# Patient Record
Sex: Female | Born: 1960 | Race: Black or African American | Hispanic: No | Marital: Married | Smoking: Never smoker
Health system: Southern US, Community
[De-identification: ages and names within clinical notes are randomized; demographics above are authoritative.]

## PROBLEM LIST (undated history)

## (undated) DIAGNOSIS — N809 Endometriosis, unspecified: Secondary | ICD-10-CM

## (undated) DIAGNOSIS — G629 Polyneuropathy, unspecified: Secondary | ICD-10-CM

## (undated) DIAGNOSIS — M47816 Spondylosis without myelopathy or radiculopathy, lumbar region: Secondary | ICD-10-CM

## (undated) DIAGNOSIS — M545 Low back pain, unspecified: Secondary | ICD-10-CM

## (undated) DIAGNOSIS — M797 Fibromyalgia: Secondary | ICD-10-CM

## (undated) DIAGNOSIS — R569 Unspecified convulsions: Secondary | ICD-10-CM

## (undated) DIAGNOSIS — M7918 Myalgia, other site: Secondary | ICD-10-CM

## (undated) DIAGNOSIS — T4145XA Adverse effect of unspecified anesthetic, initial encounter: Secondary | ICD-10-CM

## (undated) DIAGNOSIS — E079 Disorder of thyroid, unspecified: Secondary | ICD-10-CM

## (undated) DIAGNOSIS — K589 Irritable bowel syndrome without diarrhea: Secondary | ICD-10-CM

## (undated) DIAGNOSIS — G9332 Myalgic encephalomyelitis/chronic fatigue syndrome: Secondary | ICD-10-CM

## (undated) DIAGNOSIS — H16399 Other interstitial and deep keratitis, unspecified eye: Secondary | ICD-10-CM

## (undated) DIAGNOSIS — T8859XA Other complications of anesthesia, initial encounter: Secondary | ICD-10-CM

## (undated) DIAGNOSIS — N3010 Interstitial cystitis (chronic) without hematuria: Secondary | ICD-10-CM

## (undated) DIAGNOSIS — M51369 Other intervertebral disc degeneration, lumbar region without mention of lumbar back pain or lower extremity pain: Secondary | ICD-10-CM

## (undated) DIAGNOSIS — G8929 Other chronic pain: Secondary | ICD-10-CM

## (undated) DIAGNOSIS — G9050 Complex regional pain syndrome I, unspecified: Secondary | ICD-10-CM

## (undated) DIAGNOSIS — Z973 Presence of spectacles and contact lenses: Secondary | ICD-10-CM

## (undated) DIAGNOSIS — G90521 Complex regional pain syndrome I of right lower limb: Secondary | ICD-10-CM

## (undated) DIAGNOSIS — E039 Hypothyroidism, unspecified: Secondary | ICD-10-CM

## (undated) DIAGNOSIS — R5382 Chronic fatigue, unspecified: Secondary | ICD-10-CM

## (undated) DIAGNOSIS — Z8489 Family history of other specified conditions: Secondary | ICD-10-CM

## (undated) DIAGNOSIS — J45909 Unspecified asthma, uncomplicated: Secondary | ICD-10-CM

## (undated) DIAGNOSIS — G4730 Sleep apnea, unspecified: Secondary | ICD-10-CM

## (undated) DIAGNOSIS — I201 Angina pectoris with documented spasm: Secondary | ICD-10-CM

## (undated) DIAGNOSIS — M5136 Other intervertebral disc degeneration, lumbar region: Secondary | ICD-10-CM

## (undated) DIAGNOSIS — Z993 Dependence on wheelchair: Secondary | ICD-10-CM

## (undated) HISTORY — DX: Other chronic pain: G89.29

## (undated) HISTORY — DX: Myalgia, other site: M79.18

## (undated) HISTORY — DX: Sleep apnea, unspecified: G47.30

## (undated) HISTORY — DX: Other intervertebral disc degeneration, lumbar region: M51.36

## (undated) HISTORY — DX: Irritable bowel syndrome, unspecified: K58.9

## (undated) HISTORY — DX: Low back pain: M54.5

## (undated) HISTORY — PX: CARDIAC CATHETERIZATION: SHX172

## (undated) HISTORY — PX: KNEE SURGERY: SHX244

## (undated) HISTORY — DX: Other intervertebral disc degeneration, lumbar region without mention of lumbar back pain or lower extremity pain: M51.369

## (undated) HISTORY — PX: ABDOMINAL SURGERY: SHX537

## (undated) HISTORY — PX: JOINT REPLACEMENT: SHX530

## (undated) HISTORY — DX: Hypothyroidism, unspecified: E03.9

## (undated) HISTORY — DX: Low back pain, unspecified: M54.50

## (undated) HISTORY — DX: Spondylosis without myelopathy or radiculopathy, lumbar region: M47.816

## (undated) HISTORY — DX: Complex regional pain syndrome i of right lower limb: G90.521

## (undated) HISTORY — DX: Unspecified asthma, uncomplicated: J45.909

---
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
## 1981-12-13 HISTORY — PX: GYNECOLOGIC CRYOSURGERY: SHX857

## 1985-12-13 HISTORY — PX: CHOLECYSTECTOMY: SHX55

## 1988-12-13 HISTORY — PX: TUBAL LIGATION: SHX77

## 1989-12-13 HISTORY — PX: KNEE ARTHROSCOPY: SUR90

## 1990-12-13 HISTORY — PX: ABDOMINAL HYSTERECTOMY: SHX81

## 1995-12-14 DIAGNOSIS — M7918 Myalgia, other site: Secondary | ICD-10-CM

## 1995-12-14 DIAGNOSIS — M797 Fibromyalgia: Secondary | ICD-10-CM

## 1995-12-14 DIAGNOSIS — G8929 Other chronic pain: Secondary | ICD-10-CM

## 1995-12-14 HISTORY — PX: PUBOVAGINAL SLING SYNTHETIC: SUR1059

## 1995-12-14 HISTORY — DX: Myalgia, other site: M79.18

## 1995-12-14 HISTORY — PX: BLADDER SURGERY: SHX569

## 1995-12-14 HISTORY — DX: Other chronic pain: G89.29

## 1995-12-14 HISTORY — DX: Fibromyalgia: M79.7

## 1999-03-05 ENCOUNTER — Ambulatory Visit (HOSPITAL_COMMUNITY): Admission: RE | Admit: 1999-03-05 | Discharge: 1999-03-05 | Payer: Self-pay | Admitting: Internal Medicine

## 1999-07-08 ENCOUNTER — Encounter: Payer: Self-pay | Admitting: Urology

## 1999-07-10 ENCOUNTER — Observation Stay (HOSPITAL_COMMUNITY): Admission: RE | Admit: 1999-07-10 | Discharge: 1999-07-11 | Payer: Self-pay | Admitting: Urology

## 1999-11-18 ENCOUNTER — Encounter: Admission: RE | Admit: 1999-11-18 | Discharge: 1999-11-18 | Payer: Self-pay | Admitting: Internal Medicine

## 1999-11-18 ENCOUNTER — Encounter: Payer: Self-pay | Admitting: Internal Medicine

## 1999-12-14 HISTORY — PX: BUNIONECTOMY: SHX129

## 2000-01-25 ENCOUNTER — Encounter: Payer: Self-pay | Admitting: Orthopaedic Surgery

## 2000-01-25 ENCOUNTER — Encounter: Admission: RE | Admit: 2000-01-25 | Discharge: 2000-01-25 | Payer: Self-pay | Admitting: Orthopaedic Surgery

## 2000-01-26 ENCOUNTER — Ambulatory Visit (HOSPITAL_BASED_OUTPATIENT_CLINIC_OR_DEPARTMENT_OTHER): Admission: RE | Admit: 2000-01-26 | Discharge: 2000-01-26 | Payer: Self-pay | Admitting: Orthopaedic Surgery

## 2000-01-28 ENCOUNTER — Emergency Department (HOSPITAL_COMMUNITY): Admission: EM | Admit: 2000-01-28 | Discharge: 2000-01-28 | Payer: Self-pay | Admitting: Emergency Medicine

## 2000-02-14 ENCOUNTER — Ambulatory Visit: Admission: RE | Admit: 2000-02-14 | Discharge: 2000-02-14 | Payer: Self-pay | Admitting: Internal Medicine

## 2000-04-27 ENCOUNTER — Other Ambulatory Visit: Admission: RE | Admit: 2000-04-27 | Discharge: 2000-04-27 | Payer: Self-pay | Admitting: Obstetrics and Gynecology

## 2000-09-05 ENCOUNTER — Encounter: Payer: Self-pay | Admitting: Urology

## 2000-09-09 ENCOUNTER — Observation Stay (HOSPITAL_COMMUNITY): Admission: RE | Admit: 2000-09-09 | Discharge: 2000-09-10 | Payer: Self-pay | Admitting: Urology

## 2000-12-13 DIAGNOSIS — G90521 Complex regional pain syndrome I of right lower limb: Secondary | ICD-10-CM

## 2000-12-13 HISTORY — DX: Complex regional pain syndrome i of right lower limb: G90.521

## 2001-05-08 ENCOUNTER — Other Ambulatory Visit: Admission: RE | Admit: 2001-05-08 | Discharge: 2001-05-08 | Payer: Self-pay | Admitting: Obstetrics and Gynecology

## 2001-07-13 ENCOUNTER — Encounter: Payer: Self-pay | Admitting: Emergency Medicine

## 2001-07-13 ENCOUNTER — Emergency Department (HOSPITAL_COMMUNITY): Admission: EM | Admit: 2001-07-13 | Discharge: 2001-07-13 | Payer: Self-pay | Admitting: Emergency Medicine

## 2001-08-25 ENCOUNTER — Encounter: Admission: RE | Admit: 2001-08-25 | Discharge: 2001-11-23 | Payer: Self-pay | Admitting: Orthopaedic Surgery

## 2002-08-13 ENCOUNTER — Emergency Department (HOSPITAL_COMMUNITY): Admission: EM | Admit: 2002-08-13 | Discharge: 2002-08-13 | Payer: Self-pay | Admitting: *Deleted

## 2002-10-01 ENCOUNTER — Other Ambulatory Visit: Admission: RE | Admit: 2002-10-01 | Discharge: 2002-10-01 | Payer: Self-pay | Admitting: Obstetrics and Gynecology

## 2002-10-10 ENCOUNTER — Encounter: Payer: Self-pay | Admitting: Obstetrics and Gynecology

## 2002-10-10 ENCOUNTER — Ambulatory Visit (HOSPITAL_COMMUNITY): Admission: RE | Admit: 2002-10-10 | Discharge: 2002-10-10 | Payer: Self-pay | Admitting: Obstetrics and Gynecology

## 2002-10-19 ENCOUNTER — Inpatient Hospital Stay (HOSPITAL_COMMUNITY): Admission: AD | Admit: 2002-10-19 | Discharge: 2002-10-21 | Payer: Self-pay | Admitting: Internal Medicine

## 2002-10-19 ENCOUNTER — Encounter: Payer: Self-pay | Admitting: Internal Medicine

## 2002-10-22 ENCOUNTER — Inpatient Hospital Stay (HOSPITAL_COMMUNITY): Admission: AD | Admit: 2002-10-22 | Discharge: 2002-10-30 | Payer: Self-pay | Admitting: Internal Medicine

## 2002-10-23 ENCOUNTER — Encounter: Payer: Self-pay | Admitting: Internal Medicine

## 2002-10-23 ENCOUNTER — Encounter (INDEPENDENT_AMBULATORY_CARE_PROVIDER_SITE_OTHER): Payer: Self-pay | Admitting: Cardiology

## 2002-10-24 ENCOUNTER — Encounter: Payer: Self-pay | Admitting: Internal Medicine

## 2002-10-29 ENCOUNTER — Encounter: Payer: Self-pay | Admitting: Internal Medicine

## 2002-11-06 ENCOUNTER — Encounter: Admission: RE | Admit: 2002-11-06 | Discharge: 2002-11-06 | Payer: Self-pay | Admitting: Internal Medicine

## 2002-11-06 ENCOUNTER — Encounter: Payer: Self-pay | Admitting: Internal Medicine

## 2002-11-19 ENCOUNTER — Inpatient Hospital Stay (HOSPITAL_COMMUNITY): Admission: EM | Admit: 2002-11-19 | Discharge: 2002-11-30 | Payer: Self-pay | Admitting: Emergency Medicine

## 2002-11-19 ENCOUNTER — Encounter: Payer: Self-pay | Admitting: Internal Medicine

## 2003-01-14 ENCOUNTER — Inpatient Hospital Stay (HOSPITAL_COMMUNITY): Admission: EM | Admit: 2003-01-14 | Discharge: 2003-01-22 | Payer: Self-pay | Admitting: Emergency Medicine

## 2003-01-14 ENCOUNTER — Encounter: Payer: Self-pay | Admitting: Emergency Medicine

## 2003-05-30 ENCOUNTER — Ambulatory Visit (HOSPITAL_BASED_OUTPATIENT_CLINIC_OR_DEPARTMENT_OTHER): Admission: RE | Admit: 2003-05-30 | Discharge: 2003-05-30 | Payer: Self-pay | Admitting: Urology

## 2003-06-07 ENCOUNTER — Observation Stay (HOSPITAL_COMMUNITY): Admission: RE | Admit: 2003-06-07 | Discharge: 2003-06-08 | Payer: Self-pay | Admitting: Urology

## 2004-02-14 ENCOUNTER — Emergency Department (HOSPITAL_COMMUNITY): Admission: EM | Admit: 2004-02-14 | Discharge: 2004-02-14 | Payer: Self-pay | Admitting: Emergency Medicine

## 2005-01-01 ENCOUNTER — Ambulatory Visit (HOSPITAL_COMMUNITY): Admission: RE | Admit: 2005-01-01 | Discharge: 2005-01-01 | Payer: Self-pay | Admitting: Urology

## 2005-01-01 ENCOUNTER — Observation Stay (HOSPITAL_COMMUNITY): Admission: AD | Admit: 2005-01-01 | Discharge: 2005-01-02 | Payer: Self-pay | Admitting: Urology

## 2005-01-01 ENCOUNTER — Ambulatory Visit (HOSPITAL_BASED_OUTPATIENT_CLINIC_OR_DEPARTMENT_OTHER): Admission: RE | Admit: 2005-01-01 | Discharge: 2005-01-01 | Payer: Self-pay | Admitting: Urology

## 2005-01-28 ENCOUNTER — Ambulatory Visit: Payer: Self-pay | Admitting: Internal Medicine

## 2005-02-09 ENCOUNTER — Ambulatory Visit (HOSPITAL_COMMUNITY): Admission: RE | Admit: 2005-02-09 | Discharge: 2005-02-09 | Payer: Self-pay | Admitting: Obstetrics and Gynecology

## 2005-02-15 ENCOUNTER — Ambulatory Visit: Payer: Self-pay | Admitting: Internal Medicine

## 2005-07-29 ENCOUNTER — Ambulatory Visit: Payer: Self-pay | Admitting: Internal Medicine

## 2005-12-27 ENCOUNTER — Ambulatory Visit: Payer: Self-pay | Admitting: Internal Medicine

## 2006-02-11 ENCOUNTER — Ambulatory Visit (HOSPITAL_COMMUNITY): Admission: RE | Admit: 2006-02-11 | Discharge: 2006-02-11 | Payer: Self-pay | Admitting: Obstetrics and Gynecology

## 2006-04-28 ENCOUNTER — Ambulatory Visit: Payer: Self-pay | Admitting: Internal Medicine

## 2006-08-19 ENCOUNTER — Observation Stay (HOSPITAL_COMMUNITY): Admission: EM | Admit: 2006-08-19 | Discharge: 2006-08-20 | Payer: Self-pay | Admitting: Urology

## 2006-10-25 ENCOUNTER — Ambulatory Visit: Payer: Self-pay | Admitting: Internal Medicine

## 2007-04-27 ENCOUNTER — Ambulatory Visit: Payer: Self-pay | Admitting: Internal Medicine

## 2007-04-28 ENCOUNTER — Ambulatory Visit: Payer: Self-pay | Admitting: Internal Medicine

## 2007-09-27 ENCOUNTER — Ambulatory Visit (HOSPITAL_COMMUNITY): Admission: RE | Admit: 2007-09-27 | Discharge: 2007-09-27 | Payer: Self-pay | Admitting: Obstetrics and Gynecology

## 2007-11-02 DIAGNOSIS — K219 Gastro-esophageal reflux disease without esophagitis: Secondary | ICD-10-CM | POA: Insufficient documentation

## 2007-11-02 DIAGNOSIS — J383 Other diseases of vocal cords: Secondary | ICD-10-CM

## 2007-11-02 DIAGNOSIS — F411 Generalized anxiety disorder: Secondary | ICD-10-CM | POA: Insufficient documentation

## 2007-11-02 DIAGNOSIS — J45909 Unspecified asthma, uncomplicated: Secondary | ICD-10-CM | POA: Insufficient documentation

## 2007-11-02 DIAGNOSIS — R0789 Other chest pain: Secondary | ICD-10-CM

## 2007-11-03 ENCOUNTER — Ambulatory Visit (HOSPITAL_BASED_OUTPATIENT_CLINIC_OR_DEPARTMENT_OTHER): Admission: RE | Admit: 2007-11-03 | Discharge: 2007-11-03 | Payer: Self-pay | Admitting: Urology

## 2007-12-13 ENCOUNTER — Ambulatory Visit: Payer: Self-pay | Admitting: Internal Medicine

## 2007-12-13 DIAGNOSIS — L272 Dermatitis due to ingested food: Secondary | ICD-10-CM | POA: Insufficient documentation

## 2007-12-28 ENCOUNTER — Ambulatory Visit: Payer: Self-pay | Admitting: Internal Medicine

## 2008-03-21 ENCOUNTER — Ambulatory Visit (HOSPITAL_COMMUNITY): Admission: RE | Admit: 2008-03-21 | Discharge: 2008-03-21 | Payer: Self-pay | Admitting: Gastroenterology

## 2008-06-06 ENCOUNTER — Ambulatory Visit: Payer: Self-pay | Admitting: Internal Medicine

## 2008-06-18 ENCOUNTER — Encounter (INDEPENDENT_AMBULATORY_CARE_PROVIDER_SITE_OTHER): Payer: Self-pay | Admitting: *Deleted

## 2008-08-31 ENCOUNTER — Emergency Department (HOSPITAL_COMMUNITY): Admission: EM | Admit: 2008-08-31 | Discharge: 2008-08-31 | Payer: Self-pay | Admitting: Emergency Medicine

## 2008-09-05 ENCOUNTER — Ambulatory Visit (HOSPITAL_COMMUNITY): Admission: RE | Admit: 2008-09-05 | Discharge: 2008-09-05 | Payer: Self-pay | Admitting: Internal Medicine

## 2008-09-18 ENCOUNTER — Ambulatory Visit (HOSPITAL_COMMUNITY): Admission: RE | Admit: 2008-09-18 | Discharge: 2008-09-18 | Payer: Self-pay | Admitting: Gastroenterology

## 2008-09-27 ENCOUNTER — Inpatient Hospital Stay (HOSPITAL_COMMUNITY): Admission: AD | Admit: 2008-09-27 | Discharge: 2008-10-05 | Payer: Self-pay | Admitting: Internal Medicine

## 2008-09-27 ENCOUNTER — Encounter: Payer: Self-pay | Admitting: Internal Medicine

## 2008-09-30 ENCOUNTER — Encounter: Payer: Self-pay | Admitting: Internal Medicine

## 2008-10-02 ENCOUNTER — Ambulatory Visit (HOSPITAL_COMMUNITY): Admission: RE | Admit: 2008-10-02 | Discharge: 2008-10-02 | Payer: Self-pay | Admitting: Cardiology

## 2008-10-15 ENCOUNTER — Encounter: Admission: RE | Admit: 2008-10-15 | Discharge: 2008-10-15 | Payer: Self-pay | Admitting: Gastroenterology

## 2008-11-22 ENCOUNTER — Ambulatory Visit: Payer: Self-pay | Admitting: Internal Medicine

## 2008-12-04 ENCOUNTER — Encounter (HOSPITAL_COMMUNITY): Admission: RE | Admit: 2008-12-04 | Discharge: 2008-12-12 | Payer: Self-pay | Admitting: Internal Medicine

## 2008-12-13 ENCOUNTER — Encounter: Payer: Self-pay | Admitting: Internal Medicine

## 2008-12-13 ENCOUNTER — Encounter (HOSPITAL_COMMUNITY): Admission: RE | Admit: 2008-12-13 | Discharge: 2008-12-19 | Payer: Self-pay | Admitting: Internal Medicine

## 2008-12-19 ENCOUNTER — Emergency Department (HOSPITAL_COMMUNITY): Admission: EM | Admit: 2008-12-19 | Discharge: 2008-12-19 | Payer: Self-pay | Admitting: Emergency Medicine

## 2008-12-21 ENCOUNTER — Inpatient Hospital Stay (HOSPITAL_COMMUNITY): Admission: EM | Admit: 2008-12-21 | Discharge: 2008-12-26 | Payer: Self-pay | Admitting: Emergency Medicine

## 2009-06-05 ENCOUNTER — Ambulatory Visit: Payer: Self-pay | Admitting: Internal Medicine

## 2010-01-09 ENCOUNTER — Emergency Department (HOSPITAL_COMMUNITY): Admission: EM | Admit: 2010-01-09 | Discharge: 2010-01-09 | Payer: Self-pay | Admitting: Emergency Medicine

## 2011-01-02 ENCOUNTER — Encounter: Payer: Self-pay | Admitting: Obstetrics and Gynecology

## 2011-02-28 LAB — BASIC METABOLIC PANEL
Calcium: 8.9 mg/dL (ref 8.4–10.5)
GFR calc Af Amer: >60 mL/min (ref >60)
GFR calc non Af Amer: >60 mL/min (ref >60)
Glucose, Bld: 154 mg/dL — ABNORMAL HIGH (ref 70–99)
Potassium: 3.5 mEq/L (ref 3.5–5.1)

## 2011-03-24 ENCOUNTER — Encounter: Payer: Self-pay | Admitting: Internal Medicine

## 2011-03-29 LAB — GLUCOSE, CAPILLARY
Glucose-Capillary: 101 mg/dL — ABNORMAL HIGH (ref 70–99)
Glucose-Capillary: 106 mg/dL — ABNORMAL HIGH (ref 70–99)
Glucose-Capillary: 107 mg/dL — ABNORMAL HIGH (ref 70–99)
Glucose-Capillary: 112 mg/dL — ABNORMAL HIGH (ref 70–99)
Glucose-Capillary: 118 mg/dL — ABNORMAL HIGH (ref 70–99)
Glucose-Capillary: 123 mg/dL — ABNORMAL HIGH (ref 70–99)
Glucose-Capillary: 131 mg/dL — ABNORMAL HIGH (ref 70–99)
Glucose-Capillary: 149 mg/dL — ABNORMAL HIGH (ref 70–99)
Glucose-Capillary: 204 mg/dL — ABNORMAL HIGH (ref 70–99)
Glucose-Capillary: 92 mg/dL (ref 70–99)

## 2011-03-29 LAB — URINALYSIS, ROUTINE W REFLEX MICROSCOPIC
Glucose, UA: NEGATIVE mg/dL
Hgb urine dipstick: NEGATIVE
Ketones, ur: NEGATIVE mg/dL
Protein, ur: NEGATIVE mg/dL
Urobilinogen, UA: 1 mg/dL (ref 0.0–1.0)

## 2011-03-29 LAB — BASIC METABOLIC PANEL
BUN: 2 mg/dL — ABNORMAL LOW (ref 6–23)
CO2: 32 mEq/L (ref 19–32)
Calcium: 9 mg/dL (ref 8.4–10.5)
Calcium: 9.3 mg/dL (ref 8.4–10.5)
Chloride: 101 mEq/L (ref 96–112)
Creatinine, Ser: 1.03 mg/dL (ref 0.4–1.2)
GFR calc Af Amer: >60 mL/min (ref >60)
GFR calc Af Amer: >60 mL/min (ref >60)
GFR calc non Af Amer: 57 mL/min — ABNORMAL LOW (ref >60)
Glucose, Bld: 88 mg/dL (ref 70–99)
Sodium: 139 mEq/L (ref 135–145)
Sodium: 133 mEq/L — ABNORMAL LOW (ref 135–145)

## 2011-03-29 LAB — CARDIAC PANEL(CRET KIN+CKTOT+MB+TROPI)
CK, MB: 1 ng/mL (ref 0.3–4.0)
Relative Index: INVALID (ref 0.0–2.5)
Total CK: 54 U/L (ref 7–177)
Troponin I: <0.01 ng/mL (ref 0.00–0.06)

## 2011-03-29 LAB — DIFFERENTIAL
Basophils Absolute: 0.3 10*3/uL — ABNORMAL HIGH (ref 0.0–0.1)
Basophils Relative: 0 % (ref 0–1)
Basophils Relative: 3 % — ABNORMAL HIGH (ref 0–1)
Eosinophils Absolute: 0.2 10*3/uL (ref 0.0–0.7)
Eosinophils Relative: 2 % (ref 0–5)
Lymphocytes Relative: 25 % (ref 12–46)
Lymphocytes Relative: 29 % (ref 12–46)
Lymphs Abs: 2.1 10*3/uL (ref 0.7–4.0)
Monocytes Absolute: 1 10*3/uL (ref 0.1–1.0)
Monocytes Absolute: 1.1 10*3/uL — ABNORMAL HIGH (ref 0.1–1.0)
Monocytes Relative: 9 % (ref 3–12)
Monocytes Relative: 10 % (ref 3–12)
Monocytes Relative: 8 % (ref 3–12)
Neutro Abs: 6.4 10*3/uL (ref 1.7–7.7)
Neutro Abs: 6.4 10*3/uL (ref 1.7–7.7)
Neutrophils Relative %: 63 % (ref 43–77)
Neutrophils Relative %: 59 % (ref 43–77)

## 2011-03-29 LAB — COMPREHENSIVE METABOLIC PANEL
ALT: 20 U/L (ref 0–35)
ALT: 21 U/L (ref 0–35)
AST: 21 U/L (ref 0–37)
Albumin: 3.1 g/dL — ABNORMAL LOW (ref 3.5–5.2)
Albumin: 3.2 g/dL — ABNORMAL LOW (ref 3.5–5.2)
Alkaline Phosphatase: 72 U/L (ref 39–117)
Alkaline Phosphatase: 73 U/L (ref 39–117)
Alkaline Phosphatase: 90 U/L (ref 39–117)
BUN: 4 mg/dL — ABNORMAL LOW (ref 6–23)
BUN: 5 mg/dL — ABNORMAL LOW (ref 6–23)
BUN: 7 mg/dL (ref 6–23)
CO2: 29 mEq/L (ref 19–32)
CO2: 31 mEq/L (ref 19–32)
Calcium: 8.6 mg/dL (ref 8.4–10.5)
Calcium: 8.8 mg/dL (ref 8.4–10.5)
Calcium: 8.9 mg/dL (ref 8.4–10.5)
Chloride: 102 mEq/L (ref 96–112)
Creatinine, Ser: 0.77 mg/dL (ref 0.4–1.2)
Creatinine, Ser: 0.87 mg/dL (ref 0.4–1.2)
Creatinine, Ser: 0.83 mg/dL (ref 0.4–1.2)
GFR calc Af Amer: >60 mL/min (ref >60)
GFR calc Af Amer: >60 mL/min (ref >60)
GFR calc non Af Amer: >60 mL/min (ref >60)
GFR calc non Af Amer: >60 mL/min (ref >60)
Glucose, Bld: 104 mg/dL — ABNORMAL HIGH (ref 70–99)
Glucose, Bld: 111 mg/dL — ABNORMAL HIGH (ref 70–99)
Glucose, Bld: 97 mg/dL (ref 70–99)
Potassium: 3.6 mEq/L (ref 3.5–5.1)
Sodium: 137 mEq/L (ref 135–145)
Sodium: 139 mEq/L (ref 135–145)
Total Bilirubin: 0.5 mg/dL (ref 0.3–1.2)
Total Bilirubin: 0.6 mg/dL (ref 0.3–1.2)
Total Protein: 6.4 g/dL (ref 6.0–8.3)
Total Protein: 6.4 g/dL (ref 6.0–8.3)

## 2011-03-29 LAB — CBC
HCT: 40.6 % (ref 36.0–46.0)
Hemoglobin: 13 g/dL (ref 12.0–15.0)
Hemoglobin: 14.4 g/dL (ref 12.0–15.0)
MCHC: 31.5 g/dL (ref 30.0–36.0)
MCHC: 32.1 g/dL (ref 30.0–36.0)
MCV: 80.4 fL (ref 78.0–100.0)
MCV: 80.6 fL (ref 78.0–100.0)
Platelets: 313 10*3/uL (ref 150–400)
Platelets: 382 10*3/uL (ref 150–400)
RDW: 14.3 % (ref 11.5–15.5)
RDW: 14.9 % (ref 11.5–15.5)

## 2011-03-29 LAB — POCT CARDIAC MARKERS
CKMB, poc: <1 ng/mL — ABNORMAL LOW (ref 1.0–8.0)
Myoglobin, poc: 39.4 ng/mL (ref 12–200)

## 2011-03-29 LAB — C-REACTIVE PROTEIN: CRP: 3.5 mg/dL — ABNORMAL HIGH (ref <0.6)

## 2011-03-29 LAB — MAGNESIUM: Magnesium: 2 mg/dL (ref 1.5–2.5)

## 2011-03-29 LAB — APTT: aPTT: 29 seconds (ref 24–37)

## 2011-03-29 LAB — ANA: Anti Nuclear Antibody(ANA): NEGATIVE

## 2011-03-29 LAB — C3 COMPLEMENT: C3 Complement: 155 mg/dL (ref 88–201)

## 2011-04-27 NOTE — Procedures (Signed)
EEG NUMBER:  [id number].   REQUESTING PHYSICIAN:  Eric L. August Saucer, M.D.   CURRENT HISTORY:  A 50 year old woman with episode of syncope with body  jerking.  EEG is performed for evaluation.  The patient described as  awake and drowsy.  This is a routine EEG done without hyperventilation  due to asthma, and with limited photic stimulation.   DESCRIPTION:  The dominant rhythm of this tracing is a moderate  amplitude alpha rhythm of 9-10, Hz which predominates posteriorly,  appears without abnormal asymmetry, attenuates with eye opening and  closing.  Low amplitude fast activity is seen frontally and centrally  and appears without abnormal asymmetry.  No focal slowing is noted and  no epileptiform discharges are seen.  Drowsiness occurs naturally  towards at the end of the recording, as evidenced by fragmentation of  the background and generalized attenuation of rhythms.  No abnormalities  seen in drowsiness.  Photic stimulation produced symmetric driving  responses, as long as the patient could tolerate it.  Hyperventilation  was not performed.  Single channel devoted EKG revealed sinus  tachycardia throughout with a rate of approximately 108 beats per  minute.   CONCLUSIONS:  Normal study in awake and lightly drowsy states.  Incidental note is made of sinus tachycardia      Michael L. Thad Ranger, M.D.  Electronically Signed     [id number]  D:  09/06/2008 02:03:42  T:  09/06/2008 04:15:45  Job #:  [id number]

## 2011-04-27 NOTE — H&P (Signed)
Crystal [name], Crystal [name]                ACCOUNT NO.:  [id number]   MEDICAL RECORD NO.:  [id number]          PATIENT TYPE:  INP   LOCATION:  1521                         FACILITY:  [facility]   PHYSICIAN:  Eric L. August Saucer, M.D.     DATE OF BIRTH:  12/07/1961   DATE OF ADMISSION:  12/21/2008  DATE OF DISCHARGE:                              HISTORY & PHYSICAL   CHIEF COMPLAINT:  Recurring jerking spells, rule out seizure disorder  and presyncope.   HISTORY OF PRESENT ILLNESS:  This was the second recent [facility] admission for this 50 year old married black female with a  history of chronic hypoventilation syndrome, fibromyalgia morbid  obesity, recurring asthma and chronic fatigue syndrome.  She had been  doing only moderately well until January 7.  After having been in the  hospital in November for evaluation of progressive weakness and with  associated shortness of breath with no clear etiology being found, the  patient had recently started pulmonary rehab.  On January 7 while  undergoing an exercise routine at the pulmonary rehab lab, the patient  was noted to become weak and suddenly slumped over after sitting down.  She thereafter had a jerking spell lasting for 45 seconds.  She  thereafter was questionably unresponsive thereafter.  She was taken to  the emergency room for further evaluation.  No clear etiology was found  at that time.  It was felt that the patient may have had a  pseudoseizure.  She was subsequently discharged home for follow-up.   Since that time the patient has had recurrent episodes of becoming  slightly confused followed by a tightness in the chest.  Husband notes  that she begins jerking mainly in her chest region, but oftentimes her  arms may be flaccid.  She has not fallen since her episode in the  pulmonary lab.  There is no associated tongue-biting, urine or fecal  incontinence.  The patient notably is not unconscious.  She states that  she is able to  hear voices but not able to respond.  She does not have a  significant postictal state.  She may feel drowsy but does recall the  events.   The patient was observed in the emergency room today by EDP with the  episode.  It could not be separated whether these were true seizures  versus pseudoseizures.  Because of recent increased frequency of  episodes patient is admitted at this time for further evaluation.   Her history is complicated by significant stressors.  This had reached a  climax on January 4, i.e. 3 days prior to her initial event.  She  acknowledges that she had been under increased stress, but had begun to  feel better thereafter.  The patient also notes that these episodes in  the past have been stimulated by exertion physically as well as  emotional stressors.  Husband notes that she had a similar episode with  observing flashing lights in the dark.  There has been no past history  of seizure activity.  No family history of seizure activity.  PAST MEDICAL HISTORY:  Well documented in old records.  She has chronic  severe fibromyalgia for which she had been on multiple medication  regimens.  This was most recently simplified during her admission in  October November of 2009.  She has ongoing morbid obesity, diabetes  mellitus, history of significant depression, noncardiac chest pain, and  vertigo.   PAST MEDICAL HISTORY:  Otherwise notable for chronic interstitial  cystitis, distant history of Prinzmetal's angina. Fibromyalgia as noted,  history of irritable bowel syndrome with constipation predominant.  Past  history of endometriosis.  History of hiatal hernia.  History of  hypothyroidism.  She has had rectal bleeding in October of this past  year secondary to internal hemorrhoids.   The patient is status post cholecystectomy.  Status post tubal ligation  in 1990.  Status post left knee arthroscopy. Status post hysterectomy.   History is also significant for having  a pseudocholinesterase deficiency  syndrome.  She had previously been followed by Dr. Iran Sizer for her  fibromyalgia.  She continues to be followed by Dr. Maple Hudson for pulmonary  medicine and Dr. Bosie Clos for GI symptoms in the past.   ALLERGIES:  The patient is allergic to SULFA, NSAIDS, MORPHINE.  She has  allergies to RAGWEED and DUST.   PRESENT MEDICATIONS:  Glucophage 500 mg 30 mg b.i.d. Armour Thyroid 30  mg daily.  Methadone 10 mg q.i.d. Flexeril 10 mg  q.i.d., metoprolol 25  mg 1/2 tablet daily, Valtrex 500 mg 2 tablets daily, metformin 500 mg  b.i.d. diltiazem CD 240 mg daily, Klor-Con 10 mEq 2 tablets daily.  Elmiron 100 mg 2 tablets b.i.d., Synthroid 50 mcg daily, Colon Cleanse 1  capsule four times a day, CoQ10 100 mg daily, adenosine with vitamin B12  injections weekly.   The patient also has had Midrin protocol for migraine headaches.  Diazepam 5 mg q.i.d. p.r.n. anxiety.  Ventolin and DuoNeb inhalers as  needed for asthma flare-ups.   PHYSICAL EXAM:  GENERAL APPEARANCE:  She is a well-developed, obese  black female, somewhat somnolent although in no acute distress.  VITAL SIGNS:  Blood pressure of 127/85, pulse of 96, respiratory rate  16, temperature 98.  O2 sats 96% on room air.  Height 5 feet 8 inches,  weight 140.3 kg.  HEENT: Head normocephalic, atraumatic without bruits.  Extraocular  muscles are intact.  Pupils equal and reactive.  There is no sinus  tenderness.  TMs with decreased light reflex without erythematous  changes.  Throat: Posterior pharynx clear.  NECK: Supple.  No enlarged thyroid.  No positive cervical nodes.  LUNGS:  Diminished breath sounds at bases.  No wheezes.  No E:A changes.  No CVA tenderness.  CARDIOVASCULAR:  Normal S1-S2.  No S3.  No S4.  She  has a 1/6 systolic ejection murmur at the lower left sternal border.  ABDOMEN: Obese with dullness to percussion in the lower quadrants.  No  tenderness appreciated.  MUSCULOSKELETAL:  Full range  of motion of upper  extremities.  Minimal crepitus in the knees.  Notably negative Homan's.  No edema.  Pulses are 1+ bilaterally.  SKIN:  Without active lesions.  NEUROLOGIC: The patient is awake although slightly lethargic.  She is  oriented to person, place and time.  She has flat affect with a  depressed mood.  Her extraocular muscles are intact.  Negative drift.  Strength is 4/5 throughout on direct confrontation.  Negative frontal  release signs.  Absent Babinski's on the right,  equivalent on the left.  No other pathological reflexes are noted.  PSYCHIATRIC:  Flat affect with depressed mood.  Acknowledges ongoing  stressors which are presently better at this time. Denies suicidal  ideations.  Hopeful for improvement of her marital relations.   LABORATORY DATA:  CBC revealed WBC of 8100, hemoglobin of 12.9,  hematocrit of 39.5.  Normal differential.   Sodium 139, potassium 4.3, chloride 101, CO2 of 32, BUN 0106, creatinine  of 0.88, calcium 9.   CT scan was not repeated from her previous study on January 7.  It  showed a small amount of left peripheral  cerebellar palpitation.  Otherwise normal examination.   IMPRESSION:  1. Progressive jerking spells with altered sensorium.  Rule out simple      partial seizures.  The patient by history is not experiencing grand-      mal seizures.  She does not describe a definite postictal loss of      consciousness.  No altered memory function during this time as      well.  She does have suggestion of these episodes being stimulated      with exertion, motion and possible photic stimulation as well.  She      had a previously normal EEG.  She has not had a sleep-deprived EEG.      Will need to review her previous MRI scan which did not show any      definite abnormalities to explain her original symptoms.  Cannot      exclude atypical migraine variant with neurologic  complications      rule out other.  2. Fibromyalgia, severe with  chronic pain medication use.  This had      previously been managed by Dr. Elvin So with intermittent injections.      She had most recently been managed with methadone for chronic pain      syndrome.  3. Morbid obesity.  4. Situational stress, severe.  5. Hyperventilation syndrome.  6. Diabetes mellitus.  7. Interstitial cystitis.  8. Chronic deconditioning, multifactorial.  9. Polypharmacy, repeatedly addressed.   PLAN:  The patient is admitted this time for further observation and  evaluation.  Will monitor the patient on telemetry with her next  episodes.  Will plan to obtain a sleep deprived EEG for further  evaluation.  Neurological consultation as well.  Will for now start low-  dose Tegretol under close observation.  Will review previous MRI scan as  well.  Further therapy pending response to the above.  Consideration for  a psychiatric evaluation pending results of above.  Unclear at this time  if this will be of benefit.           ______________________________  Lind Guest. August Saucer, M.D.     ELD/MEDQ  D:  12/21/2008  T:  12/22/2008  Job:  [id number]

## 2011-04-27 NOTE — Procedures (Signed)
EEG NUMBER:  [id number].   TECHNICIAN:  GQ.   PHYSICIAN:  Dr. Roseanne Reno.   This is a portable EEG record for a patient who is currently in 1521  room at [facility].  A 50 year old female admitted for  seizures versus pseudoseizures, jerking spells, morbidly obese with a  history of chronic hypoventilation syndrome secondary to obesity,  narcotic use, fibromyalgia.  She also has recurrent asthma and described  herself as having chronic fatigue syndrome.  She is diabetic.   Medications include methadone, Toprol, Valtrex, Glucophage, diltiazem,  potassium chloride, levothyroxine, Elmiron, Tegretol, Lovenox, Keppra,  Nitrostat, Valium, Flexeril, and Ativan.   The patient is lethargic and described as a right-handed individual  looking older than her numeric age.   The patient's husband is in the room during the EEG recording which is  normally not our protocol.  The description is that of an EKG showing of  a borderline sinus tachycardia 88-98 beats with regular R to R intervals  and some pulse artifacts that are transmitted throughout the temporal  left leads.  The patient was briefly aroused and shows bitemporal  dominant activity with 9 Hz, but a posterior dominant activity could not  be obtained until the very end of this recording.  There is no seizure  activity noted, no phase reversal, and no focal slowing.  The EEG  basically is documenting drowsiness, sleep.  The patient is even  described as snoring during much of the recording.  Sleep architecture  is symmetric and shows normal REM sleep stages II.  The posterior  dominant rhythm is briefly seen at 9 Hz for less than 4 seconds before  the patient drifted asleep again.  No evidence of seizure activity,  postictal focal slowing.  This is a normal EEG for a lethargic and  sleepy patient.      Melvyn Novas, M.D.  Electronically Signed     [id number]  D:  12/24/2008 09:11:50  T:  12/24/2008 21:26:38  Job #:   [id number]

## 2011-04-27 NOTE — Op Note (Signed)
Crystal [name], Crystal [name]                ACCOUNT NO.:  [id number]   MEDICAL RECORD NO.:  [id number]          PATIENT TYPE:  AMB   LOCATION:  [facility]                         FACILITY:  [facility]   PHYSICIAN:  Jamison Neighbor, M.D.  DATE OF BIRTH:  1961-01-25   DATE OF PROCEDURE:  11/03/2007  DATE OF DISCHARGE:                               OPERATIVE REPORT   PREOPERATIVE DIAGNOSES:  1. Interstitial cystitis/painful bladder syndrome.  2. Urgency incontinence.   POSTOPERATIVE DIAGNOSES:  1. Interstitial cystitis/painful bladder syndrome.  2. Urgency incontinence.   PROCEDURES:  1. Cystoscopy.  2. Urethral calibration.  3. Hydrodistention of the bladder.  4. Botox injections x2.  5. Marcaine and Pyridium instillation.  6. Marcaine and Kenalog injection.   SURGEON:  Jamison Neighbor, M.D.   ANESTHESIA:  General.   COMPLICATIONS:  None.   DRAINS:  None.   BRIEF HISTORY:  This 50 year old female is known to have interstitial  cystitis/painful bladder syndrome and has had worsening problems with  urgency and frequency.  The patient has failed to respond to oral  therapy and has requested that a hydrodistention be done.  She has  always had an excellent response to this.  She has asked that we include  a Botox injection because she has had worsening problems with urinary  control due to urgency and frequency.  The patient understands that  there is a risk that she may end up having problems with urinary  retention but agrees that she will do self-catheterization and/or use a  Foley catheter until that resolves.  She gave full informed consent.   PROCEDURE:  After successful induction of general anesthesia, the  patient was placed in the dorsal lithotomy position and prepped with  Betadine and draped in the usual sterile fashion.  A careful bimanual  examination reveals the patient has developed a modest cystocele.  She  has a midline defect.  She does not have much in the way of an  enterocele but does also have a little bit of laxity posteriorly  suggestive of a rectocele.  The patient is not actually symptomatic from  these and these do not need to be corrected at this time.  The urethra  was calibrated at 32-French with female urethral sounds with no evidence  of stenosis or stricture.  The cystoscope was inserted and the bladder  was carefully inspected.  No tumors or stones could be seen.  Both  ureteral orifices were normal in configuration and location.  Hydrodistention of the bladder was performed.  The bladder was distended  at a pressure of 100 cmH2O for 5 minutes.  When the bladder was drained,  modest glomerulations could be seen but certainly she did not have  widespread ulcer formation.  She also had a pretty normal bladder  capacity at 1000 mL.  The patient underwent injection of Botox 2 ampules  delivered submucosally throughout the bladder, including the trigone.  The patient tolerated this without difficulty.  She was taken to the  recovery room in good condition.  She did receive a Marcaine and  Pyridium instillation, Marcaine  was injected periurethrally, and she  also had a B&O suppository.  She was sent home with and Pyridium Plus  and doxycycline, to return to see me in 3 weeks' time.      Jamison Neighbor, M.D.  Electronically Signed     RJE/MEDQ  D:  11/03/2007  T:  11/03/2007  Job:  [id number]

## 2011-04-27 NOTE — Consult Note (Signed)
NAMEJACOBY, RITSEMA NO.:  [id number]   MEDICAL RECORD NO.:  [id number]          PATIENT TYPE:  INP   LOCATION:  1521                         FACILITY:  [facility]   PHYSICIAN:  Antonietta Breach, M.D.  DATE OF BIRTH:  08/17/1961   DATE OF CONSULTATION:  12/25/2008  DATE OF DISCHARGE:                                 CONSULTATION   REASON FOR CONSULTATION:  Neurologic manifestations without evidence of  organic etiology.   HISTORY OF PRESENT ILLNESS:  Mrs. Skyra Crichlow is a 50 year old female,  admitted to the [facility] on December 21, 2008 with convulsive  symptoms.   She has undergone a neurological organic workup which is negative.  She  does have decreased energy.  She has no other symptoms of depression.  She describes normal interests and constructive future goals.  She has  no thoughts of harming herself or others.  She has no delusions or  hallucinations.  Her orientation and memory function are intact.   She does describe a number of severe stresses that have occurred over  the past year.  She discovered that her son had been unfaithful and this  had resulted in his mistress becoming pregnant.  The patient's daughter-  in-law had become pregnant around the same time.  As the year progressed  the daughter-in-law moved in with the patient.  The daughter-in-law and  the patient's son's mistress both had their babies within a week of each  other in November.   Mrs. Revels husband has been unfaithful and, although this has been a  chronic problem, there has been further discussion about separation.   Mrs. Hofmeister sister-in-law has developed multiple sites of astrocytoma.  Mrs. Danziger received this news in July.   Also Mrs. Calzada has two children in their 4s.  One is a female who  required separation from [employer] due to insubordination, as well  as medical problems.  The other is a female, who has been seeking  employment as a Emergency planning/management officer.   He was turned down from the [employer].  He has been engaging in the above infidelity, as  mentioned.   Mrs. Bittinger does describe as her number one stress her somatic  manifestations.  She then goes on to discuss her stresses as above.  She  describes them with great frustration.  However, she is very calm,  without significant voice inflexion.  She does have occasional tears  that are appropriate to content.   Her neurologic presentation upon this hospitalization involved a  slumping over and then a jerking, lasting approximately 45 seconds.  There was possibility of an unresponsive period after.  That was on  January 7 and the workup was negative.  She was sent home.   She then developed periods that were witnessed by her husband involving  jerking, as well as being able to hear voices, but not respond.   PAST PSYCHIATRIC HISTORY:  Mrs. Gearing states that she has been through  marital counseling.  She denies any other history of psychiatric care.   In review  of the past medical record, depression is mentioned.  She was  not admitted on any psychotropic agents.   In March 2004 she was discharged from a general medical admission on  Xanax 0.5 mg t.i.d.  The past medical record was reviewed.   FAMILY PSYCHIATRIC HISTORY:  None known.   SOCIAL HISTORY:  Please see the above.  Mrs. Paulo has strong religious  faith and support, including church.  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  She continues to reside with  her husband.  She does not use alcohol or illegal drugs.   PAST MEDICAL HISTORY:  Chronic interstitial cystitis, hemorrhoids,  cholecystectomy, tubal ligation, knee arthroscopy on the left side,  hysterectomy, fibromyalgia, hiatal hernia, hypothyroidism,  endometriosis, remote, Prinzmetal's angina.   MEDICATIONS:  The MAR is reviewed.  1. Mrs. Cardy is on her Synthroid 50 mcg daily.  2. She has p.r.n. Valium 5 mg q.i.d.  p.r.n.  3. Ativan 1 mg q.4 p.r.n.   ALLERGIES:  SULFA, MORPHINE SULFATE, NONSTEROIDAL ANTI-INFLAMMATORY  DRUGS, COX-2 INHIBITORS.  She has allergy to NICKEL.  She is allergic to  FENTANYL.   LABORATORY DATA:  Sodium 139, BUN 5, creatinine 0.87, glucose 104, SGOT  21, SGPT 21, magnesium normal.  WBC 8.1, hemoglobin 12.9, platelet count  313.  Free T4 normal.  Head CT without contrast on January 7:  Small  amount of left peripheral cerebellar calcification, otherwise normal.   REVIEW OF SYSTEMS:  CONSTITUTIONAL, HEAD, EYES, EARS, NOSE AND THROAT,  MOUTH, NEUROLOGIC, PSYCHIATRIC CARDIOVASCULAR, RESPIRATORY,  GASTROINTESTINAL, GENITOURINARY, SKIN, MUSCULOSKELETAL, HEMATOLOGIC,  LYMPHATIC, ENDOCRINE, METABOLIC all unremarkable except that Mrs. Mooradian  reports that when she had some of these symptoms that resulted in a  negative organic workup in the past, the workup was cardiologic and that  was performed in the fall.  She is denying recalling any other  neurologic workup before this admission.  There was an EEG listed in the  fall of 2009.  However, there is no other listing of neurological  evaluation in the record.   Mrs. Hourihan is currently stating that she would want to pursue a second  neurological opinion.   PHYSICAL EXAM:  VITAL SIGNS:  Temperature 97.6, pulse 86, respiratory  rate 20, blood pressure 97/67, O2 saturation on 2 liters 98%.  GENERAL APPEARANCE:  Mrs. Merlino is a middle-aged female, lying in a  supine position in her hospital bed with no abnormal involuntary  movements.   MENTAL STATUS EXAM:  Mrs. Lienhard maintains good eye-contact.  Her  attention span is normal.  Her concentration is normal.  Her affect is  mildly flat with little fluctuation.  She does express some tears  occasionally that are appropriate to content.  Her mood is normal.  She  describes constructive future goals and interests, including music and  church.   She is completely oriented to all  spheres.  Her memory is intact to  immediate, recent and remote.  Her fund of knowledge and intelligence  are within normal limits.  Her speech involves soft volume, normal rate,  normal prosody, no dysarthria.  Thought process logical, coherent, goal-  directed.  No looseness of associations.  Thought content:  No thoughts  of harming herself or others, no delusions or hallucinations.  Insight  is intact regarding the stress that she has been under.  Judgment is  intact.   ASSESSMENT:  AXIS I:  1. 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  Please see the      discussion below.  2. Rule out somatoform disorder, not otherwise specified.  AXIS II:  Deferred.  AXIS III:  See past medical history.  AXIS IV:  Marital, primary support group, grief, general medical.  AXIS V:  55.   Mrs. Tonkinson is not at risk to harm herself or others.  She presents no  emergency psychiatric symptoms.  She does agree to call emergency  services as needed.   The undersigned provided ego supportive psychotherapy and education.   A basic discussion of repression and other psychodynamics were discussed  regarding the possibility of a somatoform condition.   Mrs. Monceaux does understand, through her own reading and her experience  as a nurse, how stress can read result in non-organic somatic  manifestations and she is willing to consider that possibility.   However, at this time, she would like to pursue further organic workup  while keeping the recommendation for insight-oriented psychoanalytic  psychotherapy in mind.   RECOMMENDATION:  Regarding the use of benzodiazepines, intermittent  small dosing can help with feeling on edge, muscle tension in the  context of fibromyalgia.  However, if she persists in having the need  for benzodiazepines, an alternative treatment could be to utilize small  dose trazodone, starting in the evening at 25 mg q.h.s. with caution  about excessive sedation or  nausea.   Would continue to recommend psychotherapy for her neurologic symptoms if  her pursuit of additional organic workup is negative and helps to  convince her further regarding the necessity for insight-oriented  psychoanalytic psychotherapy.   For this type of therapy the patient may require some traveling.  There  are a [place]   The intake department at the [facility], [phone]  phone number, can be a source of referral information regarding  psychotherapists in the community.   Generally, therapists with the experience and training to treat  somatoform conditions will have a PhD or are psychiatrists.  Antonietta Breach, M.D.  Electronically Signed     JW/MEDQ  D:  12/25/2008  T:  12/25/2008  Job:  [id number]

## 2011-04-27 NOTE — H&P (Signed)
Crystal [name], Crystal [name]                ACCOUNT NO.:  [id number]   MEDICAL RECORD NO.:  [id number]          PATIENT TYPE:  INP   LOCATION:  1423                         FACILITY:  [facility]   PHYSICIAN:  Eric L. August Saucer, M.D.     DATE OF BIRTH:  Jul 31, 1961   DATE OF ADMISSION:  09/27/2008  DATE OF DISCHARGE:                              HISTORY & PHYSICAL   CHIEF COMPLAINT:  Progressive exertional dyspnea with tachycardia and  progressive weakness.   HISTORY OF PRESENT ILLNESS:  This is the first recent [facility] admission for this 50 year old married black female with  multiple medical problems including chronic fibromyalgia, hypertension,  asthma, morbid obesity and interstitial cystitis who is admitted at this  time for evaluation of progressive weakness with dyspnea with minimal  activity.  The patient has a long standing history of activity induced  asthma in the past.  She also has a history of restrictive lung disease,  hypoventilation syndrome and sleep apnea, which has been followed by Dr.  Maple Hudson most recently.  She states she was doing only moderately well  until August 31, 2008.  At that time, she developed an episode of  abdominal pain with reports of seeing blood in the stool.  She had an  episode of weakness with subsequent questionable seizure activity.  The  patient did go to the emergency room for further evaluation.  It was  felt that she was unconscious for approximately 1 minute.  There was no  associated tongue biting or incontinence noted.  She was evaluated in  the emergency room without confirmation of actual seizures.  She was  noted to have a potassium of 3.2.  This was treated and she was sent  home.  The patient reports that since that time she has had progressive  weakness.  She notes problems with significant tachycardia with  lightheadedness and dizziness with any attempts at ambulation.  The  patient had been seen in the office 2 weeks ago for the  above-noted  symptoms.  She was noted to be orthostatic at that time.  The patient  has been on Maxzide and this was held.  She was also taking Cardizem 180  mg daily.  This was changed to Cardizem 60 b.i.d. to assist with  tachycardia as well as pulmonary status.  The patient notably did not  improve significantly during that time.  She was encouraged to improve  her use of her incentive spirometry during this period as well.  Despite  her measures, she has noted progressive weakness.  She notes that when  she is at rest and sitting, she feels well.  When she ambulates, she has  tachycardia with rates as high as 110.  Her blood pressure has not  demonstrated recent orthostatic drops as well.  Because of progression  of her symptoms without clear etiology, the patient was admitted for  further evaluation.  There is concern for possible atypical pulmonary  embolus versus other.   PAST MEDICAL HISTORY:  Well documented in old records.  1. She notably had an admission in 2004  for severe exercise-induced      bronchospasm.  She notably had similar symptoms to her present      complaints, except for being more manifested as asthma.  2. The patient has longstanding diabetes mellitus type 2, non-insulin      dependent.  3. History of chronic interstitial cystitis.  4. Distant history of Prinzmetal's angina.  5. She has fibromyalgia which has been managed by a Dr. Efraim Kaufmann.      She notably receives lidocaine injections on a weekly basis for      this for several years.  She notably had decreased this once her      present symptoms became worse.  6. The patient also has a history of irritable bowel syndrome with      constipation.  7. Past history of endometriosis managed by Dr. Pennie Rushing in the past as      noted.  8. History of acid reflux, hiatal hernia and hypothyroidism.   The patient notably most recently had been evaluated for her rectal  bleeding by Dr. Charlott Rakes.  She had  in March of this year  undergone colonoscopy which demonstrated internal hemorrhoids.  Just as  of September 25, 2008, she did a capsule endoscopy which demonstrated a  non-bleeding erosive area in the proximal small bowel region.  She was  advised to avoid NSAIDs and was continued on PPI agents for support.   PAST SURGICAL HISTORY:  1. Otherwise notable for cholecystectomy in 1987.  2. Status post tubal ligation in 1990.  3. Left knee arthroscopy with debridement 1991.  4. Hysterectomy in 1992.  5. She has had a bunionectomy of the right great toe in 2001.   She, of note, has pseudocholinesterase deficiency syndrome that has been  documented in the past.  The patient has been followed by several  physicians as noted; Dr. Efraim Kaufmann, her neurologist for her  fibromyalgia.  She has also been seen by Dr. Lavonne Chick in [place],  Dr. Maple Hudson from Pulmonary Medicine and Dr. Bosie Clos for GI complaints.   ALLERGIES:  The patient is allergic to SULFA, NSAIDS, and MORPHINE which  mainly causes projectile nausea and vomiting.   She has documented allergies to dust, ragweed and other environmental  agents.   PRESENT MEDICATIONS:  1. Cardizem 60 mg p.o. t.i.d.  2. Metoprolol 50 mg half-tablet p.o. b.i.d. which did help her      tachycardia without exacerbating any asthma.  3. Glucophage 500 mg p.o. a.c. breakfast and h.s.  4. previously on Maxzide 25 mg half tablet p.o. which has been held.  5. Amor thyroid 30 mg daily.  6. Imdur 30 mg daily.  7. Valtrex 500 mg b.i.d.  8. K-Dur 20 mEq b.i.d. presently.  9. Elmiron 100 mg 2 capsules t.i.d.  10.Previously on Opana 5 mg p.o. b.i.d. p.r.n. breakthrough pain.  11.Tylox 1-2 caps p.o. q.4-6 hours for breakthrough pain (given by her      neurologist).  12.She also been on DuoNeb 3 mL via handheld nebulizer q.4 hours      p.r.n. asthma attack.  13.Lidoderm patches 5%, 1-2 patches q.24 hours p.r.n. back pain.  14.Midrin protocol p.r.n.  migraine headaches.  15.She had been receiving Nubain 10 mg IM once a month, last being in      July.  16.Diazepam 5 mg q.i.d. p.r.n. urinary retention.  17.She has also been on a sliding scale using Humulin R.   SOCIAL HISTORY:  The patient is married and has  2 adult children.  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  The patient acknowledges multiple family  stressors at this time.   PHYSICAL EXAM:  She is a well-developed, morbidly obese black female,  presently in no acute distress in the office setting.  VITAL SIGNS: Reveal blood pressure of 130/70 sitting, dropping to 120/74  standing with mild symptomatology of being lightheaded. There was no  associated tachycardia noted.  In the hospital, she was afebrile with  temperature of 97.6, pulse of 80 at rest, respiratory rate 20, blood  pressure 137/66 and O2 SATs were 96% on room air.  HEENT: Head normocephalic, atraumatic without bruits.  Extraocular  muscles intact.  No scleral icterus.  Fundi grade 1.  There was no sinus  tenderness.  TMs were clear.  Nose: Mild turbinate edema without  occlusions.  Throat: Posterior pharynx clear.  NECK: Supple.  No enlarged thyroid.  No positive cervical nodes.  LUNGS:  Notable for diminished breath sounds with decreased inspiratory  excursion.  No E:A changes appreciated throughout.  No rub or wheezes  appreciated.  The patient notably moves very little air.  CARDIOVASCULAR:  Normal S1 and S2.  No S3, S4.  She has 1/6 systolic  ejection murmur at her lower left sternal border.  ABDOMEN:  Obese.  Bowel sounds present.  No masses or tenderness noted.  MUSCULOSKELETAL EXAM:  She has full range of motion in upper  extremities.  She does have marked tenderness and multiple trigger  points in her trapezoid muscles in upper back, anterior thigh and legs.  She has presently trace edema in the legs (improved).  Negative Homan's.  Pulses  are intact.  SKIN:  Without active lesions.  NEUROLOGICALLY:  She is alert and oriented x3.  Cranial nerves are  intact.  Strength is intact.  DTRs are 1+.  PSYCHIATRIC:  The patient is anxious with some depression noted as well;  frustrated over her present limitations.   LABORATORY DATA:  CBC revealed WBC of 13,400 (elevated hemoglobin 12.9,  hematocrit 40.6).  Platelets 149,000.  She had 63% polys, 20% lymphs,  8.4 absolute neutrophil percentage which is increased.  D-dimer was 0.60  mildly elevated.  Chemistry:  Sodium 140, potassium 4.0, chloride 101,  CO2 of 32, BUN of 8, creatinine of 0.87, glucose of 109, total bilirubin  of 0.4, alkaline phosphatase of 73, SGOT 13, SGPT 14, total protein 6.8,  albumin 3.5, calcium 9.2, CPK of 34, NPB of 0.6, troponin 0.02.  EKG and  chest x-ray pending.   IMPRESSION:  1. Progressive exertional dyspnea with tachycardia by history,      questionable etiology.  Rule out progressive deconditioning.  Rule      out atypical pulmonary embolism, rule out restrictive lung disease,      progressive, rule out pulmonary hypertension.  The patient notably      denies a gradual decline in her conditioning versus marked      worsening since August 30, 2008 time frame.  2. History of exercise-induced asthma in the past.  Rule out atypical      presentation.  3. Status post orthostatic hypotension, improved.  4. Fibromyalgia with multiple medications chronically.  Could not      exclude adverse medication effect.  5. Morbid obesity.  6. Diabetes mellitus.  7. History of restrictive lung disease with hypoventilation syndrome,      rule out progression.  8. Chronic fatigue  syndrome.  9. Irritable bowel syndrome with chronic constipation.  10.History of hiatal hernia with reflux.  11.Recent history of rectal bleeding secondary to internal hemorrhoids      versus other.  12.History of early small-bowel ulcer versus other.  13.Rule out atypical angina  versus other to explain the patient's      present symptoms.  14.History of at anxiety disorder with depression.  Rule out atypical      presentation.   PLAN:  1. Patient admitted to Telemetry for close monitoring of her cardiac      rhythm to further document cause of her symptoms.  2. She has been scheduled for a spiral CT scan to rule out pulmonary      emboli versus other.  3. We will minimize her medications as much as possible to rule out      adverse effects of this at this time.  4. Physical Therapy consult addressing her respiratory status for      energy conservation to improve the patient's tolerance.  5. Follow up with Pulmonary Medicine pending above evaluation.  We      will obtain cardiological input as well.  We will continue      intensive pulmonary toilet at this time as well.  Further therapy      pending response to the      above. It is acknowledged that the patient has multiple chronic      medical conditions with multiple medications which will make      interpreting her present symptoms difficult.  Follow up further      therapy pending response to above.           ______________________________  Lind Guest. August Saucer, M.D.     ELD/MEDQ  D:  09/28/2008  T:  09/28/2008  Job:  [id number]

## 2011-04-27 NOTE — Discharge Summary (Signed)
Crystal [name], Crystal [name]                ACCOUNT NO.:  [id number]   MEDICAL RECORD NO.:  [id number]          PATIENT TYPE:  INP   LOCATION:  1423                         FACILITY:  [facility]   PHYSICIAN:  Eric L. August Saucer, M.D.     DATE OF BIRTH:  23-Aug-1961   DATE OF ADMISSION:  09/27/2008  DATE OF DISCHARGE:  10/05/2008                               DISCHARGE SUMMARY   FINAL DIAGNOSES:  1. Chronic hypoventilation syndrome.  2. Morbid obesity 278.01.  3. Myalgia and myositis 729.1.  4. Diabetes mellitus type 2 non-insulin dependent 250.00.  5. 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  6. Chest pain noncardiac 786.59.  7. Chronic fatigue syndrome 780.71.  8. Acute stress reaction 308.9.  9. Benign paroxysmal vertigo 386.11.  10.Tachycardia 785.0.  11.Abnormal lung CT scan rule out atypical pneumonia 518.89.   OPERATIONS AND PROCEDURES:  Stress Myoview, per Dr. Sharyn Lull.   HISTORY OF PRESENT ILLNESS:  This is the first recent [facility] admission for this 50 year old married black female with  multiple medical problems including chronic fibromyalgia, hypertension,  asthma, morbid obesity, and interstitial cystitis who was admitted for  evaluation of progressive weakness and dyspnea on minimal activity.  The  patient had a longstanding history of activity-induced asthma in the  past.  She also has a history of restrictive lung disease,  hypoventilation syndrome, and sleep apnea which had been followed by Dr.  Maple Hudson most recently.  The patient stated that she was doing only  moderately well until August 31, 2008.  At that time, she developed  an episode of abdominal pain with reports of seeing blood in the stool.  She had an episode of weakness with subsequent questionable seizure  activity.  The patient did go to emergency room for further evaluation.  It is felt that she was unconscious for approximately one minute.  There  was no associated tongue-biting or incontinence noted.  She was  evaluated in the emergency room without confirmation of actual seizures.  She was noted have a low potassium at that time and was treated and sent  home.  She reports that since that time, she has had progressive  weakness.  She notes problems with significant tachycardia with  lightheadedness and dizziness with any attempts at ambulation.  The  patient had been seen in the office two weeks prior to admission for the  above-noted symptoms.  She was noted to be orthostatic at that time.  Her diuretic was held at that time.  Her Cardizem was changed from 180  mg daily to 60 mg b.i.d. as well.  She did not improve significantly  thereafter.  She was also encouraged use incentive spirometry, as she  was noted to have low lung volumes.  She stated she did this without  significant improvement.  Presently at the time of admission, she notes  that when she ambulates, she has tachycardia with rates as high as 110.  Because of progression of her symptoms without clear etiology, the  patient was admitted for further evaluation.  There was concern for  possible atypical pulmonary embolus versus other  as well.   Past medical history and physical exam is per admission H and P.   HOSPITAL COURSE:  The patient was admitted for further evaluation of  progressive exertional dyspnea with tachycardia by history of  questionable etiology.  There was concern for this being secondary to  severe deconditioning versus atypical pulmonary embolus versus her  restrictive lung disease versus pulmonary hypertension.  She was also  noted have exercise-induced asthma as well.  The patient was, therefore,  admitted to telemetry initially for close monitoring of her cardiac  status to further clarify her symptoms and actual findings.  Notably, at  time of admission, she had a D-dimer which was at 0.6 which was mildly  elevated.  Her electrolyte picture was otherwise unremarkable.  CK of 34  and an MB of 0.6.  Hemoglobin  was 12.9 with hematocrit of 40.6.  A white  count was slightly elevated at 13,400.  A clean-catch urine was  obtained.  Thyroid functions were reevaluated as well.  In lieu of her  abnormal pulmonary symptoms, a spiral CT scan of the lungs was obtained.  This actually was positive for small pockets of inflammation or  infection involving the left lung.  There was no definite evidence for  pulmonary embolus.  The patient was started on IV antibiotics  empirically.  She was placed on nebulizer solution as well.  She was  encouraged to use incentive spirometry.  As this did not explain the  patient's exertional dyspnea, a cardiology consultation was obtained  with Dr. Sharyn Lull.  It is felt that her exertional symptoms could be  suggestive of underlying angina.  This was also in lieu of her EKG being  abnormal in nature with a question of an old anterior infarct which had  not been documented, presumably silent.  The patient thereafter was  scheduled for a Persantine Myoview study, per Dr. Sharyn Lull.  In the  interim, her cardiac medicines were adjusted as well.  Notably with  this, she was not demonstrated to have significant tachycardia, i.e.  greater than 100, on monitor despite her feelings of palpitations.  She  was continued on an intensive pulmonary therapy with nebulizers,  incentive spirometry, and antibiotics.  She was seen by physical therapy  as well.  She was noted to have marked deconditioning with minimal  activity.   The patient did subsequently undergo a Persantine Myoview study, per Dr.  Sharyn Lull, on October 01, 2008.  She was found to have a weakly positive  Persantine stress test.  She, during that time, was noted have a drop in  the blood pressure with activity.  This did respond to fluid challenge  well.  Subsequent Myoview study was found to be negative for ischemia.  Her calcium channel agent was subsequently adjusted thereafter.   During the hospital stay, the issue of  her chronic pain syndrome was  addressed as well.  She had been on a number of narcotic medications as  well as receiving parenteral lidocaine for control of her fibromyalgia  symptoms.  These medicines were subsequently stopped and she was placed  on methadone at a dose of 10 mg p.o. q.i.d..  Notably on this regimen,  her pain was controlled to the point that she actually rated the pain as  a 2 out of 10.   With continued PT attempts at ambulation, the patient complained of  problems with nausea and true vertigo with episodes.  It was felt that  she was having  paroxysmal vertigo associated with ambulation.  An MRI  scan of the head was obtained rule out acoustic neuroma versus other.  This was held initially as she had significant claustrophobia.  It was  subsequently performed and she tolerated it well.  This was subsequently  found be negative for acoustic neuroma or other cancer.   With the above measures being resolved, the issue of depression was  addressed as well.  It was acknowledged that the patient had significant  ongoing stressors as well as atypical depression.  It was difficult to  exclude this as being significant functional overlay to her symptoms.  These were addressed while she was hospitalized as well with plans for  further discussion as an outpatient.   By October 05, 2008, the patient was feeling considerably better.  She  was ambulatory with a rolling walker which she tolerated well.  Her pain  was manageable with her present regimen as well.  It was felt that  further evaluation of the patient's symptoms could be pursued as an  outpatient.   DISCHARGE MEDICATIONS:  Medications at the time of discharge consisted  of:  1. Glucophage 500 mg b.i.d.  2. Armour Thyroid 30 mg daily.  3. Valtrex 500 mg daily.  4. K-Dur 10 mEq 2 tablets b.i.d.  5. Elmiron 100 mg 2 tablets b.i.d.  6. Co-Q10 100 mg daily.  7. Colon Cleanse one capsule 4 times a day p.r.n.  8.  Methadone 10 mg p.o. q.i.d.  9. Ceftin 250 mg b.i.d. for 5 additional days.  10.Flexeril 10 mg t.i.d. p.r.n.  11.Flovent HFA 220 mcg 2 puffs b.i.d.  12.MiraLax 17 grams daily for constipation.  13.Xopenex HFA 2 puffs q.i.d. p.r.n.  14.She may continue using her Epipen as needed.  15.Midrin protocol as needed for headaches.  16.Meclizine 25 mg t.i.d. p.r.n. vertigo.  17.Cardizem CD 240 mg daily.  18.Metoprolol 25 mg 1/2 tablet b.i.d.   DIET:  She will continue to be maintained on a no-concentrated, low-fat  diet.   FOLLOW UP:  The patient will return to this office in two weeks' time  for follow-up.  We will arrange for further follow-up with pulmonary  medicine as an outpatient.  She will most likely need a repeat CT scan  in three months' time to follow up on her borderline abnormal study.           ______________________________  Lind Guest August Saucer, M.D.     ELD/MEDQ  D:  11/10/2008  T:  11/10/2008  Job:  [id number]

## 2011-04-27 NOTE — Consult Note (Signed)
[name], [name] NO.:  [id number]   MEDICAL RECORD NO.:  [id number]          PATIENT TYPE:  INP   LOCATION:  1521                         FACILITY:  [facility]   PHYSICIAN:  Noel Christmas, MD    DATE OF BIRTH:  June 09, 1961   DATE OF CONSULTATION:  DATE OF DISCHARGE:                                 CONSULTATION   REASON FOR CONSULTATION:  Rule out seizure disorder.   HISTORY:  This is a 50 year old African American lady who has been  experiencing recurrent episodes of spontaneously feeling detached from  surroundings or feeling distant from those around her.  She has also had  at times difficulty understanding what is being said to her and is  unable to respond verbally, even though she knows what she wants to say.  There is a feeling of dizziness and confusion and overall feeling of  uneasiness during the spells.  More recently, spells have progressed to  her developing a blank stare according to witnesses followed by onset of  stiffness then jerking of her extremities rhythmically.  This was  followed by a brief period of unresponsiveness for 1-2 minutes then  confusion on waking up.  The patient typically is quite fatigued  afterwards and sleeps quite a bit.  Onset of her symptoms was in  September 2009.  She had an EEG in September, which was normal.  She  reportedly had CT of her brain as well as an MRI, which also reportedly  were normal.  Episodes of losing consciousness and jerking of her  extremities is of very recent onset within the past 1-2 weeks.  CT scan  of her head on this admission showed no acute intracranial abnormality.  Small amount of left peripheral cerebellar calcification was noted.  WBC  count, hemoglobin and hematocrit were unremarkable.  Platelet count was  also normal.  Serum electrolytes including calcium, magnesium, and  phosphorus were also unremarkable.  Blood glucose on admission was 88.   PAST MEDICAL HISTORY:  Remarkable for  chronic interstitial cystitis,  Prinzmetal angina, fibromyalgia, history of irritable bowel syndrome,  endometriosis, hiatal hernia, hypothyroidism, obstructive sleep apnea,  and non-insulin-dependent diabetes mellitus.  The patient also has  morbid obesity in addition to chronic depression and anxiety.   CURRENT MEDICATIONS:  1. Glucophage 500 mg b.i.d.  2. Thyroid 30 mg per day.  3. Methadone 10 mg q.i.d.  4. Flexeril 10 mg per day.  5. Metoprolol 25 mg 1/2 tablet per day.  6. Valtrex 500 mg 2 tablets daily.  7. Diltiazem 240 mg per day.  8. Klor-Con 10 mEq 2 tablets per day,  9. Elmiron 100 mg 2 tablets b.i.d.  10.Colon Cleanse 1 capsule 4 times a day.  11.Coenzyme Q10 100 mg per day.  12.Adenosine with vitamin B12 injections weekly.   FAMILY HISTORY:  Negative for epilepsy.  Family history is positive for  myocardial infarction involving her mother as well as hypertension and  diabetes mellitus involving her father.  Her mother also has peripheral  vascular disease.   PHYSICAL EXAMINATION:  GENERAL:  She was markedly  obese, middle-aged  appearing lady who was alert and cooperative and in no apparent  distress.  She was well oriented to time as well as place.  Short-term  and long-term memory normal.  Affect was somewhat depressed.  HEENT:  Pupils were equal, reactive normally to light.  Extraocular  movements were full and conjugate.  Visual fields were intact and  normal.  There was no facial weakness.  Hearing was normal.  Speech and  palatal movements were normal.  NECK:  Carotid auscultation was normal.  NEUROLOGIC:  Strength and muscle tone were normal throughout.  Deep  tendon reflexes were normal and symmetrical.  Plantar responses were  flexor.  Sensory examination was normal including vibratory sensation  distally in both fee.   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    RECOMMENDATIONS:  1. Anticonvulsant treatment with Keppra 1000 mg as a loading dose      intravenously followed by 500 mg q.12 h. p.o. as maintenance      treatment.  2. EEG in the a.m.  3. MRI of the brain in the a.m. without and with contrast to rule out      possible mass lesion as etiology for seizure activity.   Thank you for asking me to evaluate Crystal [name].      Noel Christmas, MD  Electronically Signed     CS/MEDQ  D:  12/22/2008  T:  12/23/2008  Job:  [id number]

## 2011-04-27 NOTE — Assessment & Plan Note (Signed)
[facility]                             PULMONARY OFFICE NOTE   SUMIRE, HALBLEIB                       MRN:          [id number]  DATE:04/27/2007                            DOB:          23-Jan-1961    PROBLEM:  1. Asthma.  2. Vocal cord dysfunction.  3. Esophageal reflux.  4. Morbid obesity.  5. Anxiety.  6. Atypical chest pain.   HISTORY:  She feels stable. She has resumed allergy vaccine, holding at  0.1 mL of 1:50 because she always gets a small local reaction which may  actually be irritant effect of the diluent.  She has been quite stable  without significant respiratory flares and vaccine seems to be part of  the program that helps. her. She has not had significant pollen problems  this spring.   MEDICATIONS:  Her list is charted and reviewed. Significant particularly  for allergy vaccine and Rescue albuterol inhaler. She does still have  p.r.n. oxygen at 2 liters.   OBJECTIVE:  Weight 322 pounds, BP 112/66, pulse 109, room air saturation  97%.  She is obese, in no distress. Breath sounds are shallow but clear.  No significant nasal congestion.  Voice quality is normal. Pulse regular  without murmur.   IMPRESSION:  Asthma and I continue to believe, some overlay.  There  has been an atopic component based on skin test response and her  clinical improvement with allergy vaccine.  She has an Epi-Pen which she  has not needed and has been given instruction on environmental  precautions.   PLAN:  Continue allergy vaccine at current dose. Schedule return in 6  months, earlier p.r.n.     Clinton D. Maple Hudson, MD, Tonny Bollman, FACP  Electronically Signed    CDY/MedQ  DD: 04/29/2007  DT: 04/29/2007  Job #: [id number]   cc:   Minerva Areola L. August Saucer, M.D.

## 2011-04-27 NOTE — Op Note (Signed)
Crystal [name], Crystal [name]                ACCOUNT NO.:  [id number]   MEDICAL RECORD NO.:  [id number]          PATIENT TYPE:  AMB   LOCATION:  ENDO                         [facility]ACILITY[facility]   PHYSICIAN:  Shirley Friar, MDDATE OF BIRTH:  1961/03/09   DATE OF PROCEDURE:  DATE OF DISCHARGE:  09/18/2008                               OPERATIVE REPORT   PROCEDURE:  Capsule endoscopy.   REASON FOR PROCEDURE:  Heme-positive stool.   HISTORY:  A 50 year old black female seen initially by me in March  secondary to rectal bleeding, who underwent a colonoscopy which showed  internal hemorrhoids but was otherwise unremarkable.  I saw her again in  September on referral from Dr. Diamantina Providence office because the patient was  having reported black, tarry stools.  She was heme-positive with brown  stool in Dr. Diamantina Providence office.  An upper endoscopy was done which was  unremarkable for source of this heme-positive stool that she had.  It  was decided to do a capsule endoscopy to further evaluate her heme-  positive stools.   PROCEDURE AND FINDINGS:  Gastric passage time is 4 minutes, small bowel  passage time 7 hours 44 minutes.  First gastric image noted at 44  seconds and first duodenal image noted at 4 minutes 59 seconds.   A nonbleeding erosive area was noted in the proximal small bowel.  There  was a focal area of nodularity with ulceration in the proximal small  bowel as well as distal to this erosion.  The erosion was noted at 6  minutes 32 seconds and the nodular area was noted at 9 minutes 49  seconds.  No active bleeding was seen.   PLAN:  1. Consider enteroscopy versus small-bowel series to assess the distal      duodenum (i.e., to assess the nodular ulcerated area.).  2. Avoid NSAIDs.      Shirley Friar, MD  Electronically Signed     VCS/MEDQ  D:  09/25/2008  T:  09/25/2008  Job:  [id number]   cc:   Minerva Areola L. August Saucer, M.D.  Fax: [phone]

## 2011-04-27 NOTE — Op Note (Signed)
NAMESHINE, SCROGHAM                ACCOUNT NO.:  [id number]   MEDICAL RECORD NO.:  [id number]          PATIENT TYPE:  AMB   LOCATION:  ENDO                         [facility]ACILITY:  [facility]   PHYSICIAN:  Shirley Friar, MDDATE OF BIRTH:  17-Apr-1961   DATE OF PROCEDURE:  DATE OF DISCHARGE:                               OPERATIVE REPORT   PROCEDURE:  Colonoscopy.   INDICATIONS FOR PROCEDURE:  Rectal bleeding.   MEDICATIONS:  Propofol 450 mg IV infusion per anesthesia, fentanyl 100  mcg IV, Versed 6 mg IV.   FINDINGS:  Rectal exam was normal.  A pediatric Pentax colonoscope was  inserted into a fair prepped colon and advanced to the cecum where the  ileocecal valve and appendiceal orifice were identified.  In order to  reach the cecum, abdominal pressure was necessary as well as repeated  loop reduction due a very tortuous colon, restricted mobility of colon,  and looping that occurred during the procedure.  On careful withdrawal  of the colonoscope, no mucosal abnormalities were seen.  Retroflexion  revealed small internal hemorrhoids.   ASSESSMENT:  1. Internal hemorrhoids, otherwise normal colonoscopy.  2. Fair prep may have prevented visualization of small lesions.   PLAN:  1. Repeat colonoscopy in 5 years.  2. High-fiber diet.      Shirley Friar, MD  Electronically Signed     VCS/MEDQ  D:  03/21/2008  T:  03/21/2008  Job:  [id number]   cc:   Minerva Areola L. August Saucer, M.D.  Fax: [phone]

## 2011-04-30 NOTE — Op Note (Signed)
[place]. [facility]  Patient:    Crystal [name], Crystal [name]                       MRN: [id number] Proc. Date: 01/26/00 Adm. Date:  [id number] Attending:  Marcene Corning                           Operative Report  PREOPERATIVE DIAGNOSIS:  Right foot bunion.  POSTOPERATIVE DIAGNOSIS:  Right foot bunion.  OPERATION PERFORMED:  Excision bunion, right foot.  ANESTHESIA:  General.  ATTENDING SURGEON:  Lubertha Basque. Jerl Santos, M.D.  ASSISTANT:  Lindwood Qua, P.A.  INDICATIONS FOR PROCEDURE:  The patient is a 50 year old woman with a very long  history of right foot pain.  This has persisted despite braces, pads, anti-inflammatories and many other interventions.  She has undergone an MRI of he joint which was fairly benign.  Having failed all these measures and for two years having not been in a regular shoe, she was offered a Silver bunionectomy.  Some of her pain was exquisite and was just distal to the joint.  It was felt that this  might represent some irritation of the nerve or potentially a neuroma and one part of the procedure planned was to extend the incision slightly to explore the area. The procedures were all discussed with the patient and informed operative consent was obtained after discussion of possible complications of reaction to anesthesia, infection, joint stiffness, and the probability of some numbness along one border of her toe.  DESCRIPTION OF PROCEDURE:  The patient was taken to an operating suite where general anesthetic was applied without difficulty.  She was then positioned supine and prepped and draped in normal sterile fashion.  After the administration of preop IV antibiotics, the right leg was elevated, exsanguinated, and a tourniquet inflated about the calf.  A dorsomedial incision was made longitudinally over the first MTP joint.  Dissection was taken distally to the area of her exquisite pain. Two small nerve branches  were removed sharply with a 15 blade.  This seemed to consist of some branches of the dorsal nerve in that area.  The capsule was exposed and was opened in V-Y fashion.  This exposed the first metatarsal head.  A saw as then used to remove the proud portion of this bone parallel with the medial border of the foot.  The rasp was used to smooth the edges of the resected area. Fluoroscopy was used to confirm adequate resection and I read these views myself. The wound was then irrigated followed by reapproximation of the capsule with 0 Vicryl suture in interrupted fashion.  The skin was then reapproximated with nylon after the tourniquet was deflated.  Adaptic was placed on the wound followed by dry gauze and a loose Ace wrap.  Estimated blood loss and intraoperative fluids can be obtained from Anesthesia records as can accurate tourniquet time.  DISPOSITION:  The patient was extubated in the operating room in stable condition and was taken to the recovery room in stable condition.  Plans were for her to go home the same day and to follow up in the office in less than a week.  I will contact her by phone tonight. DD:  01/26/00 TD:  01/26/00 Job: [id number]

## 2011-04-30 NOTE — H&P (Signed)
NAME:  RHEN, DOSSANTOS                          ACCOUNT NO.:  [id number]   MEDICAL RECORD NO.:  [id number]                   PATIENT TYPE:  INP   LOCATION:  3728                                 FACILITY:  [facility]   PHYSICIAN:  Eric L. August Saucer, M.D.                  DATE OF BIRTH:  24-Dec-1960   DATE OF ADMISSION:  10/19/2002  DATE OF DISCHARGE:                                HISTORY & PHYSICAL   CHIEF COMPLAINT:  Refractory cough, shortness of breath, progressive  weakness.   HISTORY OF PRESENT ILLNESS:  This is the first recent [facility]  admission for this 50 year old married black female with multiple medical  problems who presented to the office complaining of increasing refractory  cough up to 2 weeks' duration.  The cough was associated with shortness of  breath as well.  She had noted progressive weakness during this time.  She  had previously been at her stable medical problems prior to this time.  She  had not noted any increasing sign of congestion or drainage.  Noted the  onset of cough.  She did have increased sputum production.  She was unable  to rest supine over the subsequent days.  The patient used her home  nebulizer treatments without significant improvement.  With progression of  symptoms, she presented to the office for further evaluation.   PAST MEDICAL HISTORY:  History is significant for multiple medical problems.  She has longstanding history of fibromyalgia which had been followed by Dr.  Thomasena Edis of neurology.  She had been on a complex regimen for treatment of  this.  She also has chronic fatigue syndrome as well as degenerative disk  disease of the L5-S1 region.  The patient has suffered from morbid obesity  over the past several years with relative inactivity.  She has had recurring  knee problems for which she had undergone arthroplasty.  Most recently, the  patient was diagnosed with diabetes mellitus.  She had been on oral agents  with  intermittent insulin therapy for management of this.  She has also been  diagnosed with interstitial cystitis since 1997.  She has been followed  closely by the urologists for this problem as well.   History otherwise significant for mild sleep apnea.  She has not required  CPAP.  She however, has not had a recent repeat sleep study.  Irritable  bowel syndrome.  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   The patient has also had longstanding asthma as noted.   PREVIOUS SURGERIES:  She is status post cholecystectomy.  Status post  hysterectomy.  She has had arthroscopy of the knees.  She did undergo a  cardiac catheterization 1999 which was negative for significant heart  disease.   HABITS:  The patient does not smoke or drink.  She has been disabled due to  her medical problems.   MEDICATIONS:  Medications have been complex with efforts to decrease not  being successful.  They consist of Allegra 180 mg one q.d., Amaryl 4 mg one  and one half tablets q.d.  Valtrex 500 mg p.o. b.i.d., Flexeril 10 mg p.o.  q.i.d., Zaroxolyn 2.5 mg q.d. p.r.n. edema, Maxzide 75/50 mg one p.o. q.d.,  mexiletine 150 mg q.i.d. per her neurologist, Vivactil 10 mg t.i.d. p.r.n.  per her neurologist, isosorbide 30 mg p.o. q.d., Elmiron 100 mg one q.d., K-  Dur 20 mEq p.o. b.i.d., Vicodin ES 0.5 mg q.4h. p.r.n. pain, Midrin protocol  for migraine headaches.  Advair Diskus 250/50 one puff b.i.d.  Of note, she  was taking this only once per day.  Lidoderm patch 5% p.r.n. pain.  __________ three tablets q.h.s. p.r.n. constipation.  DuoNeb nebulizer 3 cc  t.i.d. via hand-held nebulizer.  The patient has continued to receive weekly  injections of lidocaine and Benadryl per neurologist for treatment of her  fibromyalgia.  She has also had Nubain injections 10 mg IM every week for  p.r.n. migraine headaches.  She remains on Singulair 10 mg p.o. q.d.   ALLERGIES:  SULFUR, IBUPROFEN,  MORPHINE.   PHYSICAL EXAMINATION:  GENERAL: She is an obese black female in mild  distress.  VITAL SIGNS: Height 5 feet 8 inches, weight 329 pound.  Blood pressure  142/80, pulse of 98, respiratory rate 20, temperature 97.5.  HEENT: Head normocephalic, atraumatic without bruits.  Extraocular muscles  are intact.  No sinus tenderness.  Mild turbinate edema without occlusions.  TMs with decreased light reflex on the left with mild erythematous changes,  right TM is clear.  NECK: Minimal posterior cervical nodes on the left versus the right.  Posterior pharynx is clear.  Thyroid without tenderness or masses.  LUNGS: Distant breath sounds.  This is punctuated by frequent severe cough.  She has scattered E/A changes at the bases.  No rales appreciated.  CARDIOVASCULAR: She has a rapid rhythm, normal S1 and S2.  No S3.  No S4.  No murmurs appreciated.  ABDOMEN: Obese, bowel sounds present.  No significant dullness in lower  quadrants.  MUSCULOSKELETAL:  Extremities notable for diffuse tenderness with scattered  trigger points in the scapula region, her arms, and anterior thighs.  Trace  edema bilaterally.  Negative Homan's.  NEUROLOGICAL: She is alert, oriented x3.  Cranial nerves are intact.  No  pathologic reflexes noted.  SKIN: Without active lesions.   LABORATORY DATA:  WBC 12,200, hemoglobin 13.4, hematocrit of 39.9, platelets  of 338,000.  MCV of 80.2.  Chemistries: Sodium 136, potassium low at 2.5,  chloride 96, CO2 31, BUN 10, creatinine 1.0, glucose reported low at 44.  GI  normal SGOT, PT, alkaline phosphatase and bili.  CK is 163, CK-MB 1.3.  Other lab studies pending.  EKG normal sinus rhythm.  Normal axis.  She has  ST segment depression, T-wave inversions in II and III and lead V3 as well,  has some flattening of the T-waves noted as well.   Chest x-ray pending.  Urinalysis pending as well.   IMPRESSION: 1. Refractory cough with shortness of breath.  Rule out acute  bronchitis     versus atypical asthma versus other.  2. Hypokalemia symptomatic.  This most likely is secondary to excessive     diuresis.  3. Fibromyalgia with refractory pain.  She has been on a complex regimen for     several years.  4. Morbid  obesity.  5. Sleep apnea mild to moderate.  6. Diabetes mellitus recent onset.  Blood sugars however, have been on     average running less than 150.  7. Interstitial cystitis.  8. Degenerative joint disease of the lumbosacral spine.  9. History of Prinzmetal's angina per history.  Have not seen this     documented.  10.      History of endometriosis with subsequent hysterectomy.   PLAN:  The patient admitted to telemetry for further evaluation.  She will  be placed on a dose of Solu-Medrol for her asthma exacerbation.  She will be  placed on Tequin 400 mg p.o. q.d.  We will replace her potassium with runs  of Kay Ciel per IV if tolerated, if not high-dose oral potassium.  This will  need followup on in the morning.  We will also exclude for recent myocardial  injury.  Further therapy thereafter.  Medication change in progress.  Further therapy thereafter.                                              Eric L. August Saucer, M.D.   ELD/MEDQ  D:  10/19/2002  T:  10/21/2002  Job:  [id number]

## 2011-04-30 NOTE — Op Note (Signed)
   NAME:  Crystal [name], Crystal [name]                          ACCOUNT NO.:  [id number]   MEDICAL RECORD NO.:  [id number]                   PATIENT TYPE:  AMB   LOCATION:  DAY                                  FACILITY[facility]  [facility]   PHYSICIAN:  Jamison Neighbor, M.D.               DATE OF BIRTH:  [birth year]   DATE OF PROCEDURE:  06/07/2003  DATE OF DISCHARGE:                                 OPERATIVE REPORT   PREOPERATIVE DIAGNOSIS:  Interstitial cystitis.   POSTOPERATIVE DIAGNOSIS:  Interstitial cystitis.   PROCEDURE:  1. Cystoscopy.  2. Urethral calibration.  3. Hydrodistention of bladder.  4. Marcaine and Pyridium instillation.  5. Marcaine and Kenalog injection.   SURGEON:  Jamison Neighbor, M.D.   ANESTHESIA:  General.   COMPLICATIONS:  None.   DRAINS:  None.   BRIEF HISTORY:  This 50 year old female has known interstitial cystitis.  She has had a flare-up of her symptoms and has requested a hydrodistention  be performed.  The patient understands the fact that there is no guarantee  she will have the same improvement in her symptoms she has had in the past.  She gave full and informed consent.   DESCRIPTION OF PROCEDURE:  After the successful induction of general  anesthesia, the patient was placed in the dorsal lithotomy position, prepped  with Betadine, and draped in the usual sterile fashion.  Cystoscopy was  performed.  Bimanual examination showed no evidence of cystocele, rectocele,  or enterocele.  No masses on bimanual exam.  The urethra was calibrated to  36 [place] with female urethral sounds.  Cystoscope was inserted; the bladder  was carefully inspected.  It was free of any tumor or stones.  Both ureteral  orifices were normal in configuration and location.  The bladder was  distended at a pressure of 100 cm of water for five minutes.  When the  bladder was drained, glomerulations could be seen throughout the bladder,  consistent with interstitial cystitis.  The bladder was  drained.  A mixture  of Marcaine and Pyridium was left within the bladder.  Marcaine and Kenalog  was injected.  Marcaine and Pyridium was instilled.  The patient tolerated  the procedure well and was taken to the recovery room in good condition.                                               Jamison Neighbor, M.D.    RJE/MEDQ  D:  06/07/2003  T:  06/07/2003  Job:  [id number]

## 2011-04-30 NOTE — Discharge Summary (Signed)
Crystal [name], Crystal [name]                ACCOUNT NO.:  [id number]   MEDICAL RECORD NO.:  [id number]          PATIENT TYPE:  INP   LOCATION:  1521                         FACILITY:  [facility]   PHYSICIAN:  Eric L. August Saucer, M.D.     DATE OF BIRTH:  [birth year]   DATE OF ADMISSION:  12/21/2008  DATE OF DISCHARGE:  12/26/2008                               DISCHARGE SUMMARY   FINAL DIAGNOSES:  1. 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  2. Pseudoseizures, rule out.  3. Fibromyalgia, severe and chronic.  4. Morbid obesity.  5. Situational stress, severe.  6. Hypoventilation syndrome.  7. Diabetes mellitus.  8. Chronic interstitial cystitis.  9. Chronic deconditioning.  10.Chronic colonic dysfunction.  11.Polypharmacy.   OPERATION/PROCEDURE:  None.   HISTORY OF PRESENT ILLNESS:  This was the second recent [facility] admission for this 50 year old married black female with a  history of chronic hypoventilation syndrome, fibromyalgia, morbid  obesity, recurrent asthma, and chronic fatigue syndrome.  The patient  had been doing only moderately well until January 7th.  After having  been in the hospital in November for evaluation of progressive weakness  and with associated shortness of breath but no clear etiology being  found, the patient had recently started pulmonary rehab.  On January 7th  while undergoing an exercise routine at the pulmonary rehab lab, the  patient was noted to become weak and suddenly slumped over after sitting  down.  She thereafter had a jerking spell lasting for 45 seconds.  She  was quite unresponsive thereafter.  The patient was taken to the  emergency room for further evaluation.  No clear etiology was found at  that time.  It was felt the patient may have had a pseudoseizure.  She  was subsequently discharged home with followup.  Since that time, the  patient had recurrent episodes of becoming slightly confused followed by  a tightness in the chest.   Husband noted that the patient begins jerking  mainly in her chest region but often times her arms may be flaccid.  The  patient has not fallen since her episode in pulmonary lab.  Because of  the progressive nature of her symptoms and unclear etiology, the patient  was admitted after presenting to the emergency room with similar  episodes.   PAST MEDICAL HISTORY:  Complex and reviewed in admission H and P.   PHYSICAL EXAMINATION:  Complex and reviewed in admission H and P.   HOSPITAL COURSE:  The patient was admitted for further evaluation of  progressive jerking spells with altered sensorium.  Question of partial  seizures versus other.  The patient was admitted to telemetry for  ongoing monitoring.  She was seen acutely by Dr. Noel Christmas of  neurology as well.  Upon his evaluation, it was felt that the patient  may have been experiencing partial complex seizures.  In view of her  being active with such at that time, she was subsequently started on  Keppra 1000 mg, which was done as a loading dose followed by  maintenance.   While in  the hospital, the patient did have several episodes of seizure-  like activity, which was treated with Ativan IV.  She had followup with  Dr. Pearlean Brownie of neurology as well.  Further evaluation including EEG and  MRI scan were both negative for seizure activity or cerebral pathology.  It was felt by Dr. Pearlean Brownie and associates that the patient was  experiencing pseudoseizures.  These results were discussed with the  patient and husband.   Notable, during this time, the patient was experiencing significant  marital stressors, which were addressed as well.  It was unclear if this  was precipitating her pseudoseizures or not at that time.   With the MRI scan being negative, neurology signed off.  Upon review, it  was concerning that there were possible medications which were  exacerbating her symptoms.  The patient also wanted a second opinion  regarding  her seizure-like activity.  Arrangements were thereafter made  rather acutely for the patient to be evaluated by Dr. Domenic Schwab at [facility] who specializes in seizure disorder and specifically  pseudoseizures.  She was subsequently transferred to [facility] on  December 26, 2008, for further evaluation.   At the time of transfer, the patient was continued on her regular  medicines consisting of:  1. Methadone 10 mg q.i.d.  2. Flexeril 10 mg q.i.d.  3. Metoprolol ER 25 mg half tablet b.i.d.  4. Valtrex 500 mg b.i.d.  5. Metformin 500 mg b.i.d.  6. Diltiazem CD 240 mg daily.  7. Klor-Con 10 mEq 2 tablets daily.  8. Elmiron 100 mg 2 tablets b.i.d.  9. Synthroid 50 mcg daily.  10.She will also be maintained on Keppra at 1000 mg daily.   The patient will have follow up thereafter.           ______________________________  Lind Guest. August Saucer, M.D.     ELD/MEDQ  D:  01/22/2009  T:  01/23/2009  Job:  [id number]

## 2011-04-30 NOTE — Discharge Summary (Signed)
NAME:  Crystal [name], Crystal [name]                          ACCOUNT NO.:  [id number]   MEDICAL RECORD NO.:  [id number]                   PATIENT TYPE:  INP   LOCATION:  0359                                 FACILITY:  [facility]   PHYSICIAN:  Eric L. August Saucer, M.D.                  DATE OF BIRTH:  Jan 10, 1961   DATE OF ADMISSION:  11/19/2002  DATE OF DISCHARGE:  11/30/2002                                 DISCHARGE SUMMARY   FINAL DIAGNOSES:  1. Exercise induced asthma with atypical cough.  2. Obesity hypoventilation syndrome.  3. Deconditioning.  4. Hypokalemia, symptomatic, resolved.  5. Diabetes mellitus.  6. Fibromyalgia.  7. Interstitial cystitis.  8. Hiatal hernia with reflux.  9. History of Prinzmetal's angina.  10.      Irritable bowel syndrome with chronic constipation.  11.      Degenerative disk disease of lumbosacral spine.  12.      History of sleep apnea, apparently improved.  13.      Depression.   OPERATIONS/PROCEDURES:  Sleep study.   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  The patient has stabilized in the hospital  after being treated for atypical asthma with paroxysmal cough.  States,  however, after going home she had noted persistence of her symptoms after  several days of being home.  This would occur with bouts of coughing and  wheezing with only minimal activity.  With mild exertion or laughing or  other sudden movements she would get short of breath with coughing.  She  denies fever, chills, or night sweats.  She had been using a nebulizer as  well as multiple medications.  Despite this, however, she noted no  significant improvement.  Over the past several days prior to admission she  had noted progressive weakness with associated lightheadedness upon  standing.  She had also noted episodic room spinning with her becoming  nauseated and flushed as well.  There has been no associated palpitations.  She has been taking the medications directed.  Because of progression of her  symptoms, the patient was subsequently admitted for further evaluation.   Past medical history and physical examination is well documented per  admission H&P.   HOSPITAL COURSE:  The patient was admitted for evaluation of her multiple  medical problems with goals toward stabilizing her regimen.  She was noted  to have significant exercise induced asthma with severe restrictive lung  disease as well.  At the time of presentation she had a positive of 3.0 with  a glucose of 114.  She was seen in consultation by Clinton D. Maple Hudson, M.D. of  pulmonary medicine.  It was recommended in view of her somewhat refractive  nature of her cough equivalent asthma she would be placed on a steroid  regimen.  In view of her diabetes it was felt her sugars would increase, and  a sliding-scale insulin would be necessary.  It was also felt to continue to  cover her for reflux as she was noted to have a small hiatal hernia.  She  had been on pro time pump inhibitor agents throughout this time without  significant improvement.  Notably, upon pulmonary review it was also felt  patient had a post viral bronchitis.  Antibiotics were therefore continued  as well.  Over the subsequent days she made only very slow improvement.  She  was continued on incentive spirometry regimen as well as intensive pulmonary  medicine and bronchodilators which she tolerated.  Despite this she would  still have marked dyspnea with only minimal activity.  The patient's blood  sugars did increase with the use of steroids.  This was managed well with  the sliding scale insulin regimen and adjustment of her diabetic medication.   The patient notably as she became more mobile did experience episodes of  what she  described as a fullness in her chest with a bubble effect.  This  was felt to be consistent with her previously documented esophageal spasm.  The possibility of Prinzmetal's angina could not be excluded.  However,  telemetry showed no significant arrhythmias.  She was subsequently restarted  on Imdur regimen as well as her Tiazac was continued in lieu of her ongoing  tachycardia.   The patient notably was seen in follow-up by Casimiro Needle B. Wert, M.D. [facility] of  pulmonary medicine and it was felt that she did indeed have a combination of  asthma with hypoventilation syndrome with associated deconditioning.  It is  felt that continued therapy with nebulizers was appropriate.  She was also  started on a flutter valve for mobilization of her secretions.  It was  recommended thereafter that she be tapered off prednisone and her oral  steroid inhalers be maximized.   Notably, with replacement of potassium patient was still slow to normalize  her potassium levels.  On further questioning it was learned that patient  had indeed been using the albuterol on a more active basis than was  previously believed.  It was felt that part of her hypokalemia was secondary  to the excessive albuterol effect.  This was discouraged as well.  Eventually, her potassium levels were stabilized with a large amount  required for replenishment.  Notably, with this she did feel somewhat better  strength wise, though she still had considerable problem with exertional  dyspnea.   The patient was also seen on follow-up with the pulmonary team.  The  contribution of reflux to her symptoms could not be totally assessed.  She  was maintained on maximum PPI agents with antireflux measures.   The patient gradually made more improvement with activity.  She was seen by  physical therapy with goals of energy conservation.  She tolerated  ambulation poorly without O2.  It was subsequently recommended that she continue her O2 when  ambulating for now.   The patient did undergo a sleep study while in the hospital.  She notably  immediately at the start of the study had a desaturation to 56% on room air,  which improved after using a nebulizer and O2 being supplied.  Notably,  during the study itself she had no levels less than 90%.  She had only 1.7  RDI  with only eight obstructive apneic events being seen.  This was  interpreted as a mild case of sleep apnea.  The contribution of supplemental  O2 to her study results was not clear.   The patient continued to make efforts at ambulation while on monitoring in  the hospital.  On December 18 she had another severe asthma attack after  walking further than her normal state without supplemental oxygen.  Her  saturations during that time did drop below 88%.  The patient felt extremely  fatigued thereafter with increasing myalgias.  This did, however, gradually  subside as she continued on her nebulizers with DuoNeb adjuvant  intervention.   Pulmonary follow-up had no further suggestions for management at this time.  It was felt that she may need a Urecholine inhalation study, also  bronchodilators as an outpatient.  It was recommended, however, that patient  undergo a progressive exercise program for improving her deconditioned  status.  Possible follow-up with cardiology as well.  The patient was  subsequently felt to had obtained maximum benefit in the hospital and was  subsequently discharged home on November 30, 2002.  Her medical regimen  remained uncomplicated.    PRESENT MEDICATIONS:  1. Aldactone 25 mg p.o. b.i.d.  2. Glucotrol 10 mg b.i.d.  3. Glucophage XR 500 mg daily in the p.m.  4. Singulair 10 mg daily.  5. Valtrex 500 mg b.i.d.  6. Flexeril 10 mg t.i.d.  7. DuoNeb 3 cubic centimeters via handheld nebulizer q.4h. p.r.n.  8. Tiazac 240 mg q.a.m.  9. Protonix 40 mg b.i.d.  10.      Pulmicort two puffs b.i.d.  11.      Maxzide 25 mg daily.  12.       K-Dur 20 mEq q.i.d.  13.      Lactulose 30 cubic centimeters b.i.d.  14.      Xanax 0.5 mg t.i.d. p.r.n.  15.      She will continue on Duragesic patch at 75 mcg q.72h.  16.      Vicodin ES one q.6h. p.r.n. breakthrough pain.  17.      Tessalon Perles 200 mg t.i.d. p.r.n. cough.  18.      Tussionex 5 cubic centimeters q.8h. p.r.n. severe cough.   She will be maintained on a 4 g sodium 1800 calorie ADA diet.  Outpatient  physical therapy.  Follow up in the office in two weeks' time.  Efforts will  also be continued toward weight reduction as well.                                               Eric L. August Saucer, M.D.    ELD/MEDQ  D:  12/26/2002  T:  12/26/2002  Job:  [id number]

## 2011-04-30 NOTE — Assessment & Plan Note (Signed)
[facility]                               PULMONARY OFFICE NOTE   IO, DIEUJUSTE                       MRN:          [id number]  DATE:10/25/2006                            DOB:          [birth year]    PULMONARY/ALLERGY FOLLOWUP   PROBLEMS:  1. Asthma.  2. Vocal cord dysfunction.  3. Esophageal reflux.  4. Morbid obesity.  5. Anxiety.  6. Atypical chest pain.   HISTORY:  She has done quite well, remaining stable through the summer  without special problems.  About a month ago she dropped off of allergy  vaccine and Flovent, finding that she remained stable.  We discussed  medication options and I again discussed risk/benefit considerations of  allergy vaccine.  She decided that she did not want to be off of allergy  vaccine because she did not want to return to the kind of problems that she  associates with her time before that therapy.  I gave her permission to stay  off for observation but she again insisted she wanted to continue, so she is  instructed to restart at 0.05 mL of her 1:50 vaccine and rebuild as  directed.  She has not had cough, wheeze, chest pain, or acute events.   MEDICATION:  She continues with a very long medication list, essentially  significant for allergy vaccine, Flovent 220 as noted, albuterol rescue  inhaler, and she has p.r.n. oxygen at 2 L with drug intolerance to  NONSTEROIDAL ANTI-INFLAMMATORY DRUGS, NICKEL, SULFA, and to MORPHINE.   OBJECTIVE:  VITAL SIGNS:  Weight 321 pounds, BP 114/76, pulse regular 109,  room air saturation 98%.  GENERAL:  She is overweight but seems comfortable sitting at rest.  Speech  quality is normal with no stridor, no visible nasal discharge.  CHEST:  Quiet clear breath sounds, shallow consistent with body habitus.  HEART SOUNDS:  Regular without murmur or gallop.  EXTREMITIES:  Without cyanosis, clubbing or edema.   IMPRESSION:  Mild stable asthma.  I have felt there was  probably a vocal  cord dysfunction/anxiety component to some of her atypical symptoms but that  has settled down.   PLAN:  1. Flu vaccine discussed and given.  2. Resume Flovent 220 at one puff b.i.d.  3. Resume allergy vaccine at 0.05 mL, building back to 0.1 mL per vial per      week as discussed.  Risk/benefit issues of allergy vaccine were again      reviewed as noted, and her EpiPen was discussed.   Schedule return 6 months, earlier p.r.n.     Clinton D. Maple Hudson, MD, Tonny Bollman, FACP  Electronically Signed    CDY/MedQ  DD: 10/25/2006  DT: 10/25/2006  Job #: [id number]   cc:   Minerva Areola L. August Saucer, M.D.

## 2011-04-30 NOTE — H&P (Signed)
NAME:  Crystal [name], Crystal [name]                          ACCOUNT NO.:  [id number]   MEDICAL RECORD NO.:  [id number]                   PATIENT TYPE:  INP   LOCATION:  0363                                 FACILITY:  [facility]   PHYSICIAN:  Eric L. August Saucer, M.D.                  DATE OF BIRTH:  [birth year]   DATE OF ADMISSION:  01/14/2003  DATE OF DISCHARGE:                                HISTORY & PHYSICAL   CHIEF COMPLAINT:  Refractory asthma with severe cough.   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 with a  longstanding history of morbid obesity, hyperventilation syndrome, asthma,  diabetes mellitus and hypertension. She states she had made progress  recently with reduction in her recurring bouts of paroxysmal coughing and  asthma attacks up until 5 days ago. At that time she had been exposed to  young family members with upper respiratory infection. She noted the onset  of a tickle in her throat with increasing sinus congestion thereafter. Over  the subsequent days she had recurring bouts of severe asthma with paroxysmal  and refractory coughing, prompted by minimal activity. She acknowledged that  she would have some post nasal drainage with cough occasionally productive  of yellowish phlegm. This would also tend to trigger attacks.   The patient recently had seen Dr. Maple Hudson of pulmonary medicine and was placed  on foradil and magnesium sulfate. Despite this, however, her symptoms  progressed, and she subsequently came to the emergency room this morning.  She was treated with nebulizer treatments x3 with magnesium sulfate given as  well. The patient did stabilize. Due to the  severity of her symptoms,  however, she is subsequently admitted for further observation and treatment.   PAST MEDICAL HISTORY:  Her past medical history is well documented in old  records. She has:  1. Fibromyalgia.  2. Chronic  interstitial cystitis.  3. Morbid obesity.  4. Hyperventilation syndrome with marked deconditioning with recurrent bouts     of asthma.  5. Hypertension.  6. Diabetes mellitus.  7. 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  8. Chronic fatigue syndrome.  9. Reflex sympathetic dystrophy.  10.      Prinzmetal' s angina as well.  11.      She has previously been diagnosed with sleep apnea although not     confirmed by most recent studies.  12.      Irritable bowel syndrome as well.   PAST SURGICAL HISTORY:  Well documented in old records.  1. Status post left knee arthroscopy with bone shaving and debridement in     1992.  2. Status post colectomy in 1991.  3. Tubal ligation in 1989.  4. Recurrent bouts of bladder distention for interstitial cystitis in 1997,     1998, 1999 and 2000.  5. Past history of endometriosis as well.   REVIEW OF SYSTEMS:  As noted above. She has become more discouraged with her  recent multiple bouts of asthma and refractory cough. She notes that her  blood sugars have been running between 9 and 10 most recently. Review of  systems is also notable for having  increasing pruritus associated with the  use of her Duragesic patch. She has subsequently stopped using the patch due  to the severe episodes. The pain has notably increased. Vicodin does help.   ALLERGIES:  1. SULFUR.  2. NICKEL.  3. SHE HAS TOLERATED NSAIDS POORLY IN THE PAST.  4. SHE HAS HAD NAUSEA ASSOCIATED WITH MORPHINE SULFATE IN THE PAST WITHOUT     AN ACTUAL RASH BEING DOCUMENTED.   MEDICATIONS:  1. Glucotrol 10 mg p.o. b.i.d.  2. Glucophage 500 mg b.i.d.  3. Aldactone  25 mg p.o. b.i.d.  4. Singular 10 mg q.d.  5. Valtrex 500 mg b.i.d.  6. Flexeril 10 mg t.i.d.  7. Duoneb 3 cc via hand-held nebulizer q.4h.  8. Tiazac versus Cardizem LA 180 mg q.h.s.  9. Nexium 40 mg q. a.m.  10.      Flovent via air spacer 2 puffs b.i.d., 220 mcg.  11.      Maxzide 25 mg q.d.  12.       K-Dur 20 mEq q.i.d.  13.      Lactulose 30 cc b.i.d.  14.      Xanax 0.5 mg t.i.d. p.r.n.  15.      Elmiron 100 mg t.i.d.  16.      Allegra 180 mg q.d.  17.      Imdur 30 mg q. a.m.  18.      Duragesic patch 75 micrograms per hour every 72 hours.  19.      Vicodin ES 1 q.4-6h. p.r.n. breakthrough pain.  20.      Tessalon pearls 200 mg t.i.d. p.r.n. cough.  21.      She has also used Tussionex 5 cc q.8h. p.r.n. cough.  22.      Midrin protocol for migraine headaches.  23.      Lidoderm patch 5% p.r.n. pain.   PHYSICAL EXAMINATION:  GENERAL:  She is a well developed, overweight black  female, presently in no acute distress after a recent paroxysmal asthma  attack. Height 5 feet, 8 inches. Weight 315 pounds.  VITAL SIGNS:  Blood pressure 131/85, pulse 110, respiratory rate 28,  temperature 97.5.  HEENT:  Normocephalic, atraumatic. Extraocular movements intact. She has  left frontal and left maxillary sinus tenderness greater than the right. TMs  with decreased light reflex but without erythematous changes. Nose, mild  turbinate edema, left greater than. Throat, posterior pharynx clear.  NECK:  Supple, no enlarged thyroid. She had small posterior cervical nodes  on the left versus the right.  LUNGS:  Notable for marked decreased inspiratory phase. She has got E:A  changes throughout. No frank wheezes appreciated at this time. No rales or  rhonchi noted.  CARDIOVASCULAR:  Normal S1, S2. No S3 or S4. No murmurs or rubs.  ABDOMEN:  Bowel sounds are present. Minimal tenderness in the left lower  quadrant. No masses.  MUSCULOSKELETAL:  She has marked hypersensitivity of the trapezoid muscles  as well as the anterior tibial region. No increased warmth. Negative  Homan's. No edema.  SKIN:  Notable for healing excoriations on her back.  NEUROLOGIC:  Alert and oriented x3. Cranial  nerves intact. Examination  nonfocal.  LABORATORY DATA:  Laboratory data pending. A chest x-ray shows marked   hypoaeration of the lung bases with increased bronchial markings consistent  with and/or suggestive of chronic bronchitis.   IMPRESSION:  1. Exercise induced asthma, presently refractory. Recently treated with     floredil and magnesium sulfate.  2. Acute and chronic bronchitis.  3. Obesity, hyperventilation syndrome with marked deconditioning.  4. Upper respiratory infection, rule out secondary sinusitis.  5. Diabetes mellitus with poor tolerance of oral or IV steroids.  6. Chronic fatigue syndrome.  7. History of sleep apnea.  8. Prinzmetal's angina.  9. Irritable bowel syndrome with constipation.  10.      Degenerative joint disease of L5-S1.  11.      Polypharmacy.  12.      History of migraine headaches.   PLAN:  The patient is being admitted for further stabilization  of her  asthma. Will continue Duoneb nebulizers on a q.4h. basis. Will place her on  Avelox 400 mg q.d. with expectorants. Will also start megadose Flovent at  880 mcg p.o. via Aerochamber/spacer device b.i.d.; will do this in lieu of  IV steroids. Will continue her other medications for now. Will have followup  with pulmonary for further suggestions. The possibility of transferring the  patient to a tertiary care facility has been discussed. The patient will  also be given a trial of Avinza for pain, as she is developing increasing  allergy reactions to the Duragesic patch. Further therapy pending the  response to the above.                                               Eric L. August Saucer, M.D.    ELD/MEDQ  D:  01/14/2003  T:  01/14/2003  Job:  [id number]

## 2011-04-30 NOTE — Discharge Summary (Signed)
NAME:  Crystal [name], Crystal [name]                          ACCOUNT NO.:  [id number]   MEDICAL RECORD NO.:  [id number]                   PATIENT TYPE:  INP   LOCATION:  0380                                 FACILITY:  [facility]   PHYSICIAN:  Eric L. August Saucer, M.D.                  DATE OF BIRTH:  12-22-1960   DATE OF ADMISSION:  10/22/2002  DATE OF DISCHARGE:  10/30/2002                                 DISCHARGE SUMMARY   DISCHARGE DIAGNOSES:  1. Exercise-induced bronchospasm, 493.81.  2. Myalgia and myositis, 729.1.  3. Morbid obesity, 278.01.  4. Diabetes mellitus type 2, 250.00.  5. Chest pain, 786.59.   OPERATIONS/PROCEDURES:  Stress Cardiolite study, Dr. Mayford Knife.   HISTORY OF PRESENT ILLNESS:  This is the second recent hospitalization for  this 50 year old married black female who had been doing well despite her  multiple medical problems until three weeks prior to admission.  At that  time she reported catching a cold from her daughter.  This evolved into  persistent coughing spells.  The cough was initially productive of yellowish  phlegm.  She was treated with Zithromax.  She had only transient  improvement, with return of the cough and increasing phlegm production as  well.  Over the past two weeks prior to admission her cough had increased  and had become very refractory to her home nebulizer treatments as well.  Recently she was noted to have a lot potassium, and this was corrected.  Despite these measures she had continued to have significant problems with  shortness of breath and coughing with minimal activity.  Notably, on further  questioning she was noted to be using her albuterol inhaler approximately  every 1 to 1-1/2 hours.  She subsequently was admitted for further  stabilization of her problems.   PAST MEDICAL HISTORY:  Per admission H&P.   PHYSICAL EXAMINATION:  Per admission H&P.   HOSPITAL COURSE:  The patient was admitted for further treatment of  refractory bronchospasm.   She was noted also to have significant hypokalemia  with a potassium of 3.0.  She had been previously been known to have  fibromyalgia with severe chronic pain.  This was associated with marked  exertional fatigue.  She notably also at the time of admission had abnormal  EKG with ST segment depressions inferiorly.  The patient was placed on  telemetry.  She was given Solu-Medrol acutely for severe bronchospasm as  well as use of DuoNeb nebulizers.  Because of the associated bronchitis she  was placed on Tequin at 400 mg p.o. q.d.  Her potassium was corrected over  the subsequent day as well.  In lieu of her diabetes she was started on  sliding scale insulin after receiving IV Solu-Medrol.  Over the subsequent  days the patient made gradual improvement of her severe bronchospasm.  Cardiac enzymes were negative for any signs of injury.  A  CT scan of her  sinuses was obtained as well to exclude occult disease, which was negative.  The patient was seen in consultation by cardiology after several days of  intensive therapy.  She was noted to have borderline EKG changes.  Her 2-D  echo was otherwise normal.  She was seen in consultation by Dr. Mayford Knife.  It  was known that she had a previous history of Prinzmetal's angina.  She  subsequently underwent a stress Cardiolite study.  She, unfortunately, with  this procedure had exercise-induced asthma requiring nebulizer therapies.  The Cardiolite study, however, showed no evidence for underlying ischemia.  It was recommended that her Imdur be increased further to treat her  presumptive Prinzmetal's angina.   With continued nebulizer and intensive pulmonary support, the patient did  make gradual improvement.  She continued, however, to have exertion-induced  bronchospasm although the episodes gradually tapered toward the end of the  hospital stay.  Her blood sugars did stabilize as her steroids were tapered  back down.  She did have the addition of  Theo-Dur to her regimen for  improved bronchodilatation.   The question of any other cause of her dyspnea was entertained.  She did  undergo a CT scan of her lungs, i.e., spiral CT scan, to exclude PE.  This  was felt to be equivocally low with low probability of PE.  The question of  small nonsignificant changes could not be excluded.  Intensive therapy was  continued.  She did have further evaluation of her lungs with pulmonary  function testing.  This demonstrated significant restrictive disease with  some mild obstruction noted as well.  Her overall medical regimen was  continued thereafter.  By October 30, 2002, she was felt to be stable for  discharge.  It is recognized that she had multiple medical problems, but  these will continue to be managed as an outpatient.  Medical regimen  remained complicated.   DISCHARGE MEDICATIONS:  1. Tequin 400 mg q.d. for seven days.  2. Glucotrol 10 mg b.i.d.  3. Glucophage 500 mg b.i.d.  4. Singulair 10 mg q.d.  5. Humibid L.A. 600 mg b.i.d. x 10 days.  6. Flexeril 10 mg p.o. t.i.d.  7. Elmiron 100 mg t.i.d.  8. Allegra 180 mg q.d.  9. Duragesic patch 75 mcg q.72h.  10.      Albuterol inhaler 2 puffs prior to exercise.  11.      Maxzide 50 mg q.d. p.r.n. edema.  12.      She will continue on Theo-Dur 300 mg b.i.d.  13.      Lactulose 3 tablespoons b.i.d.  14.      Xanax 0.5 mg t.i.d. p.r.n.  15.      Tussionex 5 cc q.8h. p.r.n. cough.  16.      Imdur 60 mg q.d.  17.      Vicodin ES 1 tablet t.i.d. p.r.n.  18.      K-Dur 20 mEq q.i.d.  19.      Tiazac 120 mg q.d.  20.      Nexium 40 mg q.a.m.  21.      Valtrex 500 mg b.i.d.  22.      Tessalon Perles t.i.d.  23.      DuoNeb 3 cc via hand-held nebulizer q.4h.  24.      Advair 500/50 1 puff b.i.d.  25.      She will be maintained on a sliding scale insulin regimen.  FOLLOW-UP:  She is to be seen in the office in two weeks' time.                                              Eric L.  August Saucer, M.D.    ELD/MEDQ  D:  12/05/2002  T:  12/06/2002  Job:  [id number]

## 2011-04-30 NOTE — Op Note (Signed)
[facility]  Patient:    Crystal [name], Crystal [name]                       MRN: [id number] Proc. Date: 09/09/00 Adm. Date:  [id number] Disc. Date: [id number] Attending:  Londell Moh CC:         Lind Guest. August Saucer, M.D.   Operative Report  PREOPERATIVE DIAGNOSES:  Interstitial cystitis.  POSTOPERATIVE DIAGNOSES:  Interstitial cystitis.  OPERATION:  Cystoscopy, urethral calibration, hydrodistention of bladder, fulguration of ulcers, Marcaine and Pyridium installation, Marcaine and Kenalog injection.  SURGEON:  Jamison Neighbor, M.D.  ANESTHESIA:  General.  COMPLICATIONS:  None.  INDICATIONS:  This 50 year old female has severe interstitial cystitis.  The patient has always responded to hydrodistention and had her last one over one year ago.  She has requested that a repeat hydrodistention be performed.  The patient understands the risks and benefits of the procedure including the fact that there is no guarantee that she will have improvement in her symptoms. The patient gave full and informed consent.  DESCRIPTION OF PROCEDURE:  After successful induction of general anesthesia, the patient was placed in the dorsal lithotomy, prepped with Betadine and draped in the usual sterile fashion.  The urethra was calibrated and accepted a 92 [place] urinary stent with no stenosis or stricture.  The urethra was in normal position, and there was no cystocele or rectocele and no mass on bimanual exam.  The bladder was carefully inspected.  It was free of any tumor or stone.  Both orifices were of normal configuration and location.  The bladder was then distended to a pressure of 100 cm of water for five minutes. When the bladder was drained there was not as many glomerulations as she has had in the past.  However, I did see definite Hunters ulcers which I subsequently fulgurated with a Bugbee electrode.  The patients bladder was drained.  A mixture of Marcaine and Pyridium  was injected in the bladder. Marcaine and Kenalog were injected periurethrally.  The patient tolerated the procedure well and was taken to the recovery room in good condition. DD:  09/09/00 TD:  09/10/00 Job: [id number]

## 2011-04-30 NOTE — Consult Note (Signed)
NAME:  Crystal [name], Crystal [name]                          ACCOUNT NO.:  [id number]   MEDICAL RECORD NO.:  [id number]                   PATIENT TYPE:  INP   LOCATION:  0359                                 FACILITY:  [facility]   PHYSICIAN:  Clinton D. Maple Hudson, M.D.              DATE OF BIRTH:  03-26-1961   DATE OF CONSULTATION:  11/20/2002  DATE OF DISCHARGE:                                   CONSULTATION   PROBLEM:  Refractory cough with asthma.   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  She describes her pattern as  paroxysmal cough until she retches, associated with substernal pain.  She  describes typical triggers as strong odors, movement, or activity, and  humidity.   She was originally diagnosed with asthma nine years ago and has had  occasional cough and wheeze.  An exacerbation began about six weeks ago when  she may have had a cold that seemed to be going through the family.  Her  daughter and husband definitely had one.  She remembers yellow sputum  turning down prior to treatment with Zithromax.  The cough, however,  persisted, and the sputum cleared and eventually she was hospitalized for  about 12 days, treated with Tequin and other medicines she does not know.  She noted modest improvement in her cough and had been home for two weeks,  still having coughing episodes.  She says she does fairly well between  episodes of coughing.  She has avoided systemic steroids, apparently, due to  her diabetes.  I am not sure what treatment she received in her previous  hospital stay.   REVIEW OF SYSTEMS:  Gets frustrated and angry that this process does not  resolve.  Husband notes some snoring and apnea while asleep but not coughing  or wheezing in her sleep.  She finds her nebulizer machine may help some but  triggers coughing.  Mild postnasal  drip.  Unaware of any reflux or  heartburn.  Has had some tussive right lateral rib pain.  Persistent  soreness in the sternum, which she relates to a history of Prinzmetal  angina.  Additional triggers for wheezing have included sexual intercourse  and exposure to smoke.  She feels that exercise and exertion is a most  common underlying issue.  Nonsteroidal anti-inflammatory drugs seem to  aggravate wheeze and cough, so she also avoids aspirin.  Denies problems  with latex.  No significant rash or itching.  No palpitations.  Gets hot and  feels better standing in front of an open window, but not having distinct  night sweats.   PAST MEDICAL HISTORY:  1. 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  2. Has had a previous barium swallow and echocardiogram, which she thinks  were unremarkable.  3. Does not think she has ever had pneumonia.  4. Denies TB exposure.  5. Fibromyalgia.  6. Chronic fatigue syndrome.  7. Interstitial cystitis.  8. 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  9. Restrictive lung disease on pulmonary function testing.  10.      Migraine headache.  11.      Sinusitis.  12.      Morbid obesity.  13.      Hiatal hernia with demonstrated intermittent reflux.  Dr. Diamantina Providence     note indicates that this does not include cough.  14.      Degenerative disk disease, lumbar spine.  15.      Reflex sympathetic dystrophy.  Endometriosis with hysterectomy.  16.      Obstructive sleep apnea, moderate, with repeat sleep study     pending.  She had not accepted treatments initially and has not tried     CPAP.  17.      Diabetes.   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  Never smoked.  Married.  Has a daughter.   FAMILY HISTORY:  Mother has asthma, cousin has asthma.  Both parents are  living.   HOME MEDICATIONS:  Glucotrol, Glucophage,  Singulair, Humibid LA, Flexeril,  Elmiron, Allegra, Duragesic patch, albuterol inhaler two puffs q.i.d.  p.r.n., Maxzide 75/50, Theo-Dur 300 mg b.i.d., lactulose, Xanax t.i.d.  p.r.n.,  Tussionex, Imdur, Vicodin, K-Dur, Tiazac, Nexium, Valtrex,  Tessalon, nebulizer with DuoNeb every four hours p.r.n., Advair 500/50 mcg,  Midrin for headache, Lidoderm patch, albuterol inhaler p.r.n., lidocaine and  Bentyl IV one time weekly, Nubain p.r.n. migraine.   PHYSICAL EXAMINATION:  VITAL SIGNS:  Temperature 98.4, BP 116/72, pulse  regular at 109, sinus tachycardia by monitor, respirations about 18,  unlabored.  GENERAL:  Obese, alert, attentive woman, in no acute distress.  Significantly noted to have no coughing whatsoever while I was with her.  SKIN:  No rash.  ADENOPATHY:  None found at the neck, shoulders, or axillae.  HEENT:  No JVD or stridor.  Nasal airway patent.  Conjunctivae clear.  Tongue coated with mild yeast, with thick tongue base obscuring posterior  pharynx.  CHEST:  Shallow breaths consistent with her obesity.  No wheezing, cough,  rales, or rhonchi noted.  No increased work of breathing.  CARDIAC:  Regular rhythm.  Muffled sounds, but no murmur or gallop.  ABDOMEN:  Quite obese, nontender, bowel sounds present.  EXTREMITIES:  No cyanosis, clubbing, or edema.   LABORATORY DATA:  CBC significant for 2% eosinophils on differential.   Chest x-ray:  Clear to my review.  Mild subsegmental atelectasis at the  bases consistent with obesity hypoventilation.   IMPRESSION:  1. Cough-equivalent asthma, either her primary pattern or reflecting medical     treatment of bronchospasm, leaving only superficial mucosal irritability     manifest by cough.  2. Postviral bronchitis.  This pattern could also be seen after Mycoplasma.     I think adult pertussis is less likely, but this also can yield a     lingering cough. 3. I cannot exclude anxiety, allergic rhinitis, or reflux as factors.   She     meets demographic features which often result in vocal cord dysfunction     as the basis for cough.  She is not demonstrating a pattern of wheeze,     but it does raise the question of whether some  of her described episodes     of cough and anxiety and reflux may actually be primary anxiety events.     This is a conclusion of exclusion.  4. Hiatal hernia with demonstrated esophageal reflux, leaving the     significant possibility that she is having sterile episodes of reflux.     Her acid blocker will reduce gastric acidity but would not prevent reflux     of gastric juice.  A 24-hour pH probe and possibly upper endoscopy should     be considered.  5. Morbid obesity with obesity hypoventilation.  6. Obstructive sleep apnea, possibly medically significant but not directly     causing the symptoms of which she complains now.  7. Prinzmetal angina with uncertain relation to symptoms.  8. She had a CT scan recently, which was a little equivocal because of     technique problems and does not completely exclude small peripheral     clots.  She should be on deep vein thrombosis prophylaxis with     consideration of Dopplers for leg pains if this has not been done.   The first therapeutic direction to be considered, now _________ her diabetic  response could be tracked and dealt with, is to give her steroids.  Unless  utility of this approach has been thoroughly excluded in the past, I would  suggest starting either with intravenous steroids or, because of rapid  absorption at this point, it may be just as effective to start her on  prednisone in the range of 20 mg t.i.d., tapering slowly over eight to 10  days.   Thank you for the chance to meet this patient.  I would be happy to discuss  her care with you.                                               Clinton D. Maple Hudson, M.D.    CDY/MEDQ  D:  11/20/2002  T:  11/20/2002  Job:  [id number]   cc:   Minerva Areola L. August Saucer, M.D.  [address] [address]   [place]  [place] [address]  Fax: [phone]

## 2011-04-30 NOTE — H&P (Signed)
NAME:  Crystal [name], Crystal [name]                          ACCOUNT NO.:  [id number]   MEDICAL RECORD NO.:  [id number]                   PATIENT TYPE:  INP   LOCATION:  0380                                 FACILITY:  [facility]   PHYSICIAN:  Eric L. August Saucer, M.D.                  DATE OF BIRTH:  11-30-1961   DATE OF ADMISSION:  10/22/2002  DATE OF DISCHARGE:                                HISTORY & PHYSICAL   CHIEF COMPLAINT:  Refractory cough, increasing shortness of breath.   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  At that time, she reports contacting a cold from her daughter. This  evolved into persistent coughing spells. The cough was initially productive  of yellowish phlegm. The patient was treated for Zithromax for a bronchitis  type picture. She had only transient improvement with return of her cough  with increasing phlegm production as well. Over the past two weeks, she has  had increasing cough which has been apparently refractory to home Nebulizer  treatments. She was seen in the office five days ago and was noted to have a  low potassium at 2.5. The patient was admitted to [facility] briefly.  During that time, her potassium was corrected partially to 3.0. The patient  did subsequently leave the hospital voluntarily at that time due to  medication problems. Since that time, she has continued to have recurrent  cough with shortness of breath. On further questioning, she has been using  her albuterol inhaler approximately every hour to 1 1/2 hours. She contacted  the office and was readmitted today for further stabilization of her problem  as she continued to have significant shortness of breath and weakness.   PAST MEDICAL HISTORY:  Quite complicated. She has a long standing history of  fibromyalgia,  chronic interstitial cystitis. History of sleep apnea from  1997 when she has not had a recent evaluation. The patient has morbid  obesity as well. She was recently diagnosed with diabetes mellitus for which  she has been managed with oral medication.   History also significant for having dental problems within the past month.  There is a question of early abscess in the left upper molar region as well.  The patient was due to go back to her dentist, Dr. Azucena Kuba for follow-up of  this.   REVIEW OF SYSTEMS:  As noted above. She acknowledges having had some reflux  and/or heartburn symptoms intermittently over the past few weeks. She has  intermittent chest tightness with a sensation of fullness occurring as well.  Denies any anginal type symptoms.   The patient has had longstanding fibromyalgia as noted. She has been  followed closely by Dr. Elvin So, neurologist treating fibromyalgia. She has  quite an involved medication regimen as well.   PAST SURGICAL HISTORY:  1. Notable for a cholecystectomy in 1987.  2. Status post hysterectomy in 1992.  3. She has had al knee arthroscopy in 1992.  4. In 1997, she had a pubovaginal sling.  5. Status post myomectomy.  6. Has had multiple laparoscopies in the past for endometriosis.  7. The patient has also been documented to have Prinzmetal angina in 1999.  8. History of pulmonary restricted disease as well.  9. Chronic constipation as well.   Past medical history otherwise as noted. She has had interstitial cystitis  since 1997. Fibromyalgia with chronic fatigue syndrome diagnosed in 1997.  Degenerative disk disease at L5-S1 as well.   PHYSICAL EXAMINATION:  GENERAL:  She is an anxious, obese, black female in  moderate respiratory distress.  VITAL SIGNS:  Reveal blood pressure of 141/81. Pulse 109. Respiratory rate  of 22. Temperature of 97.2. Systolic pressure of 141, diastolic of 81.  HEENT:  Head normocephalic, atraumatic. She has bilateral  frontal and left  maxillary sinus tenderness. Marked tenderness over the left upper mandible  and upper molar region. TMs with decreased light reflux on the left versus  right. She has grade 1 fundi. Nose bilateral turbinate edema left greater  than right. Throat, mouth oropharyngeal erythema.  NECK:  Supple. No enlarged thyroid. She has small posterior cervical nodes  bilaterally tender to palpation.  LUNGS:  Notable for a marked decreased diaphragmatic excursion. No  expiratory wheezes appreciated. She has decreased breath sounds at bases.  Scattered U to A changes at the base. She has marked tenderness in the right  rib region at T11 and T10. This area is aggravated with the cough.  CARDIOVASCULAR:  Shows normal S1 and S2, no S3. Occasional rapid rate up to  110 noted. No murmurs or rubs appreciated.  ABDOMEN:  Bowel sounds present. Abdomen is tympanic. No enlarged liver or  spleen, masses or tenderness.  RECTAL:  Deferred.  MUSCULOSKELETAL:  She has marked tenderness in the trapezoid muscles and the  subclavian regions. She has multiple trigger points in the trapezoid region  as well as deltoid and anterior thigh as well as lower extremities. No edema  appreciated, however.  NEUROLOGIC:  She is alert and oriented x3. Cranial nerves are intact despite  some pain.  PSYCHIATRIC:  The patient is anxious, still quite bitter over her experience  at the previous hospital. She was allowed to ventilate to some extent.   LABORATORY DATA:  CBC reveals a WBC of 21,500, hemoglobin 12.9, hematocrit  of 38.9. RDW is 15.3. Electrolytes 16% neutrophils, 3.7% lymphs, 1.7%,  monos. Chemistry, sodium 136, potassium 3.0 which is low, coag 103, CO2 29,  BUN 14, creatinine 1.0, glucose of 89. Calcium 8.7, total protein 7.3,  albumin 3.7.   EKG normal sinus rhythm with ST segment depression inferiorly lead 3 AVF. T wave inversions lead 3 as well. EKG is abnormal with inferior ischemia  suggested. Chest  x-ray pending.   IMPRESSION:  1. Refractory atypical bronchospasm.  2. Acute and chronic asthma.  3. Rule out adverse medication effect. She has been over dosing on     albuterol. This can also rarely aggravate bronchospasm.  4. Esophageal reflux disease.  5. Sinusitis with allergic rhinitis. Patient with obvious post nasal     drainage by history.  6. Fibromyalgia with chronic pain syndrome.  7. Chronic fatigue syndrome.  8. Sleep apnea previously mild, rule out progression.  9.  Diabetes mellitus. Blood sugars are expected to increase with Solu-Medrol     use.  10.      Morbid obesity.  11.      Situational stress.  12.      Chronic pain medication use. She had been followed previously by     Dr. Elvin So of neurology for her fibromyalgia picture.   PLAN:  Will treat her bronchospasm acutely with Solu-Medrol and a  combination of DuoNeb albuterol nebulizers. Will place her on Tequin at this  time 400 mg p.o. q.d. She will need further correction of her persistent  hypokalemia. CT scan of the ____________ retraction. She will be on a  sliding scale insulin regimen for control of her elevated blood sugars being  aggravated by IV Solu-Medrol. Will attempt to streamline her medication  regimen during the hospital stay. She will need a repeat sleep study in the  near future as well. Will recommend a 2-D echo for the patient as well as  she does have a borderline EKG. Will need to rule out silent ischemia.  Further therapy obtained in response to the above.                                               Eric L. August Saucer, M.D.    ELD/MEDQ  D:  10/22/2002  T:  10/23/2002  Job:  [id number]

## 2011-04-30 NOTE — Discharge Summary (Signed)
NAME:  Crystal [name], Crystal [name]                          ACCOUNT NO.:  [id number]   MEDICAL RECORD NO.:  [id number]                   PATIENT TYPE:  INP   LOCATION:  3728                                 FACILITY:  [facility]   PHYSICIAN:  Eric L. August Saucer, M.D.                  DATE OF BIRTH:  04/03/1961   DATE OF ADMISSION:  10/19/2002  DATE OF DISCHARGE:  10/21/2002                                 DISCHARGE SUMMARY   FINAL DIAGNOSES:  1. Acute asthma, 493.92.  2. Myalgias and myositis, 729.1.  3. Chronic fatigue syndrome, 780.71.  4. Lumbosacral degenerative disk disease, 722.52.  5. Morbid obesity, 278.01.  6. Type 2 diabetes mellitus, 250.00.  7. Hypokalemia, 276.8.  8. Unspecified sleep apnea, 780.57.  9. Anxiety state, 300.00.   OPERATIONS AND PROCEDURES:  None.   HISTORY OF PRESENT ILLNESS:  This was the first recent [facility]  admission for this 50 year old married black female with multiple medical  problems who presented to the office complaining of increasing refractory  cough for two weeks duration.  Cough was associated with shortness of breath  as well.  This has been accompanied by progressive weakness.  The patient  had previously been stable despite multiple medical problems prior to that  time.  There have been no increased signs of congestion or drainage.  She  subsequently did develop a cough spontaneously.  This had transient increase  sputum production.  She was unable to rest supine over the subsequent days.  She used her home nebulizers without significant improvement.  Due to  further progression of her symptoms, the patient came to the office and was  admitted thereafter.   PAST MEDICAL HISTORY:  Well documented in admission H&P.   HOSPITAL COURSE:  The patient was admitted for further evaluation and  control of her refractory cough and shortness of breath.  Her clinical  picture was most consistent with an atypical asthma and with exercise  induced asthma  with associated bronchitis.  Notably also, she had  significant hypokalemia with a potassium of 2.5.  Sugar was notably low at  44 at the time of presentation.   The patient was admitted to telemetry for further evaluation.  She was  started on Solu-Medrol intermittently for control of her bronchospasm.  She  was placed on Tequin 400 mg p.o. q.d.  Potassium was replaced with IV runs  as well as oral intake as she had significant difficulty of maintaining an  IV.  With continued therapy, however, the patient did make some improvement.  Her potassium was slowly improved with replacement.  She notably had some  significant nervousness with the nebulizer treatments, and she was changed  to Xopenex.  She was also placed on low-dose Cardizem for her rapid heart  rate as well.  With this, she did improve.   Notably, the patient had difficulty obtaining her home  medications through  the hospital formulary.  This caused some disagreement in terms of her  medical management.  The patient subsequently opted for discharge, and this  was granted per Dr. Algie Coffer.   At the time of discharge, she was not having severe paroxysmal asthma  attacks.  Her O2 saturation on room air was 96%.   The patient was subsequently discharged on 10/21/02, on the following  regimen:   DISCHARGE MEDICATIONS:  1. Tequin 400 mg p.o. q.d. x1 week.  2. Prednisone 10 mg three tablets q.d. x3 days, then two tablets q.d. x4     days, then one q.d. x5 days.  3. She will be maintained on Advair 250 mg two puffs b.i.d.  4. Humibid LA 600 mg b.i.d.  5. Albuterol and Atrovent hand held nebulizer q.i.d.  6. Glucotrol XL 100 mg p.o. b.i.d.  7. Singular 10 mg q.d.  8. Flexeril 10 mg t.i.d.  9. Imdur 30 mg q.d.  10.      Allegra 180 mg q.d.  11.      Glucophage 500 mg b.i.d.  12.      Valtrex 500 mg one tablet b.i.d.  13.      K-Dur 20 mEq two b.i.d.  14.      Cardizem CD 180 mg q.d.  15.      Diovan 80 mg q.d.  16.       Elmiron 100 mg q.d.  17.      Insulin coverage as necessary.   DIET:  The patient will maintain a low fat, low salt, 1600 calorie ADA diet.   FOLLOWUP:  She will need a basic metabolic panel in one weeks' time.  Follow  up in our office in one week's time.                                               Eric L. August Saucer, M.D.    ELD/MEDQ  D:  11/21/2002  T:  11/22/2002  Job:  [id number]

## 2011-04-30 NOTE — Op Note (Signed)
Crystal [name], Crystal [name] NO.:  [id number]   MEDICAL RECORD NO.:  [id number]          PATIENT TYPE:  AMB   LOCATION:  [facility]                         FACILITY:  [facility]   PHYSICIAN:  Jamison Neighbor, M.D.  DATE OF BIRTH:  1961/05/03   DATE OF PROCEDURE:  01/01/2005  DATE OF DISCHARGE:                                 OPERATIVE REPORT   PREOPERATIVE DIAGNOSIS:  Interstitial cystitis.   POSTOPERATIVE DIAGNOSIS:  Interstitial cystitis.   PROCEDURE:  Cystoscopy, urethral calibration, hydrodistention of the  bladder, Marcaine and Pyridium instillation, Marcaine and Kenalog injection.   SURGEON:  Jamison Neighbor, M.D.   ANESTHESIA:  General.   COMPLICATIONS:  None.   DRAINS:  None.   BRIEF HISTORY:  This 50 year old female is known to have interstitial  cystitis.  The patient has responded quite nicely to hydrodistention but has  responded, at best, poorly to other forms of intervention.  The patient has  requested that repeat hydrodistention be performed.  It has been quite some  time since her last distention, and given the benefit she has accrued in the  past, it seems reasonable to consider that as a therapeutic option.  She is  aware of the fact there is no guarantee that she will have the same  improvement and that there may not be any long-term improvement in her  bladder with this distention.  She understands the risks and benefits of the  procedure and gave full, informed consent.  Due to the distance the patient  lives from the hospital as well as her breathing problems, it is felt best  to admit her for observation following the procedure.   PROCEDURE:  After the successful induction of general anesthesia, the  patient is placed in a dorsal lithotomy position, prepped with Betadine, and  draped in the usual sterile fashion.  Careful bimanual examination revealed  the patient does have a cystocele with primarily a central defect.  The  urethra was in pretty  good position.  There were no signs of a diverticulum.  There were no real rectocele.  The urethra was calibrated to a 30 [place]  with female urethral sounds.  The cystoscope was inserted.  The bladder was  carefully inspected.  It was free of any tumor or stones.  Both ureteral  orifices were normal in configuration and location.  Hydrodistention of the  bladder was performed.  The patient has surprisingly good bladder capacity,  it is just over 900 cc, but glomerulations could be seen throughout the  bladder with drain-out.  The patient did not require a biopsy.  A mixture of  Marcaine and  Pyridium was left in the bladder.  Marcaine and Kenalog were injected  periurethrally.  The patient tolerated the procedure well and was taken to  the recovery room in good condition.  She will have 23-hour observation and  be sent home in the morning.      RJE/MEDQ  D:  01/01/2005  T:  01/01/2005  Job:  [id number]

## 2011-04-30 NOTE — Discharge Summary (Signed)
NAME:  BERNARD, DONAHOO                          ACCOUNT NO.:  [id number]   MEDICAL RECORD NO.:  [id number]                   PATIENT TYPE:  INP   LOCATION:  0363                                 FACILITY:  [facility]   PHYSICIAN:  Eric L. August Saucer, M.D.                  DATE OF BIRTH:  07/05/1961   DATE OF ADMISSION:  01/14/2003  DATE OF DISCHARGE:  01/22/2003                                 DISCHARGE SUMMARY   FINAL DIAGNOSES:  1. Exercise-induced bronchospasm, 493.81.  2. Chronic interstitial cystitis, 595.1.  3. 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  4. Myalgia myositis, 729.1.  5. Morbid obesity, 278.01.  6. Hypertension, 401.9.  7. Diabetes mellitus, type 2, non-insulin-dependent, 250.00.  8. Lumbosacral spondylosis, 721.3.  9. Chronic fatigue syndrome, 780.71.  10.      Pruritic disorders, 698.9.  11.      Acute upper respiratory infection, 465.9.  12.      Psychogenic respiratory disease.  13.      Irritable colon, 564.1.  14.      Unspecified constipation, 564.00.  15.      Esophageal reflux, 530.1.   OPERATIONS AND PROCEDURES:  None.   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  She states she had made progress  recently with reduction in her recurrent bouts of paroxysmal coughing and  asthma attacks until the five days prior to admission.  At that time, she  had been exposed to young family members with upper respiratory infection.  She noted onset of sore throat, increasing sinus congestion.  This was  followed by recurrent bouts of severe asthma which were paroxysmal and  refractory coughing, prompted by minimal activity.  This did not improve  with her medications.  Her symptoms progressed to the point of requiring  further admission.  Notably, she had recently seen  Clinton D. Young, M.D. of  pulmonary medicine in follow-up and was placed on floredil and magnesium  sulfate.  Despite this, however, her symptoms did progress.   PAST MEDICAL HISTORY AND PHYSICAL EXAMINATION:  Per admission H&P.   HOSPITAL COURSE:  The patient was admitted for further treatment of severe  recurrent bouts of exercise-induced asthma with associated refractory  coughing spells as well.  It was felt that clinically, she also had signs of  an acute and chronic bronchitis, based on her chest x-ray findings at the  time of visitation.  She also had an upper respiratory infection by history.  In lieu of her refractory nature, the patient was started on a high dose  Flovent mega-dose at 880 mcg p.o. via Aerochamber b.i.d.  She was also  placed on Avalox and expectorants.  She was continued on the nebulizer  therapy as  well.  In lieu of her recent change in medications, she was  placed back on the floredil as well as the magnesium sulfate.   The patient also was seen urgently in acute consultation by Clinton D.  Maple Hudson, M.D.  The above-noted recommendations were reviewed.  It was felt  that efforts would be made to get patient back to her baseline with a  subsequent referral to  [facility] for [facility].   Over the subsequent days, the patient made very slow improvement.  She had  recurrent bouts of severe bronchospasm with minimal activity.  She was  treated for reflux as well as ongoing therapy for her bronchitis and gross  nasal drainage.  Issues regarding constipation were addressed as well.   With continued supportive measures, the patient eventually made steady  progress where she was able to gradually increase her activity.  If she did  attempt to walk without oxygen, she would have severe bronchospasms with  subsequent bouts of asthma.  Eventually with adjustment of her regimen, she  did stabilize to the point that she was able to go home.  Plans were made,   as mentioned, for her to undergo further evaluation as an outpatient at  [facility].  By 01/21/03, she was relatively stable.  She was still  having considerable difficulty performing basic activities of daily living.  This will be followed upon as an outpatient.   MEDICATIONS AT THE TIME OF DISCHARGE:  1. Glucotrol 5 mg p.o. b.i.d.  2. Glucophage 500 mg b.i.d.  3. Cardizem CD 180 mg daily.  4. Flovent 320 mcg 4 puffs b.i.d.  5. Tessalon 200 mg p.o. t.i.d.  6. Singulair 10 mg daily.  7. Imdur 30 mg daily.  8. Allegra 180 mg daily.  9. Elmiron 100 mg t.i.d.  10.      Xanax 0.5 mg t.i.d.  11.      Magnesium oxide 400 mg b.i.d.  12.      K-Dur 20 mEq q.i.d.  13.      Flexeril 10 mg t.i.d.  14.      MS Contin 90 mg q.12h.  15.      Senokot-S 4 tabs q.h.s.  16.      Protonix 80 mg b.i.d.  17.      Triamterine/hydrochlorothiazide 25 mg daily.  18.      Chronulac 30 mL b.i.d.  19.      She will be maintained on Vicodin ES 1-2 q.6h. p.r.n. breakthrough     pain.  20.      She is to continue her DuoNeb at 3 mL via hand-held nebulizer q.4h.  21.      Floredil nebulizer before activity q.12h.  22.      She will be maintained on a sliding-scale insulin regimen with     Humulin R.   ACTIVITY:  As tolerated.   DIET:  She will be maintained on a 4 g sodium 1800 calorie ADA diet.    FOLLOW UP:  The patient will be seen in our office in two weeks' time for  follow-up.  Outpatient evaluation with Dr. Arlyn Dunning at [facility]  for her ongoing pulmonary problems.  It is recognized that the patient has  multiple medical problems complicating her care.  Eric L. August Saucer, M.D.    ELD/MEDQ  D:  03/13/2003  T:  03/13/2003  Job:  [id number]

## 2011-04-30 NOTE — Op Note (Signed)
Crystal [name], Crystal [name]                ACCOUNT NO.:  [id number]   MEDICAL RECORD NO.:  [id number]          PATIENT TYPE:  AMB   LOCATION:  [facility]                         FACILITY:  [facility]   PHYSICIAN:  Jamison Neighbor, M.D.  DATE OF BIRTH:  05-02-1961   DATE OF PROCEDURE:  08/19/2006  DATE OF DISCHARGE:                                 OPERATIVE REPORT   PREOPERATIVE DIAGNOSIS:  Interstitial cystitis/painful bladder syndrome.   POSTOPERATIVE DIAGNOSIS:  Interstitial cystitis/painful bladder syndrome.   PROCEDURE:  Cystoscopy, urethral calibration, hydrodistention of the  bladder, Marcaine and Pyridium instillation, Marcaine and Kenalog injection.   SURGEON:  Jamison Neighbor, M.D.   ANESTHESIA:  General.   COMPLICATIONS:  None.   DRAINS:  None.   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  The patient is status post  cystoscopy and hydrodistention approximately 18 months ago.  She is now to  undergo repeat cystoscopy and hydrodistention because she got such excellent  relief of her pain when she had that done previously.  The patient  understands the risks and benefits of the procedure and gave full informed  consent.  She is going to stay in the hospital for 22 hour observation  postoperatively as requested by anesthesia.   PROCEDURE:  After successful induction of general anesthesia, the patient  was placed in the dorsal lithotomy position, prepped with Betadine and  draped in the usual sterile fashion.  Careful bimanual examination showed no  irregularities of the urethra.  There were no signs of a diverticulum.  There were no masses detected.  The patient was found to have a normal  urethra with no signs of diverticulum or stenosis or stricture.  The  cystoscope was inserted.  The bladder was carefully inspected and free of  any tumors or stones.  Both ureteral orifices were normal in configuration  and location.   The bladder was distended to a pressure of 100 cm of water  and was found to be normal.  The patient did not have any evidence of  glomerulations.  The bladder capacity was a little over 1000 mL.  This  really is much, much improved to what she had previously before she was  started on Elmiron therapy.  The patient's bladder was drained.  A mixture  of Marcaine and Pyridium was left in the bladder.  Marcaine and Kenalog were  injected periurethrally.  The patient tolerated the procedure and was taken  to the recovery room in good condition.  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  We  note the patient is not compliant about her CPAP but clearly has well-  documented sleep apnea most likely caused by her obesity.  When we send the  patient home, she will be informed that her bladder really has normalized.  We will discuss with her the possibility of decreasing her Elmiron dose  which is quite high at 200 t.i.d.  and will likely send her home with no additional medications.  Clearly,  it  would be ideal if we could eliminate some of the patient's extensive list of  medications but some of that will need to be dealt with by her primary care  physician.           ______________________________  Jamison Neighbor, M.D.  Electronically Signed     RJE/MEDQ  D:  08/19/2006  T:  08/19/2006  Job:  [id number]   cc:   Minerva Areola L. August Saucer, M.D.  Fax: [phone]

## 2011-04-30 NOTE — H&P (Signed)
NAME:  Crystal [name], Crystal [name]                          ACCOUNT NO.:  [id number]   MEDICAL RECORD NO.:  [id number]                   PATIENT TYPE:  INP   LOCATION:  0359                                 FACILITY:  [facility]   PHYSICIAN:  Eric L. August Saucer, M.D.                  DATE OF BIRTH:  11-May-1961   DATE OF ADMISSION:  11/19/2002  DATE OF DISCHARGE:                                HISTORY & PHYSICAL   CHIEF COMPLAINT:  Progressive exertional dyspnea, refractory cough,  progressive weakness.   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  She had stabilized in the hospital after being treated for atypical  asthma with paroxysmal cough.  She states, however, after going home she had  noted persistence of her symptoms after several days.  This would occur with  bouts of coughing and wheezing with only minimal activity.  If she did minor  exertion, laughed, or moved suddenly she would get short of breath with  coughing.  She denies significant fever, no chills or night sweats.  She has  been using her nebulizers as well as multiple medications.  Despite this,  however, she noted no significant improvement.  Over the past several days  she had progressive weakness.  This was associated with a light-headedness  when standing.  She also described episodes of the room spinning with her  becoming nauseated and flushed as well.  There has been no associated  palpitations.  The patient states she had been taking her medication as  directed.  She did stop her Duragesic patch for several days thinking this  was aggravating her orthostasis.  She noted no improvement of her symptoms  and no change in her pain.  She subsequently restarted this.   She has had occasional cough productive of whitish-yellow sputum.  Denies  significant headaches or earaches.   PAST MEDICAL HISTORY:  Previously documented in her last admission note.  1. She carries a history of fibromyalgia since 1997.  2. Chronic fatigue syndrome.  3. She has been followed by urologists for interstitial cystitis.  4. She has had recurring episodes of exacerbation of pain with bladder     distentions.  5. 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  She has had a     cardiac catheterization showing normal coronaries otherwise.  6. Most recently, evaluations have demonstrated significant restrictive lung     disease.  She has had ongoing problems with low ventilatory capacity.     Notably, when discharged home she was given an incentive spirometer which     she has used approximately three times.  She states her maximum     inspiratory force was approximately 500 cc.  States she  did develop chest     discomfort with attempting to use incentive spirometer as well.  7. Migraine headaches.  8. She has had bouts of sinusitis in the past.  9. Presently is suffering from morbid obesity with generalized inactivity as     well.  10.      Hiatal hernia with intermittent reflux.  Treatment of this notably     has not improved cough symptoms.  11.      History of degenerative disk disease of L5-S1 diagnosed in 1997.  12.      History of reflex sympathetic dystrophy.  13.      History of endometriosis in 1989 resulting in hysterectomy.  14.      Sleep apnea.  She is scheduled to have a repeat sleep study done on     11/22/02.  She had not previously required CPAP.   PAST SURGICAL HISTORY:  1. Status post cholecystectomy.  2. Status post hysterectomy.   REVIEW OF SYMPTOMS:  Otherwise as noted above.  She had previously been a  Engineer, civil (consulting).  She has subsequently become disabled with fibromyalgia greater then  10 years ago.  She has been seen by several physicians for treatment of her  fibromyalgia.   History otherwise positive for irritable bowel  syndrome with predominant  constipation problems.   MEDICATIONS:  Multiple and presently consist of:  1. Glucotrol 10 mg b.i.d. with Glucophage 500 mg b.i.d.  2. Singular 10 mg q.d.  3. Humibid LA 600 mg b.i.d.  4. Flexeril 10 mg t.i.d.  5. Elmiron 100 mg t.i.d.  6. Allegra 180 mg q.d.  7. Duragesic patch at 75 mcg q.72h.  8. Albuterol meter dosed inhaler 2 puffs prior to exercise.  9. Maxzide 75/50 one q.d. p.r.n. edema.  She has been currently using every     day.  10.      Theo-Dur 300 mg b.i.d.  11.      Lactulose 3 tablespoons b.i.d.  12.      Xanax 0.5 mg t.i.d. p.r.n.  13.      Tussionex 5 cc q.8h. p.r.n. severe cough.  14.      Imdur 60 mg q.d. for esophageal spasms.  15.      Vicodin ES one tablet t.i.d. p.r.n. pain.  16.      K-Dur 20 mEq q.i.d.  17.      Tiazac 120 mg q.d.  18.      She also has been on Nexium 40 mg p.o. q.a.m.  19.      Valtrex 500 mg b.i.d.  20.      Tessalon pearls two capsules t.i.d. p.r.n. cough.  21.      She remains on duo-neb nebulizer 3 cc q.i.d. to q.4h.  22.      Advair 500/50 one puff b.i.d.  23.      The patient has been on a mission protocol for migraine headaches,     Lidoderm patch periodically for control of musculoskeletal pain.  She     also requests for consultation.   The patient has been followed previously by Dr. Thomasena Edis of neurology (out of  town).  She has been receiving lidocaine injections in terms to trigger  points with Benadryl.  She has had Nubain in the past for migraine  headaches.   PHYSICAL EXAMINATION:  GENERAL:  She is an obese black female, on admission  in acute distress, presently stable.  VITAL SIGNS:  Height 5 feet 8 inches,  weight 321 pounds.  Blood pressure is  110/80, pulse of 114, respiratory rate of 22 to 24.  O2 saturation of 99% on  2 L.  HEENT:  Head normocephalic, atraumatic.  Extraocular movements were intact with right lateral nystagmus and associated vertigo.  She has mild frontal  sinus  tenderness bilaterally.  Tympanic membranes are clear bilaterally.  Throat:  She has a whitish tinge to her tongue with some erythematous  changes noted.  She does have thrush.  NECK:  There is tenderness in the submantle gland versus the left.  She has  no significant cervical nodes appreciated.  LUNGS:  Decreased breath sounds diffusely.  She has scattered E to A changes  throughout her lungs.  No wheezes appreciated or rubs.  CARDIOVASCULAR:  Normal S1 and S2. slightly rapid rate.  No S3 or S4.  She  has a 1/6 systolic ejection murmur in the lower left sternal border.  There  is mild chest wall tenderness to deep palpation.  ABDOMEN:  Bowel sounds are present.  She has tenderness in the right upper  quadrant.  There is dullness in the left lower quadrant.  Abdomen otherwise  tympanic to percussion (this is improved).  MUSCULOSKELETAL:  She has decreased range of motion of the shoulders and  knees.  She has scattered trigger points on her trapezoid muscles, lower  back, deltoid, tendon sites, as well as anterior thighs.  Notably, there is  no edema of her lower extremities.  Tenderness in the calves bilaterally  without definite cords.  Negative Homans.  NEUROLOGIC:  She is alert and oriented x3.  Cranial nerves II-XII intact.  Strength poorly tested secondary to complaints of pain with activity.  SKIN:  Without active lesions.   LABORATORY DATA:  CBC reveals white blood cell count of 11,700, hemoglobin  14.0, hematocrit 41.5, 69 polys, 22% lymphs.  Chemistries showed sodium of  134, potassium 3.0, chloride 96, CO2 28, BUN 6, creatinine 1.0, glucose 114.  SGOT and SGPT within normal limits.  Total protein 8.2, albumin 4.0.  Calcium 9.7, magnesium 2.2.  Theophylline was low at 6.9.  Chest x-ray  pending.   IMPRESSION:  1. Exercise induced asthma, recurrent.  2. Severe restrictive lung disease, multifactorial.  3. Hypokalemia, symptomatic.  4. Fibromyalgia, severe with use of  multiple medications.  5. Diabetes mellitus.  6. Subtherapeutic Theophylline.  7. Interstitial cystitis requiring multiple regimen for control.  8. Morbid obesity.  9. Irritable bowel syndrome.  10.      Degenerative disk disease of the lumbosacral spine.  11.      Depression.  12.      History of sleep apnea.  13.      Progressive deconditioning.   PLAN:  We will admit the patient for further evaluation.  We will collect  her electrolytes.  Intensify and maximize her pulmonary regimen.  We will  simplify regimen wherever possible.  We will obtain pulmonary consultation.  The patient may benefit from pulmonary rehabilitation, i.e., energy  conservation techniques as well.  Orthostatic blood pressure checks as well.  We will discontinue her Maxzide and start spironolactone b.i.d.  I will also  taper her Imdur and start heart dose Tiazac.  She may use nitroglycerin p.r.n. muscle spasms, most likely from esophageal at that time.  Further  therapy thereafter.  Pulmonary consultation as noted.  Eric L. August Saucer, M.D.    ELD/MEDQ  D:  11/19/2002  T:  11/20/2002  Job:  [id number]

## 2011-04-30 NOTE — Consult Note (Signed)
NAME:  Crystal [name], Crystal [name]                          ACCOUNT NO.:  [id number]   MEDICAL RECORD NO.:  [id number]                   PATIENT TYPE:  INP   LOCATION:  0380                                 FACILITY:  [facility]   PHYSICIAN:  Armanda Magic, M.D.                  DATE OF BIRTH:  11/03/1961   DATE OF CONSULTATION:  DATE OF DISCHARGE:                                   CONSULTATION   <REFERRING PHYSICIAN/>  Eric L. August Saucer, M.D.   REASON FOR CONSULTATION:  Abnormal EKG and chest pain.   HISTORY OF PRESENT ILLNESS:  This is a very pleasant 50 year old black  female with a history of fibromyalgia, questionable Prinzmetal's angina in  the past. Apparently  she had a heart catheterization in 1998 or 1999 for  chest pain by Dr. Elsie Lincoln which showed coronary vasospasm. She has been on  long acting nitrates, Imdur 30 mg q.d. since then. She also has a history of  sleep apnea, diabetes mellitus and morbid obesity. She was admitted with  refractory atypical bronchospasm and coughing with asthma. She also had a  history of gastroesophageal reflux disease.   She  had some chest pain  in the hospital and an EKG was done which showed T-  wave inversions in lead 3 and AVF, T-wave flattening in the precordial  leads. A 2D echocardiogram was done which was completely  normal and  cardiac enzymes have been negative for approximately four to five times. Of  note she says her chest pain is exactly what she has had with her  Prinzmetal's angina in the past. She has had it for seven years. It has not  changed in frequency, duration or intensity and is about the same as it has  been.   PAST MEDICAL HISTORY:  1. Significant for fibromyalgia.  2. Chronic interstitial cystitis.  3. Sleep apnea.  4. Morbid obesity.  5. Diabetes mellitus.  6. Prinzmetal's angina.  7. Pulmonary restrictive disease.  8. Chronic constipation.  9. Gastroesophageal reflux disease.   PAST SURGICAL HISTORY:  1. Status post  cholecystectomy in 1987.  2. Status post hysterectomy in 1992.  3. Status post knee arthroscopy in 1992.  4. Pubovaginal sling in 1997.  5. Status post myomectomy.  6. History of multiple laparoscopies for endometriosis.   SOCIAL HISTORY:  She does not smoke or drink. She is disabled due to her  medical problems.   ALLERGIES:  1. SULFA.  2. IBUPROFEN.  3. MORPHINE.   MEDICATIONS:  1. Allegra 180 mg q.d.  2. Amaryl 4 mg 1-1/2 tablets q.d.  3. Voltex 500 mg b.i.d.  4. Flexeril 10 mg q.i.d.  5. Zaroxolyn 2.5 mg p.r.n. for edema.  6. Maxzide 75/50 mg 1 p.o. q.d.  7. Mexiletine 150 mg q.i.d. per her neurologist.  8. Vivactil 10 mg t.i.d. p.r.n.  9. Imdur 30 mg q.d.  10.  Elmiron 100 mg q.d.  11.      K-Dur 20 mEq b.i.d.  12.      Vicodin ES 0.5 mg q.4h. p.r.n. pain.  13.      Midrin.  14.      Advair discus 250/50, 1 puff b.i.d.  15.      Lidoderm patch 5% p.r.n.   REVIEW OF SYSTEMS:  Other that what is stated in the HPI is negative.   PHYSICAL EXAMINATION:  VITAL SIGNS:  Blood pressure 139/92, pulse 88,  respiratory rate 24, O2 saturations 99% on 2 liters.  GENERAL:  This is a well developed morbidly obese black female in no  acute  distress.  HEENT:  Benign.  NECK:  Supple, no bruits.  LUNGS:  Clear to auscultation bilaterally.  HEART:  Regular rate and rhythm, no murmurs, rubs, gallops, normal S1, S2.  ABDOMEN:  Obese but benign.  EXTREMITIES:  No edema.   LABORATORY DATA:  CPKs were 63, 85, 111, 132, 142, MBs 1.2, 1.3, 1.5, 1.0  and 0.9. Troponins were not done. White blood cell count 20.4, hemoglobin  12.7, hematocrit 38.3, platelet count 371. Sodium 138, potassium 3.4,  chloride 103, bicarbonate 29, BUN 17, creatinine 1, glucose 91.   A chest x-ray shows bronchitis.   ASSESSMENT:  1. Atypical chest pain with normal cardiac enzymes, normal 2D  echocardiogram and EKG with nonspecific T-wave and serially with flattened T-  waves in the precordial leads. The  patient does have a history of  Prinzmetal's angina by catheter in 1998 by Dr. Elsie Lincoln, and her chest pain  has not changed in frequency, duration or severity in the past seven years.  1. History of gastroesophageal reflux disease.  2. Bronchitis.   PLAN:  1. Stress Cardiolite study to rule out inducible ischemia, although I think     unlikely. I think her chest pain is just due     to her basic Prinzmetal's angina.  2. I am going to increase her Imdur to 60 mg q.d. so that if this is     Prinzmetal's angina she is having, this should help improve vasospasm.                                                 Armanda Magic, M.D.    TT/MEDQ  D:  10/25/2002  T:  10/25/2002  Job:  [id number]   cc:   Minerva Areola L. August Saucer, M.D.  [address] [address]  [place]  [place] [address]  Fax: [phone]

## 2011-07-06 ENCOUNTER — Ambulatory Visit
Admission: RE | Admit: 2011-07-06 | Discharge: 2011-07-06 | Disposition: A | Discharge status: Home / Self Care | Payer: 59 | Source: Ambulatory Visit | Attending: Internal Medicine | Admitting: Internal Medicine

## 2011-07-06 ENCOUNTER — Other Ambulatory Visit: Payer: Self-pay | Admitting: Internal Medicine

## 2011-07-06 DIAGNOSIS — R0600 Dyspnea, unspecified: Secondary | ICD-10-CM

## 2011-07-22 ENCOUNTER — Other Ambulatory Visit: Payer: Self-pay | Admitting: Internal Medicine

## 2011-07-22 ENCOUNTER — Ambulatory Visit
Admission: RE | Admit: 2011-07-22 | Discharge: 2011-07-22 | Disposition: A | Discharge status: Home / Self Care | Payer: 59 | Source: Ambulatory Visit | Attending: Internal Medicine | Admitting: Internal Medicine

## 2011-07-22 DIAGNOSIS — J984 Other disorders of lung: Secondary | ICD-10-CM

## 2011-07-22 DIAGNOSIS — R918 Other nonspecific abnormal finding of lung field: Secondary | ICD-10-CM

## 2011-07-22 MED ORDER — IOHEXOL 300 MG/ML  SOLN: 75.0000 mL | Freq: Once | INTRAMUSCULAR | Status: AC | PRN

## 2011-09-07 LAB — CBC
HCT: 44.8
Hemoglobin: 14.6
Platelets: 379
WBC: 6.9

## 2011-09-07 LAB — BASIC METABOLIC PANEL
BUN: 2 — ABNORMAL LOW
GFR calc non Af Amer: >60
Potassium: 3.4 — ABNORMAL LOW
Sodium: 140

## 2011-09-07 LAB — DIFFERENTIAL
Eosinophils Relative: 1
Lymphocytes Relative: 26
Lymphs Abs: 1.8

## 2011-09-13 LAB — COMPREHENSIVE METABOLIC PANEL
ALT: 18
ALT: 14
ALT: 20
AST: 13
AST: 20
Albumin: 3.2 — ABNORMAL LOW
Albumin: 3.2 — ABNORMAL LOW
Alkaline Phosphatase: 83
Alkaline Phosphatase: 73
Alkaline Phosphatase: 83
BUN: 5 — ABNORMAL LOW
BUN: 5 — ABNORMAL LOW
BUN: 6
CO2: 29
CO2: 30
CO2: 31
CO2: 32
CO2: 33 — ABNORMAL HIGH
Calcium: 9.1
Chloride: 98
Chloride: 101
Chloride: 101
Chloride: 102
Chloride: 102
Chloride: 102
Creatinine, Ser: 0.79
Creatinine, Ser: 0.83
Creatinine, Ser: 0.87
GFR calc Af Amer: >60
GFR calc Af Amer: >60
GFR calc non Af Amer: >60
GFR calc non Af Amer: >60
GFR calc non Af Amer: >60
GFR calc non Af Amer: >60
GFR calc non Af Amer: >60
Glucose, Bld: 156 — ABNORMAL HIGH
Glucose, Bld: 91
Potassium: 3.2 — ABNORMAL LOW
Potassium: 4.2
Sodium: 137
Sodium: 140
Sodium: 140
Total Bilirubin: 0.8
Total Bilirubin: 0.4
Total Bilirubin: 0.4
Total Bilirubin: 0.4
Total Bilirubin: 0.5

## 2011-09-13 LAB — GLUCOSE, CAPILLARY
Glucose-Capillary: 100 — ABNORMAL HIGH
Glucose-Capillary: 105 — ABNORMAL HIGH
Glucose-Capillary: 107 — ABNORMAL HIGH
Glucose-Capillary: 111 — ABNORMAL HIGH
Glucose-Capillary: 121 — ABNORMAL HIGH
Glucose-Capillary: 127 — ABNORMAL HIGH
Glucose-Capillary: 132 — ABNORMAL HIGH
Glucose-Capillary: 85
Glucose-Capillary: 85
Glucose-Capillary: 88
Glucose-Capillary: 93
Glucose-Capillary: 94
Glucose-Capillary: 97

## 2011-09-13 LAB — URINALYSIS, ROUTINE W REFLEX MICROSCOPIC
Glucose, UA: NEGATIVE
Ketones, ur: NEGATIVE
Specific Gravity, Urine: 1.006
pH: 7

## 2011-09-13 LAB — DIFFERENTIAL
Basophils Absolute: 0.1
Basophils Absolute: 0.1
Basophils Absolute: 0.2 — ABNORMAL HIGH
Basophils Relative: 1
Basophils Relative: 1
Basophils Relative: 1
Eosinophils Absolute: 0.1
Eosinophils Absolute: 0.3
Eosinophils Relative: 2
Lymphs Abs: 2.9
Monocytes Absolute: 0.9
Monocytes Relative: 8
Monocytes Relative: 6
Monocytes Relative: 7
Neutro Abs: 7.4
Neutro Abs: 8.4 — ABNORMAL HIGH
Neutrophils Relative %: 76
Neutrophils Relative %: 63
Neutrophils Relative %: 66

## 2011-09-13 LAB — CBC
HCT: 40.7
HCT: 37.4
HCT: 38.3
Hemoglobin: 13.5
Hemoglobin: 12.3
MCHC: 31.8
MCHC: 33
MCV: 83.8
MCV: 84.5
Platelets: 317
Platelets: 336
RBC: 4.91
RBC: 4.42
RBC: 4.56
RBC: 4.86
RDW: 15.5
WBC: 11.2 — ABNORMAL HIGH
WBC: 12.2 — ABNORMAL HIGH
WBC: 13.4 — ABNORMAL HIGH
WBC: 13.7 — ABNORMAL HIGH

## 2011-09-13 LAB — LIPID PANEL
Cholesterol: 128
LDL Cholesterol: 37
Triglycerides: 202 — ABNORMAL HIGH

## 2011-09-13 LAB — RAPID URINE DRUG SCREEN, HOSP PERFORMED
Amphetamines: NOT DETECTED
Barbiturates: NOT DETECTED
Benzodiazepines: NOT DETECTED
Cocaine: NOT DETECTED
Opiates: POSITIVE — AB
Tetrahydrocannabinol: NOT DETECTED

## 2011-09-13 LAB — URINE CULTURE: Special Requests: NEGATIVE

## 2011-09-28 ENCOUNTER — Ambulatory Visit (HOSPITAL_BASED_OUTPATIENT_CLINIC_OR_DEPARTMENT_OTHER): Payer: 59 | Attending: Internal Medicine

## 2011-09-28 DIAGNOSIS — G4733 Obstructive sleep apnea (adult) (pediatric): Secondary | ICD-10-CM | POA: Insufficient documentation

## 2011-09-28 DIAGNOSIS — I491 Atrial premature depolarization: Secondary | ICD-10-CM | POA: Insufficient documentation

## 2011-09-28 DIAGNOSIS — G4737 Central sleep apnea in conditions classified elsewhere: Secondary | ICD-10-CM | POA: Insufficient documentation

## 2011-10-02 DIAGNOSIS — G4737 Central sleep apnea in conditions classified elsewhere: Secondary | ICD-10-CM

## 2011-10-02 DIAGNOSIS — I491 Atrial premature depolarization: Secondary | ICD-10-CM

## 2011-10-02 DIAGNOSIS — G4733 Obstructive sleep apnea (adult) (pediatric): Secondary | ICD-10-CM

## 2011-10-02 NOTE — Procedures (Signed)
NAME:  TILLY, PERNICE NO.:  [id number]  MEDICAL RECORD NO.:  [id number]          PATIENT TYPE:  OUT  LOCATION:  SLEEP CENTER                 FACILITY:  [facility]  PHYSICIAN:  Kambryn Dapolito D. Maple Hudson, MD, FCCP, FACPDATE OF BIRTH:  November 14, 1960  DATE OF STUDY:  09/28/2011                           NOCTURNAL POLYSOMNOGRAM  REFERRING PHYSICIAN:  Eric L. August Saucer, M.D.  REFERRING PHYSICIAN:  Eric L. August Saucer, MD  INDICATION FOR STUDY:  Hypersomnia with sleep apnea.  EPWORTH SLEEPINESS SCORE:  15/24.  BMI 35, weight 231 pounds, height 68 inches, neck 15 inches.  HOME MEDICATIONS:  Charted and reviewed.  SLEEP ARCHITECTURE:  Total sleep time 282 minutes with sleep efficiency 73.9%.  Stage I was 8.3%, stage II 70.7%, stage III absent, REM 20.9% of total sleep time.  Sleep latency 81.5 minutes, REM latency 45.5 minutes, awake after sleep onset 19 minutes, arousal index 17.  BEDTIME MEDICATION:  Methadone, Klor-Con, Elmiron, Colon Clenz, erythromycin.  RESPIRATORY DATA:  Apnea-hypopnea index (AHI) 8.3 per hour.  A total of 39 events was scored including 2 central apneas and 37 hypopneas.  Most events were associated with nonsupine sleep position and REM.  REM AHI 13.2 per hour.  Insufficient early sleep was recorded to permit application of CPAP titration by split protocol on this study night.  OXYGEN DATA:  No snoring was noted by the technician.  Oxygen desaturation to a nadir of 86% and a mean oxygen saturation through the study of 92.7% on room air.  CARDIAC DATA:  Sinus rhythm with occasional PAC.  MOVEMENT-PARASOMNIA:  No significant movement disturbance.  No bathroom trips.  IMPRESSION-RECOMMENDATION: 1. Sleep onset was delayed until nearly 12:30 a.m. and sleep was     fragmented by brief spontaneous wakings until nearly 2:30 a.m.     Note, the bedtime medication included methadone. 2. Mild obstructive and central sleep apnea/hypopnea syndrome, AHI 8.3     per hour.   Events were seen in all sleep positions but more common     while nonsupine and in rapid eye movement.  No significant snoring     was noted by the technician.  Oxygen desaturation to a nadir of 86%     and mean saturation through the study of 92.7% on room air. 3. Delayed sleep onset and lower numbers of respiratory events     prevented application of CPAP titration by split protocol on this     study night.  Her apnea/hypopnea index would permit a trial of CPAP     therapy if clinically appropriate. 4. Delayed sleep onset and fragmented early sleep may reflect the     unfamiliar sleep environment.  If this is her routine pattern, then     some help with management as in insomnia might be clinically     beneficial as appropriate.     Aldair Rickel D. Maple Hudson, MD, [facility], FACP Diplomate, Biomedical engineer of Sleep Medicine Electronically Signed    CDY/MEDQ  D:  10/02/2011 09:28:31  T:  10/02/2011 09:42:25  Job:  [id number]

## 2011-10-04 ENCOUNTER — Other Ambulatory Visit: Payer: Self-pay | Admitting: Internal Medicine

## 2011-10-04 DIAGNOSIS — R634 Abnormal weight loss: Secondary | ICD-10-CM

## 2011-10-04 DIAGNOSIS — R109 Unspecified abdominal pain: Secondary | ICD-10-CM

## 2011-10-06 ENCOUNTER — Ambulatory Visit
Admission: RE | Admit: 2011-10-06 | Discharge: 2011-10-06 | Disposition: A | Discharge status: Home / Self Care | Payer: Medicare Other | Source: Ambulatory Visit | Attending: Internal Medicine | Admitting: Internal Medicine

## 2011-10-06 DIAGNOSIS — R634 Abnormal weight loss: Secondary | ICD-10-CM

## 2011-10-06 DIAGNOSIS — R109 Unspecified abdominal pain: Secondary | ICD-10-CM

## 2012-02-18 LAB — TSH: TSH: 2.39 u[IU]/mL (ref <=5.90)

## 2012-02-18 LAB — HEMOGLOBIN A1C: Hgb A1c MFr Bld: 5.8 % (ref 4.0–6.0)

## 2012-02-18 LAB — BASIC METABOLIC PANEL
Glucose: 86 mg/dL
Sodium: 141 mmol/L (ref 137–147)

## 2012-03-28 LAB — HM DIABETES EYE EXAM

## 2012-05-11 ENCOUNTER — Encounter (HOSPITAL_BASED_OUTPATIENT_CLINIC_OR_DEPARTMENT_OTHER): Payer: Self-pay | Admitting: *Deleted

## 2012-05-11 ENCOUNTER — Emergency Department (HOSPITAL_BASED_OUTPATIENT_CLINIC_OR_DEPARTMENT_OTHER): Payer: 59

## 2012-05-11 ENCOUNTER — Emergency Department (HOSPITAL_BASED_OUTPATIENT_CLINIC_OR_DEPARTMENT_OTHER)
Admission: EM | Admit: 2012-05-11 | Discharge: 2012-05-11 | Disposition: A | Discharge status: Home / Self Care | Payer: 59 | Attending: Emergency Medicine | Admitting: Emergency Medicine

## 2012-05-11 DIAGNOSIS — E079 Disorder of thyroid, unspecified: Secondary | ICD-10-CM | POA: Insufficient documentation

## 2012-05-11 DIAGNOSIS — M549 Dorsalgia, unspecified: Secondary | ICD-10-CM

## 2012-05-11 DIAGNOSIS — G40909 Epilepsy, unspecified, not intractable, without status epilepticus: Secondary | ICD-10-CM | POA: Insufficient documentation

## 2012-05-11 DIAGNOSIS — M546 Pain in thoracic spine: Secondary | ICD-10-CM | POA: Insufficient documentation

## 2012-05-11 DIAGNOSIS — J45909 Unspecified asthma, uncomplicated: Secondary | ICD-10-CM | POA: Insufficient documentation

## 2012-05-11 DIAGNOSIS — R079 Chest pain, unspecified: Secondary | ICD-10-CM | POA: Insufficient documentation

## 2012-05-11 DIAGNOSIS — Z79899 Other long term (current) drug therapy: Secondary | ICD-10-CM | POA: Insufficient documentation

## 2012-05-11 DIAGNOSIS — E119 Type 2 diabetes mellitus without complications: Secondary | ICD-10-CM | POA: Insufficient documentation

## 2012-05-11 HISTORY — DX: Other interstitial and deep keratitis, unspecified eye: H16.399

## 2012-05-11 HISTORY — DX: Disorder of thyroid, unspecified: E07.9

## 2012-05-11 HISTORY — DX: Fibromyalgia: M79.7

## 2012-05-11 HISTORY — DX: Unspecified convulsions: R56.9

## 2012-05-11 LAB — DIFFERENTIAL
Basophils Absolute: 0.1 10*3/uL (ref 0.0–0.1)
Eosinophils Absolute: 0.2 10*3/uL (ref 0.0–0.7)
Eosinophils Relative: 2 % (ref 0–5)
Lymphocytes Relative: 48 % — ABNORMAL HIGH (ref 12–46)
Monocytes Absolute: 0.9 10*3/uL (ref 0.1–1.0)

## 2012-05-11 LAB — BASIC METABOLIC PANEL
CO2: 33 mEq/L — ABNORMAL HIGH (ref 19–32)
Calcium: 9.2 mg/dL (ref 8.4–10.5)
Creatinine, Ser: 0.9 mg/dL (ref 0.50–1.10)
GFR calc non Af Amer: 73 mL/min — ABNORMAL LOW (ref >90)
Glucose, Bld: 82 mg/dL (ref 70–99)
Sodium: 138 mEq/L (ref 135–145)

## 2012-05-11 LAB — CBC
HCT: 43.7 % (ref 36.0–46.0)
MCH: 27.7 pg (ref 26.0–34.0)
MCV: 84.2 fL (ref 78.0–100.0)
Platelets: 281 10*3/uL (ref 150–400)
RDW: 13.9 % (ref 11.5–15.5)

## 2012-05-11 MED ORDER — IOHEXOL 350 MG/ML SOLN
80.0000 mL | Freq: Once | INTRAVENOUS | Status: AC | PRN
  Administered 2012-05-11: 80 mL via INTRAVENOUS

## 2012-05-11 NOTE — ED Notes (Signed)
Pt c/o upper back pain which radiates to front of chest , increased pain with movt, d dimer positive on Friday at MD office

## 2012-05-11 NOTE — Discharge Instructions (Signed)
Back Pain, Adult Low back pain is very common. About 1 in 5 people have back pain.The cause of low back pain is rarely dangerous. The pain often gets better over time.About half of people with a sudden onset of back pain feel better in just 2 weeks. About 8 in 10 people feel better by 6 weeks.  CAUSES Some common causes of back pain include:  Strain of the muscles or ligaments supporting the spine.   Wear and tear (degeneration) of the spinal discs.   Arthritis.   Direct injury to the back.  DIAGNOSIS Most of the time, the direct cause of low back pain is not known.However, back pain can be treated effectively even when the exact cause of the pain is unknown.Answering your caregiver's questions about your overall health and symptoms is one of the most accurate ways to make sure the cause of your pain is not dangerous. If your caregiver needs more information, he or she may order lab work or imaging tests (X-rays or MRIs).However, even if imaging tests show changes in your back, this usually does not require surgery. HOME CARE INSTRUCTIONS For many people, back pain returns.Since low back pain is rarely dangerous, it is often a condition that people can learn to manageon their own.   Remain active. It is stressful on the back to sit or stand in one place. Do not sit, drive, or stand in one place for more than 30 minutes at a time. Take short walks on level surfaces as soon as pain allows.Try to increase the length of time you walk each day.   Do not stay in bed.Resting more than 1 or 2 days can delay your recovery.   Do not avoid exercise or work.Your body is made to move.It is not dangerous to be active, even though your back may hurt.Your back will likely heal faster if you return to being active before your pain is gone.   Pay attention to your body when you bend and lift. Many people have less discomfortwhen lifting if they bend their knees, keep the load close to their  bodies,and avoid twisting. Often, the most comfortable positions are those that put less stress on your recovering back.   Find a comfortable position to sleep. Use a firm mattress and lie on your side with your knees slightly bent. If you lie on your back, put a pillow under your knees.   Only take over-the-counter or prescription medicines as directed by your caregiver. Over-the-counter medicines to reduce pain and inflammation are often the most helpful.Your caregiver may prescribe muscle relaxant drugs.These medicines help dull your pain so you can more quickly return to your normal activities and healthy exercise.   Put ice on the injured area.   Put ice in a plastic bag.   Place a towel between your skin and the bag.   Leave the ice on for 15 to 20 minutes, 3 to 4 times a day for the first 2 to 3 days. After that, ice and heat may be alternated to reduce pain and spasms.   Ask your caregiver about trying back exercises and gentle massage. This may be of some benefit.   Avoid feeling anxious or stressed.Stress increases muscle tension and can worsen back pain.It is important to recognize when you are anxious or stressed and learn ways to manage it.Exercise is a great option.  SEEK MEDICAL CARE IF:  You have pain that is not relieved with rest or medicine.   You have   pain that does not improve in 1 week.   You have new symptoms.   You are generally not feeling well.  SEEK IMMEDIATE MEDICAL CARE IF:   You have pain that radiates from your back into your legs.   You develop new bowel or bladder control problems.   You have unusual weakness or numbness in your arms or legs.   You develop nausea or vomiting.   You develop abdominal pain.   You feel faint.  Document Released: 11/29/2005 Document Revised: 11/18/2011 Document Reviewed: 04/19/2011 Exit[employer] Patient Information 2012 [employer]. 

## 2012-05-11 NOTE — ED Notes (Signed)
Pt. Gone to CT scanner.

## 2012-05-11 NOTE — ED Provider Notes (Addendum)
History     CSN: [id number]  Arrival date & time 05/11/12  1831   First MD Initiated Contact with Patient 05/11/12 1859      Chief Complaint  Patient presents with  . Back Pain    (Consider location/radiation/quality/duration/timing/severity/associated sxs/prior treatment) Patient is a 51 y.o. female presenting with back pain. The history is provided by the patient.  Back Pain  This is a new problem. Episode onset: 2 weeks ago. The problem occurs constantly. The problem has been gradually worsening. The pain is associated with no known injury. The pain is present in the thoracic spine. The quality of the pain is described as stabbing and shooting. Radiates to: Left chest. The pain is at a severity of 9/10. The pain is severe. The symptoms are aggravated by bending, twisting and certain positions. The pain is the same all the time. Stiffness is present all day. Associated symptoms include chest pain. Pertinent negatives include no fever, no numbness, no abdominal pain, no abdominal swelling, no dysuria, no leg pain and no weakness. She has tried analgesics for the symptoms. The treatment provided no relief.    Past Medical History  Diagnosis Date  . Diabetes mellitus   . Lumbago   . Interstitial and deep keratitis   . Thyroid disease   . Fibromyalgia   . Asthma   . Seizures     Past Surgical History  Procedure Date  . Abdominal surgery   . Tubal ligation   . Abdominal hysterectomy   . Bladder surgery   . Joint replacement   . Cholecystectomy     History reviewed. No pertinent family history.  History  Substance Use Topics  . Smoking status: Never Smoker   . Smokeless tobacco: Not on file  . Alcohol Use: No    OB History    Grav Para Term Preterm Abortions TAB SAB Ect Mult Living                  Review of Systems  Constitutional: Negative for fever.  Respiratory: Negative for cough and shortness of breath.   Cardiovascular: Positive for chest pain.    Gastrointestinal: Negative for nausea, vomiting and abdominal pain.  Genitourinary: Negative for dysuria.  Musculoskeletal: Positive for back pain.  Neurological: Negative for weakness and numbness.  All other systems reviewed and are negative.    Allergies  Morphine; Nsaids; and Sulfonamide derivatives  Home Medications   Current Outpatient Rx  Name Route Sig Dispense Refill  . LUBIPROSTONE 24 MCG PO CAPS Oral Take 24 mcg by mouth 2 (two) times daily with a meal.      . METFORMIN HCL ER 750 MG PO TB24 Oral Take 750 mg by mouth daily.        BP 129/82  Pulse 63  Temp(Src) 97.7 F (36.5 C) (Oral)  Resp 16  Ht 5\' 8"  (1.727 m)  Wt 234 lb (106.142 kg)  BMI 35.58 kg/m2  SpO2 100%  Physical Exam  Nursing note and vitals reviewed. Constitutional: She is oriented to person, place, and time. She appears well-developed and well-nourished. No distress.  HENT:  Head: Normocephalic and atraumatic.  Mouth/Throat: Oropharynx is clear and moist.  Eyes: Conjunctivae and EOM are normal. Pupils are equal, round, and reactive to light.  Neck: Normal range of motion. Neck supple.  Cardiovascular: Normal rate, regular rhythm and intact distal pulses.   No murmur heard. Pulmonary/Chest: Effort normal and breath sounds normal. No respiratory distress. She has no wheezes. She has  no rales.   She exhibits tenderness. She exhibits no crepitus, no deformity and no swelling.    Abdominal: Soft. She exhibits no distension. There is no tenderness. There is no rebound and no guarding.  Musculoskeletal: Normal range of motion. She exhibits no edema and no tenderness.  Neurological: She is alert and oriented to person, place, and time.  Skin: Skin is warm and dry. No rash noted. No erythema.  Psychiatric: She has a normal mood and affect. Her behavior is normal.    ED Course  Procedures (including critical care time)  Labs Reviewed  CBC - Abnormal; Notable for the following:    RBC 5.19 (*)     All other components within normal limits  DIFFERENTIAL - Abnormal; Notable for the following:    Neutrophils Relative 39 (*)    Lymphocytes Relative 48 (*)    All other components within normal limits  BASIC METABOLIC PANEL - Abnormal; Notable for the following:    CO2 33 (*)    GFR calc non Af Amer 73 (*)    GFR calc Af Amer 84 (*)    All other components within normal limits   Dg Chest 2 View  05/11/2012  *RADIOLOGY REPORT*  Clinical Data: Upper back pain radiating to the front of the chest. Increased pain with movement.  Positive D-dimer.  Asthma.  CHEST - 2 VIEW  Comparison: Previous examinations.  Findings: The heart remains normal in size and the lungs are clear. Thoracic spine degenerative changes.  Cholecystectomy clips.  IMPRESSION: No acute abnormality.  Original Report Authenticated By: Darrol Angel, M.D.   Ct Angio Chest W/cm &/or Wo Cm  05/11/2012  *RADIOLOGY REPORT*  Clinical Data: Pain radiating to the left side of the chest. Elevated D-dimer.  Diabetes and asthma.  CT ANGIOGRAPHY CHEST  Technique:  Multidetector CT imaging of the chest using the standard protocol during bolus administration of intravenous contrast. Multiplanar reconstructed images including MIPs were obtained and reviewed to evaluate the vascular anatomy.  Contrast: 80mL OMNIPAQUE IOHEXOL 350 MG/ML SOLN  Comparison:  Chest 05/11/2012.  CT chest 07/22/2011.  Findings: Low lung fields are clear.  There is no infiltrate or nodule.  No edema is seen.  There is no pleural effusion.  The heart is normal.  There is no coronary calcification.  No pericardial effusion is present.  Normal aorta and great vessels.  Normal pulmonary artery and branches.  No evidence for pulmonary emboli.  Unremarkable hilar and mediastinal regions.  Previous identified right axillary adenopathy has partially resolved.  Negative esophagus without visible air fluid level or hiatal hernia.  Mild degenerative change thoracic spine.  Unremarkable  sternum and ribs.  IMPRESSION: CTA chest is negative for pulmonary emboli, acute cardiopulmonary disease, or other significant finding.  Stable exam compared with previous CT chest in 2012.  Original Report Authenticated By: Elsie Stain, M.D.     Date: 05/11/2012  Rate: 63  Rhythm: normal sinus rhythm  QRS Axis: normal  Intervals: normal  ST/T Wave abnormalities: normal  Conduction Disutrbances:none  Narrative Interpretation:   Old EKG Reviewed: unchanged    1. Back pain       MDM   Patient with back pain in the left scapular region that started about 2 weeks ago in his progressively worsening. She is not short of breath or having other symptoms such as abdominal pain, dizziness or productive cough. She was seen by her physician and had a positive d-dimer however no further testing was  initiated. She states the pain has persisted and is not improving. She denies any lower extremity extremity edema or pain and no prior history of clots. Based on the positive d-dimer she had at her PCPs office will get a CT to rule out PE.  However based on exam and the nature of her pain feel most likely that this is musculoskeletal.  9:09 PM Labs are unremarkable and CT of the chest was negative for PE or other acute features. This was discussed with the patient and she will continue taking her methadone for the pain and follow up with her PCP Dr. Nedra Hai, MD 05/11/12 9147  Gwyneth Sprout, MD 05/11/12 2110

## 2012-11-27 ENCOUNTER — Encounter: Payer: Self-pay | Admitting: Hematology

## 2012-11-27 DIAGNOSIS — M797 Fibromyalgia: Secondary | ICD-10-CM

## 2012-11-27 DIAGNOSIS — R208 Other disturbances of skin sensation: Secondary | ICD-10-CM | POA: Insufficient documentation

## 2012-11-27 DIAGNOSIS — E039 Hypothyroidism, unspecified: Secondary | ICD-10-CM | POA: Insufficient documentation

## 2012-11-27 DIAGNOSIS — E119 Type 2 diabetes mellitus without complications: Secondary | ICD-10-CM

## 2012-11-27 DIAGNOSIS — K589 Irritable bowel syndrome without diarrhea: Secondary | ICD-10-CM | POA: Insufficient documentation

## 2013-07-13 ENCOUNTER — Other Ambulatory Visit: Payer: Self-pay | Admitting: Internal Medicine

## 2013-07-16 ENCOUNTER — Other Ambulatory Visit: Payer: Self-pay | Admitting: Internal Medicine

## 2013-07-16 DIAGNOSIS — E559 Vitamin D deficiency, unspecified: Secondary | ICD-10-CM

## 2013-07-27 ENCOUNTER — Ambulatory Visit
Admission: RE | Admit: 2013-07-27 | Discharge: 2013-07-27 | Disposition: A | Discharge status: Home / Self Care | Payer: 59 | Source: Ambulatory Visit | Attending: Internal Medicine | Admitting: Internal Medicine

## 2013-07-27 DIAGNOSIS — E559 Vitamin D deficiency, unspecified: Secondary | ICD-10-CM

## 2014-09-25 ENCOUNTER — Emergency Department (HOSPITAL_BASED_OUTPATIENT_CLINIC_OR_DEPARTMENT_OTHER)
Admission: EM | Admit: 2014-09-25 | Discharge: 2014-09-25 | Disposition: A | Discharge status: Home / Self Care | Payer: Medicare Other | Attending: Emergency Medicine | Admitting: Emergency Medicine

## 2014-09-25 ENCOUNTER — Emergency Department (HOSPITAL_BASED_OUTPATIENT_CLINIC_OR_DEPARTMENT_OTHER): Payer: Medicare Other

## 2014-09-25 ENCOUNTER — Encounter (HOSPITAL_BASED_OUTPATIENT_CLINIC_OR_DEPARTMENT_OTHER): Payer: Self-pay | Admitting: Emergency Medicine

## 2014-09-25 DIAGNOSIS — Y92009 Unspecified place in unspecified non-institutional (private) residence as the place of occurrence of the external cause: Secondary | ICD-10-CM | POA: Insufficient documentation

## 2014-09-25 DIAGNOSIS — G629 Polyneuropathy, unspecified: Secondary | ICD-10-CM | POA: Insufficient documentation

## 2014-09-25 DIAGNOSIS — S93401A Sprain of unspecified ligament of right ankle, initial encounter: Secondary | ICD-10-CM | POA: Insufficient documentation

## 2014-09-25 DIAGNOSIS — G40909 Epilepsy, unspecified, not intractable, without status epilepticus: Secondary | ICD-10-CM | POA: Insufficient documentation

## 2014-09-25 DIAGNOSIS — S93509A Unspecified sprain of unspecified toe(s), initial encounter: Secondary | ICD-10-CM

## 2014-09-25 DIAGNOSIS — E119 Type 2 diabetes mellitus without complications: Secondary | ICD-10-CM | POA: Insufficient documentation

## 2014-09-25 DIAGNOSIS — Z8742 Personal history of other diseases of the female genital tract: Secondary | ICD-10-CM | POA: Insufficient documentation

## 2014-09-25 DIAGNOSIS — J45909 Unspecified asthma, uncomplicated: Secondary | ICD-10-CM | POA: Insufficient documentation

## 2014-09-25 DIAGNOSIS — W1830XA Fall on same level, unspecified, initial encounter: Secondary | ICD-10-CM | POA: Diagnosis not present

## 2014-09-25 DIAGNOSIS — Y9389 Activity, other specified: Secondary | ICD-10-CM | POA: Insufficient documentation

## 2014-09-25 DIAGNOSIS — M797 Fibromyalgia: Secondary | ICD-10-CM | POA: Insufficient documentation

## 2014-09-25 DIAGNOSIS — S93501A Unspecified sprain of right great toe, initial encounter: Secondary | ICD-10-CM | POA: Diagnosis not present

## 2014-09-25 DIAGNOSIS — S93529A Sprain of metatarsophalangeal joint of unspecified toe(s), initial encounter: Secondary | ICD-10-CM

## 2014-09-25 DIAGNOSIS — E039 Hypothyroidism, unspecified: Secondary | ICD-10-CM | POA: Insufficient documentation

## 2014-09-25 DIAGNOSIS — G9050 Complex regional pain syndrome I, unspecified: Secondary | ICD-10-CM | POA: Insufficient documentation

## 2014-09-25 DIAGNOSIS — Z79899 Other long term (current) drug therapy: Secondary | ICD-10-CM | POA: Diagnosis not present

## 2014-09-25 DIAGNOSIS — S99921A Unspecified injury of right foot, initial encounter: Secondary | ICD-10-CM | POA: Diagnosis present

## 2014-09-25 DIAGNOSIS — S93409A Sprain of unspecified ligament of unspecified ankle, initial encounter: Secondary | ICD-10-CM

## 2014-09-25 HISTORY — DX: Polyneuropathy, unspecified: G62.9

## 2014-09-25 HISTORY — DX: Angina pectoris with documented spasm: I20.1

## 2014-09-25 HISTORY — DX: Endometriosis, unspecified: N80.9

## 2014-09-25 HISTORY — DX: Chronic fatigue, unspecified: R53.82

## 2014-09-25 HISTORY — DX: Interstitial cystitis (chronic) without hematuria: N30.10

## 2014-09-25 HISTORY — DX: Morbid (severe) obesity due to excess calories: E66.01

## 2014-09-25 HISTORY — DX: Complex regional pain syndrome I, unspecified: G90.50

## 2014-09-25 HISTORY — DX: Myalgic encephalomyelitis/chronic fatigue syndrome: G93.32

## 2014-09-25 MED ORDER — HYDROCODONE-ACETAMINOPHEN 5-325 MG PO TABS: 2.0000 | ORAL_TABLET | ORAL | Status: DC | PRN

## 2014-09-25 MED ORDER — HYDROCODONE-ACETAMINOPHEN 5-325 MG PO TABS
1.0000 | ORAL_TABLET | Freq: Once | ORAL | Status: AC
  Administered 2014-09-25: 1 via ORAL
  Filled 2014-09-25: qty 1

## 2014-09-25 NOTE — ED Provider Notes (Signed)
CSN: [id number]     Arrival date & time 09/25/14  1815 History  This chart was scribed for Tanna Furry, MD by Einar Pheasant, ED Scribe. This patient was seen in room MH06/MH06 and the patient's care was started at 7:35 PM.     Chief Complaint  Patient presents with  . Foot Injury   The history is provided by the patient. No language interpreter was used.   HPI Comments: Crystal [name] is a 53 y.o. female who presents to the Emergency Department complaining of acute onset right foot pain that started yesterday after tripping over a rug in her home. She states that the fall resulting in her over flexing the big toe of her right foot. Pt has a hx of past surgery to the affected foot. She is also complaining of associated swelling. Denies any loss of sensation, ankle pain, or numbness.    Past Medical History  Diagnosis Date  . Diabetes mellitus   . Lumbago   . Interstitial and deep keratitis   . Thyroid disease   . Fibromyalgia   . Asthma   . Seizures   . Hypothyroidism   . Peripheral neuropathy   . Interstitial cystitis   . Morbid obesity   . Endometriosis   . Prinzmetal angina   . Reflex sympathetic dystrophy   . Chronic fatigue syndrome    Past Surgical History  Procedure Laterality Date  . Abdominal surgery    . Tubal ligation    . Abdominal hysterectomy    . Bladder surgery    . Joint replacement    . Cholecystectomy    . Knee surgery     No family history on file. History  Substance Use Topics  . Smoking status: Never Smoker   . Smokeless tobacco: Not on file  . Alcohol Use: No   OB History   Grav Para Term Preterm Abortions TAB SAB Ect Mult Living                 Review of Systems  Constitutional: Negative for fever, chills, diaphoresis and appetite change.  HENT: Negative for mouth sores, sore throat and trouble swallowing.   Eyes: Negative for visual disturbance.  Respiratory: Negative for cough, chest tightness, shortness of breath and wheezing.    Cardiovascular: Negative for chest pain.  Gastrointestinal: Negative for nausea, vomiting, abdominal pain, diarrhea and abdominal distention.  Endocrine: Negative for polydipsia, polyphagia and polyuria.  Genitourinary: Negative for dysuria, frequency and hematuria.  Musculoskeletal: Positive for arthralgias and joint swelling. Negative for gait problem and myalgias.  Skin: Negative for color change, pallor and rash.  Neurological: Negative for dizziness, syncope, weakness, light-headedness, numbness and headaches.  Hematological: Does not bruise/bleed easily.  Psychiatric/Behavioral: Negative for behavioral problems and confusion.      Allergies  Morphine; Nsaids; and Sulfonamide derivatives  Home Medications   Prior to Admission medications   Medication Sig Start Date End Date Taking? Authorizing Provider  ipratropium-albuterol (DUONEB) 0.5-2.5 (3) MG/3ML SOLN Take 3 mLs by nebulization every 4 (four) hours as needed.   Yes Historical Provider, MD  levothyroxine (SYNTHROID, LEVOTHROID) 50 MCG tablet Take 50 mcg by mouth daily.   Yes Historical Provider, MD  lubiprostone (AMITIZA) 24 MCG capsule Take 24 mcg by mouth 2 (two) times daily with a meal.     Yes Historical Provider, MD  metFORMIN (GLUCOPHAGE-XR) 750 MG 24 hr tablet Take 750 mg by mouth daily.     Yes Historical Provider, MD  methadone (DOLOPHINE)  10 MG tablet Take 10 mg by mouth every 6 (six) hours as needed.    Yes Historical Provider, MD  Misc Natural Products (COLON-AID PO) Take 1 tablet by mouth daily as needed. Patient uses this medication for colon cleansing.   Yes Historical Provider, MD  pentosan polysulfate (ELMIRON) 100 MG capsule Take 100 mg by mouth 3 (three) times daily before meals.   Yes Historical Provider, MD  potassium chloride (K-DUR,KLOR-CON) 10 MEQ tablet Take 10 mEq by mouth 2 (two) times daily.   Yes Historical Provider, MD  albuterol (PROVENTIL HFA;VENTOLIN HFA) 108 (90 BASE) MCG/ACT inhaler Inhale 2  puffs into the lungs every 6 (six) hours as needed.    Historical Provider, MD  baclofen (LIORESAL) 10 MG tablet Take 10 mg by mouth 3 (three) times daily.    Historical Provider, MD  Dextrose, Diabetic Use, (GLUCOSE PO) Take 1 tablet by mouth daily as needed.    Historical Provider, MD  diltiazem (CARDIZEM) 60 MG tablet Take 60 mg by mouth 2 (two) times daily.     Historical Provider, MD  EPINEPHrine (EPI-PEN) 0.3 mg/0.3 mL DEVI Inject 0.3 mg into the muscle once.    Historical Provider, MD  erythromycin (ERYTHROCIN) 250 MG tablet Take 400 mg by mouth 3 (three) times daily.    Historical Provider, MD  HYDROcodone-acetaminophen (NORCO/VICODIN) 5-325 MG per tablet Take 2 tablets by mouth every 4 (four) hours as needed. 09/25/14   Tanna Furry, MD  isometheptene-acetaminophen-dichloralphenazone (MIDRIN) [phone] MG capsule Take 1 capsule by mouth 4 (four) times daily as needed.    Historical Provider, MD  lidocaine (LIDODERM) 5 % Place 1 patch onto the skin daily. Remove & Discard patch within 12 hours or as directed by MD    Historical Provider, MD  lidocaine (XYLOCAINE) 5 % ointment Apply 1 application topically as needed. Patient uses this medication for her feet.    Historical Provider, MD  midodrine (PROAMATINE) 5 MG tablet Take 10 mg by mouth 3 (three) times daily.     Historical Provider, MD  nitroGLYCERIN (NITROSTAT) 0.4 MG SL tablet Place 0.4 mg under the tongue every 5 (five) minutes as needed.    Historical Provider, MD  pregabalin (LYRICA) 100 MG capsule Take by mouth. Unknown dose    Historical Provider, MD   Triage vitals: BP 145/76  Pulse 81  Temp(Src) 98.3 F (36.8 C) (Oral)  Resp 16  Ht 5\' 8"  (1.727 m)  Wt 297 lb (134.718 kg)  BMI 45.17 kg/m2  SpO2 98%  Physical Exam  Constitutional: She is oriented to person, place, and time. She appears well-developed and well-nourished. No distress.  HENT:  Head: Normocephalic.  Eyes: Conjunctivae are normal. Pupils are equal, round, and  reactive to light. No scleral icterus.  Neck: Normal range of motion. Neck supple. No thyromegaly present.  Cardiovascular: Normal rate and regular rhythm.  Exam reveals no gallop and no friction rub.   No murmur heard. Pulmonary/Chest: Effort normal and breath sounds normal. No respiratory distress. She has no wheezes. She has no rales.  Abdominal: Soft. Bowel sounds are normal. She exhibits no distension. There is no tenderness. There is no rebound.  Musculoskeletal: Normal range of motion.  Healed incision of the MTP joint. Great toe and medial side tenderness. Pain is localized to proximal metatarsals. Slight tenderness over the TMs ligament. No pain proximally over the fibula. No soft tissue swelling noted.  Neurological: She is alert and oriented to person, place, and time.  Skin: Skin is  warm and dry. No rash noted.  Psychiatric: She has a normal mood and affect. Her behavior is normal.    ED Course  Procedures (including critical care time)  DIAGNOSTIC STUDIES: Oxygen Saturation is 98% on RA, normal by my interpretation.    COORDINATION OF CARE: 7:39 PM- Will give pt a referral to on call ortho. Pt advised of plan for treatment and pt agrees.   Imaging Review Dg Ankle Complete Right  09/25/2014   CLINICAL DATA:  53 year old female status post fall. Pain with external rotation.  EXAM: RIGHT ANKLE - COMPLETE 3+ VIEW  COMPARISON:  None.  FINDINGS: No displaced fracture identified. Mild soft tissue swelling is present at the foot.  Irregular appearance at the distal lateral and medial malleoli is favored to be a chronic changes, potentially from remote injury or degenerative.  Osteopenia.  IMPRESSION: No acute bony abnormality is identified.  Changes at the distal medial and lateral malleolus, as well as at the midfoot may represent degenerative changes or remote trauma.  Signed,  Dulcy Fanny. Earleen Newport, DO  [facility]  [facility]    Electronically Signed   By: Corrie Mckusick D.O.   On: 09/25/2014 19:37   Dg Foot Complete Right  09/25/2014   CLINICAL DATA:  53 year old female with a history of fall. Multiple comorbidities.  EXAM: RIGHT FOOT COMPLETE - 3+ VIEW  COMPARISON:  None.  FINDINGS: No acute displaced fracture. Diffuse osteopenia. 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  Mild soft tissue swelling is present.  No radiopaque foreign body.  Lateral view demonstrates enthesophyte formation at the plantar fascia insertion and at the Achilles insertion.  IMPRESSION: No acute bony abnormality identified.  Signed,  Dulcy Fanny. Earleen Newport, DO  [facility]  [facility]   Electronically Signed   By: Corrie Mckusick D.O.   On: 09/25/2014 19:34     MDM   Final diagnoses:  Ankle sprain, unspecified laterality, initial encounter  Turf toe, initial encounter  Toe sprain, initial encounter      I personally performed the services described in this documentation, which was scribed in my presence. The recorded information has been reviewed and is accurate.    Tanna Furry, MD 09/25/14 2000

## 2014-09-25 NOTE — Discharge Instructions (Signed)
Ankle Sprain °An ankle sprain is an injury to the strong, fibrous tissues (ligaments) that hold the bones of your ankle joint together.  °CAUSES °An ankle sprain is usually caused by a fall or by twisting your ankle. Ankle sprains most commonly occur when you step on the outer edge of your foot, and your ankle turns inward. People who participate in sports are more prone to these types of injuries.  °SYMPTOMS  °· Pain in your ankle. The pain may be present at rest or only when you are trying to stand or walk. °· Swelling. °· Bruising. Bruising may develop immediately or within 1 to 2 days after your injury. °· Difficulty standing or walking, particularly when turning corners or changing directions. °DIAGNOSIS  °Your caregiver will ask you details about your injury and perform a physical exam of your ankle to determine if you have an ankle sprain. During the physical exam, your caregiver will press on and apply pressure to specific areas of your foot and ankle. Your caregiver will try to move your ankle in certain ways. An X-ray exam may be done to be sure a bone was not broken or a ligament did not separate from one of the bones in your ankle (avulsion fracture).  °TREATMENT  °Certain types of braces can help stabilize your ankle. Your caregiver can make a recommendation for this. Your caregiver may recommend the use of medicine for pain. If your sprain is severe, your caregiver may refer you to a surgeon who helps to restore function to parts of your skeletal system (orthopedist) or a physical therapist. °HOME CARE INSTRUCTIONS  °· Apply ice to your injury for 1-2 days or as directed by your caregiver. Applying ice helps to reduce inflammation and pain. °¨ Put ice in a plastic bag. °¨ Place a towel between your skin and the bag. °¨ Leave the ice on for 15-20 minutes at a time, every 2 hours while you are awake. °· Only take over-the-counter or prescription medicines for pain, discomfort, or fever as directed by  your caregiver. °· Elevate your injured ankle above the level of your heart as much as possible for 2-3 days. °· If your caregiver recommends crutches, use them as instructed. Gradually put weight on the affected ankle. Continue to use crutches or a cane until you can walk without feeling pain in your ankle. °· If you have a plaster splint, wear the splint as directed by your caregiver. Do not rest it on anything harder than a pillow for the first 24 hours. Do not put weight on it. Do not get it wet. You may take it off to take a shower or bath. °· You may have been given an elastic bandage to wear around your ankle to provide support. If the elastic bandage is too tight (you have numbness or tingling in your foot or your foot becomes cold and blue), adjust the bandage to make it comfortable. °· If you have an air splint, you may blow more air into it or let air out to make it more comfortable. You may take your splint off at night and before taking a shower or bath. Wiggle your toes in the splint several times per day to decrease swelling. °SEEK MEDICAL CARE IF:  °· You have rapidly increasing bruising or swelling. °· Your toes feel extremely cold or you lose feeling in your foot. °· Your pain is not relieved with medicine. °SEEK IMMEDIATE MEDICAL CARE IF: °· Your toes are numb or blue. °·   You have severe pain that is increasing. MAKE SURE YOU:   Understand these instructions.  Will watch your condition.  Will get help right away if you are not doing well or get worse. Document Released: 11/29/2005 Document Revised: 08/23/2012 Document Reviewed: 12/11/2011 [facility] Patient Information 2015 [place], [place]. This information is not intended to replace advice given to you by your health care provider. Make sure you discuss any questions you have with your health care provider.  Turf Toe Turf toe is a condition of pain at the base of the big toe, located at the ball of the foot. The condition is usually  caused from either jamming or extending the toe beyond normal limits (hyperextension). This is the result of pushing off repeatedly when running or jumping. The main problem is pain at the base of the toe, but there may also be stiffness and swelling. The name turf toe comes from the fact that this injury is especially common among athletes who play on hard surfaces, such as artificial turf and basketball courts. Hard surfaces combined with running and jumping makes this a common sports injury. DIAGNOSIS  The diagnosis of turftoeisnotdifficult. It is made by examination. X-rays may be taken to make sure there is nobreak in the bone (fracture). Not doing surgery (conservative treatment) solves the problem most of the time. Conservative treatment includes the following home care instructions. HOME CARE INSTRUCTIONS   Apply ice to the sore area for 15-20 minutes, 03-04 times per day while awake, for the first 4 days. Put the ice in a plastic bag and place a towel between the bag of ice and your skin. Use ice if possible following any activities, even after the first four days.  Keep your leg elevated when possible to lessen swelling and discomfort in the toe.  Use crutches with non-weight bearing on the affected foot for ten days, or as needed for pain. Then you may walk as the pain allows, or as instructed. Start gradually with weight bearing on the affected foot. Shoes with stiff soles will generally be helpful in limiting pain for the first 1 to 2 weeks.  Continue to use crutches or a cane until you can stand on your foot without causing pain.  Only take over-the-counter or prescription medicines for pain, discomfort, or fever as directed by your caregiver. SEEK IMMEDIATE MEDICAL CARE IF:   You have an increase in bruising, swelling, or pain in your toe.  Pain relief is not obtained with medications. Turf toe can return, and problems may be slow to improve. This is more common if you return to  athletic activities too soon and do not allow the problem to fully recover. Surgery is rarely needed, but in certain cases it may be necessary. If a bone spur forms and severely limits motion of the toe joint, surgery to remove the spur and improve motion of the big toe may be helpful. Document Released: 05/21/2002 Document Revised: 02/21/2012 Document Reviewed: 05/06/2009 [facility] Patient Information 2015 [place], [place]. This information is not intended to replace advice given to you by your health care provider. Make sure you discuss any questions you have with your health care provider.

## 2014-09-25 NOTE — ED Notes (Signed)
Pt tripped over a throw rug yesterday, injuring right foot and ankle.  Sts she is not able to bear weight on it now.  Is in [facility] most of time.

## 2015-01-01 ENCOUNTER — Other Ambulatory Visit (HOSPITAL_COMMUNITY): Payer: Self-pay | Admitting: Obstetrics and Gynecology

## 2015-01-01 DIAGNOSIS — Z1231 Encounter for screening mammogram for malignant neoplasm of breast: Secondary | ICD-10-CM

## 2015-01-10 ENCOUNTER — Ambulatory Visit (HOSPITAL_COMMUNITY)
Admission: RE | Admit: 2015-01-10 | Discharge: 2015-01-10 | Disposition: A | Discharge status: Home / Self Care | Payer: 59 | Source: Ambulatory Visit | Attending: Obstetrics and Gynecology | Admitting: Obstetrics and Gynecology

## 2015-01-10 DIAGNOSIS — Z1231 Encounter for screening mammogram for malignant neoplasm of breast: Secondary | ICD-10-CM | POA: Diagnosis not present

## 2015-09-23 ENCOUNTER — Ambulatory Visit (HOSPITAL_BASED_OUTPATIENT_CLINIC_OR_DEPARTMENT_OTHER): Payer: Medicare Other | Attending: Internal Medicine | Admitting: Radiology

## 2015-09-23 VITALS — Ht 68.0 in | Wt 306.0 lb

## 2015-09-23 DIAGNOSIS — E119 Type 2 diabetes mellitus without complications: Secondary | ICD-10-CM | POA: Insufficient documentation

## 2015-09-23 DIAGNOSIS — Z87898 Personal history of other specified conditions: Secondary | ICD-10-CM

## 2015-09-23 DIAGNOSIS — R0683 Snoring: Secondary | ICD-10-CM

## 2015-09-23 DIAGNOSIS — R5383 Other fatigue: Secondary | ICD-10-CM | POA: Insufficient documentation

## 2015-09-23 DIAGNOSIS — I493 Ventricular premature depolarization: Secondary | ICD-10-CM | POA: Insufficient documentation

## 2015-09-23 DIAGNOSIS — E669 Obesity, unspecified: Secondary | ICD-10-CM | POA: Insufficient documentation

## 2015-09-23 DIAGNOSIS — G4719 Other hypersomnia: Secondary | ICD-10-CM | POA: Diagnosis present

## 2015-09-23 DIAGNOSIS — Z6841 Body Mass Index (BMI) 40.0 and over, adult: Secondary | ICD-10-CM | POA: Insufficient documentation

## 2015-10-04 ENCOUNTER — Encounter (HOSPITAL_BASED_OUTPATIENT_CLINIC_OR_DEPARTMENT_OTHER): Payer: Medicare Other | Admitting: Internal Medicine

## 2015-10-04 DIAGNOSIS — Z87898 Personal history of other specified conditions: Secondary | ICD-10-CM

## 2015-10-04 DIAGNOSIS — R0683 Snoring: Secondary | ICD-10-CM | POA: Diagnosis not present

## 2015-10-04 NOTE — Progress Notes (Signed)
  Patient Name: Crystal [name], Crystal [name] Date: 09/23/2015 Gender: Female D.O.B: Apr 13, 1961 Age (years): 53 Referring Provider: Kevan Ny Height (inches): 16 Interpreting Physician: Baird Lyons MD, ABSM Weight (lbs): 306 RPSGT: Carolin Coy BMI: 62 MRN: [id number] Neck Size: 16.00 CLINICAL INFORMATION Sleep Study Type: NPSG Indication for sleep study: Diabetes, Excessive Daytime Sleepiness, Fatigue, Obesity, Snoring Epworth Sleepiness Score: 14 SLEEP STUDY TECHNIQUE As per the AASM Manual for the Scoring of Sleep and Associated Events v2.3 (April 2016) with a hypopnea requiring 4% desaturations. The channels recorded and monitored were frontal, central and occipital EEG, electrooculogram (EOG), submentalis EMG (chin), nasal and oral airflow, thoracic and abdominal wall motion, anterior tibialis EMG, snore microphone, electrocardiogram, and pulse oximetry. MEDICATIONS Patient's medications include: charted for review. Medications self-administered by patient during sleep study : No sleep medicine administered.  SLEEP ARCHITECTURE The study was initiated at 11:17:36 PM and ended at 5:10:00 AM. Sleep onset time was 5.5 minutes and the sleep efficiency was 82.7%. The total sleep time was 291.5 minutes. Stage REM latency was 87.0 minutes. The patient spent 5.15% of the night in stage N1 sleep, 72.21% in stage N2 sleep, 3.09% in stage N3 and 19.55% in REM. Alpha intrusion was absent. Supine sleep was 14.75%.  RESPIRATORY PARAMETERS The overall apnea/hypopnea index (AHI) was 4.5 per hour. There were 6 total apneas, including 4 obstructive, 1 central and 1 mixed apneas. There were 16 hypopneas and 8 RERAs. The AHI during Stage REM sleep was 17.9 per hour. AHI while supine was 1.4 per hour. The mean oxygen saturation was 94.73%. The minimum SpO2 during sleep was 85.00%. Moderate snoring was noted during this study.  CARDIAC DATA The 2 lead EKG demonstrated sinus rhythm. The mean heart  rate was 62.54 beats per minute. Other EKG findings include: PVCs.  LEG MOVEMENT DATA The total PLMS were 0 with a resulting PLMS index of 0.00. Associated arousal with leg movement index was 0.0 . IMPRESSIONS - No significant obstructive sleep apnea occurred during this study (AHI = 4.5/h). - No significant central sleep apnea occurred during this study (CAI = 0.2/h). - Mild oxygen desaturation was noted during this study (Min O2 = 85.00%). - The patient snored with Moderate snoring volume. - EKG findings include PVCs. - Clinically significant periodic limb movements did not occur during sleep. No significant associated arousals.  DIAGNOSIS - Primary Snoring (786.09 [R06.83 ICD-10]) - Normal study  RECOMMENDATIONS - Avoid alcohol, sedatives and other CNS depressants that may worsen sleep apnea and disrupt normal sleep architecture. - Sleep hygiene should be reviewed to assess factors that may improve sleep quality. - Weight management and regular exercise should be initiated or continued if appropriate.  Deneise Lever Diplomate, American Board of Sleep Medicine  ELECTRONICALLY SIGNED ON:  10/04/2015, 3:26 PM [place] PH: [phone]   FX: [phone] [place]

## 2016-12-11 ENCOUNTER — Encounter (HOSPITAL_BASED_OUTPATIENT_CLINIC_OR_DEPARTMENT_OTHER): Payer: Self-pay | Admitting: *Deleted

## 2016-12-11 ENCOUNTER — Emergency Department (HOSPITAL_BASED_OUTPATIENT_CLINIC_OR_DEPARTMENT_OTHER)
Admission: EM | Admit: 2016-12-11 | Discharge: 2016-12-11 | Disposition: A | Discharge status: Home / Self Care | Payer: Medicare Other | Attending: Emergency Medicine | Admitting: Emergency Medicine

## 2016-12-11 DIAGNOSIS — M545 Low back pain: Secondary | ICD-10-CM | POA: Diagnosis present

## 2016-12-11 DIAGNOSIS — Z79899 Other long term (current) drug therapy: Secondary | ICD-10-CM | POA: Diagnosis not present

## 2016-12-11 DIAGNOSIS — E119 Type 2 diabetes mellitus without complications: Secondary | ICD-10-CM | POA: Diagnosis not present

## 2016-12-11 DIAGNOSIS — M5416 Radiculopathy, lumbar region: Secondary | ICD-10-CM | POA: Insufficient documentation

## 2016-12-11 DIAGNOSIS — Z7984 Long term (current) use of oral hypoglycemic drugs: Secondary | ICD-10-CM | POA: Diagnosis not present

## 2016-12-11 DIAGNOSIS — J45909 Unspecified asthma, uncomplicated: Secondary | ICD-10-CM | POA: Insufficient documentation

## 2016-12-11 DIAGNOSIS — E039 Hypothyroidism, unspecified: Secondary | ICD-10-CM | POA: Diagnosis not present

## 2016-12-11 MED ORDER — OXYCODONE-ACETAMINOPHEN 5-325 MG PO TABS: 2.0000 | ORAL_TABLET | ORAL | 0 refills | Status: DC | PRN

## 2016-12-11 MED ORDER — PREDNISONE 50 MG PO TABS
60.0000 mg | ORAL_TABLET | Freq: Once | ORAL | Status: AC
  Administered 2016-12-11: 60 mg via ORAL
  Filled 2016-12-11: qty 1

## 2016-12-11 MED ORDER — OXYCODONE-ACETAMINOPHEN 5-325 MG PO TABS
2.0000 | ORAL_TABLET | Freq: Once | ORAL | Status: AC
  Administered 2016-12-11: 2 via ORAL
  Filled 2016-12-11: qty 2

## 2016-12-11 MED ORDER — METHYLPREDNISOLONE 4 MG PO TBPK: ORAL_TABLET | ORAL | 0 refills | Status: DC

## 2016-12-11 NOTE — Discharge Instructions (Signed)
Follow-up with your physician if not improving or with any worsening symptoms.  Avoid upright or sitting position as much as possible. Lay down, flexing her hips and knees slightly to take pressure off of your low back.

## 2016-12-11 NOTE — ED Triage Notes (Signed)
Patient c/o lower back pain that shoots down her right leg that has been hurting since christmas day

## 2016-12-11 NOTE — ED Provider Notes (Signed)
[facility] DEPT [facility] Provider Note   CSN: [id number] Arrival date & time: 12/11/16  1004     History   Chief Complaint Chief Complaint  Patient presents with  . Back Pain    HPI Crystal [name] is a 55 y.o. female.Chief complaint back pain  HPI:  Patient complains of back pain. States is been present for 5 days. Starts in her back. Radiates down her right leg "shoots". Straight morbid obesity. His often in a wheelchair or cart for mobility.  Has a distant history of MRI showing L5-S1 "degenerative disc disease". She and her husband just moved residences. She been doing a lot of unpacking and has been much more active than usual. She also confirmed her family WAS. She has some intermittent right leg pain prior to Christmas day. Since at times had almost constant pain from her right low back to her right hip laterally to her leg and inferiorly to the lateral foot. No foot drop or leg weakness. No rash or vesicles. No change in bowel or bladder habits. Has had normal Pap bowel movements. Urinating normally. No incontinence.  Past Medical History:  Diagnosis Date  . Asthma   . Chronic fatigue syndrome   . Diabetes mellitus   . Endometriosis   . Fibromyalgia   . Hypothyroidism   . Interstitial and deep keratitis   . Interstitial cystitis   . Lumbago   . Morbid obesity ([place])   . Peripheral neuropathy ([place])   . 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 Reflex sympathetic dystrophy   . Seizures ([place])   . Thyroid disease     Patient Active Problem List   Diagnosis Date Noted  . DM (diabetes mellitus) ([place]) 11/27/2012  . IBS (irritable bowel syndrome) 11/27/2012  . Dysesthesia of lower extremities 11/27/2012  . Fibromyalgia 11/27/2012  . Hypothyroidism 11/27/2012  . ALLERGY, FOOD 12/13/2007  . MORBID OBESITY 11/02/2007  . ANXIETY 11/02/2007  . VOCAL CORD DISORDER 11/02/2007  . ASTHMA 11/02/2007  . ESOPHAGEAL REFLUX 11/02/2007  . CHEST PAIN, ATYPICAL 11/02/2007    Past Surgical  History:  Procedure Laterality Date  . ABDOMINAL HYSTERECTOMY    . ABDOMINAL SURGERY    . BLADDER SURGERY    . CHOLECYSTECTOMY    . JOINT REPLACEMENT    . KNEE SURGERY    . TUBAL LIGATION      OB History    No data available       Home Medications    Prior to Admission medications   Medication Sig Start Date End Date Taking? Authorizing Provider  albuterol (PROVENTIL HFA;VENTOLIN HFA) 108 (90 BASE) MCG/ACT inhaler Inhale 2 puffs into the lungs every 6 (six) hours as needed.    Historical Provider, MD  baclofen (LIORESAL) 10 MG tablet Take 10 mg by mouth 3 (three) times daily.    Historical Provider, MD  Dextrose, Diabetic Use, (GLUCOSE PO) Take 1 tablet by mouth daily as needed.    Historical Provider, MD  diltiazem (CARDIZEM) 60 MG tablet Take 60 mg by mouth 2 (two) times daily.     Historical Provider, MD  EPINEPHrine (EPI-PEN) 0.3 mg/0.3 mL DEVI Inject 0.3 mg into the muscle once.    Historical Provider, MD  erythromycin (ERYTHROCIN) 250 MG tablet Take 400 mg by mouth 3 (three) times daily.    Historical Provider, MD  HYDROcodone-acetaminophen (NORCO/VICODIN) 5-325 MG per tablet Take 2 tablets by mouth every 4 (four) hours as needed. 09/25/14   Tanna Furry, MD  ipratropium-albuterol (DUONEB) 0.5-2.5 (  3) MG/3ML SOLN Take 3 mLs by nebulization every 4 (four) hours as needed.    Historical Provider, MD  isometheptene-acetaminophen-dichloralphenazone (MIDRIN) 65-325-100 MG capsule Take 1 capsule by mouth 4 (four) times daily as needed.    Historical Provider, MD  levothyroxine (SYNTHROID, LEVOTHROID) 50 MCG tablet Take 50 mcg by mouth daily.    Historical Provider, MD  lidocaine (LIDODERM) 5 % Place 1 patch onto the skin daily. Remove & Discard patch within 12 hours or as directed by MD    Historical Provider, MD  lidocaine (XYLOCAINE) 5 % ointment Apply 1 application topically as needed. Patient uses this medication for her feet.    Historical Provider, MD  lubiprostone (AMITIZA) 24  MCG capsule Take 24 mcg by mouth 2 (two) times daily with a meal.      Historical Provider, MD  metFORMIN (GLUCOPHAGE-XR) 750 MG 24 hr tablet Take 750 mg by mouth daily.      Historical Provider, MD  methadone (DOLOPHINE) 10 MG tablet Take 10 mg by mouth every 6 (six) hours as needed.     Historical Provider, MD  methylPREDNISolone (MEDROL DOSEPAK) 4 MG TBPK tablet 6 po on day 1, decrease by 1 tab per day 12/11/16   Tanna Furry, MD  midodrine (PROAMATINE) 5 MG tablet Take 10 mg by mouth 3 (three) times daily.     Historical Provider, MD  Misc Natural Products (COLON-AID PO) Take 1 tablet by mouth daily as needed. Patient uses this medication for colon cleansing.    Historical Provider, MD  nitroGLYCERIN (NITROSTAT) 0.4 MG SL tablet Place 0.4 mg under the tongue every 5 (five) minutes as needed.    Historical Provider, MD  oxyCODONE-acetaminophen (PERCOCET/ROXICET) 5-325 MG tablet Take 2 tablets by mouth every 4 (four) hours as needed. 12/11/16   Tanna Furry, MD  pentosan polysulfate (ELMIRON) 100 MG capsule Take 100 mg by mouth 3 (three) times daily before meals.    Historical Provider, MD  potassium chloride (K-DUR,KLOR-CON) 10 MEQ tablet Take 10 mEq by mouth 2 (two) times daily.    Historical Provider, MD  pregabalin (LYRICA) 100 MG capsule Take by mouth. Unknown dose    Historical Provider, MD    Family History No family history on file.  Social History Social History  Substance Use Topics  . Smoking status: Never Smoker  . Smokeless tobacco: Not on file  . Alcohol use No     Allergies   Morphine; Nsaids; and Sulfonamide derivatives   Review of Systems Review of Systems  Constitutional: Negative for appetite change, chills, diaphoresis, fatigue and fever.  HENT: Negative for mouth sores, sore throat and trouble swallowing.   Eyes: Negative for visual disturbance.  Respiratory: Negative for cough, chest tightness, shortness of breath and wheezing.   Cardiovascular: Negative for  chest pain.  Gastrointestinal: Negative for abdominal distention, abdominal pain, diarrhea, nausea and vomiting.  Endocrine: Negative for polydipsia, polyphagia and polyuria.  Genitourinary: Negative for dysuria, frequency and hematuria.  Musculoskeletal: Positive for back pain. Negative for gait problem.       Right leg pain  Skin: Negative for color change, pallor and rash.  Neurological: Negative for dizziness, syncope, light-headedness and headaches.  Hematological: Does not bruise/bleed easily.  Psychiatric/Behavioral: Negative for behavioral problems and confusion.     Physical Exam Updated Vital Signs BP 132/78 (BP Location: Right Arm)   Pulse 67   Temp 98.1 F (36.7 C)   Resp 18   SpO2 100%   Physical Exam  Constitutional:  She is oriented to person, place, and time. She appears well-developed and well-nourished. No distress.  HENT:  Head: Normocephalic.  Eyes: Conjunctivae are normal. Pupils are equal, round, and reactive to light. No scleral icterus.  Neck: Normal range of motion. Neck supple. No thyromegaly present.  Cardiovascular: Normal rate and regular rhythm.  Exam reveals no gallop and no friction rub.   No murmur heard. Pulmonary/Chest: Effort normal and breath sounds normal. No respiratory distress. She has no wheezes. She has no rales.  Abdominal: Soft. Bowel sounds are normal. She exhibits no distension. There is no tenderness. There is no rebound.  Musculoskeletal: Normal range of motion.  Neurological: She is alert and oriented to person, place, and time.   Norma symmetric strength to flex/.extend hip and knees, dorsi/plantar flex ankles. Normal symmetric sensation to all distributions to LEs Patellar and achilles reflexes 1-2+. Downgoing Babinski   Skin: Skin is warm and dry. No rash noted.  Psychiatric: She has a normal mood and affect. Her behavior is normal.     ED Treatments / Results  Labs (all labs ordered are listed, but only abnormal  results are displayed) Labs Reviewed - No data to display  EKG  EKG Interpretation None       Radiology No results found.  Procedures Procedures (including critical care time)  Medications Ordered in ED Medications  oxyCODONE-acetaminophen (PERCOCET/ROXICET) 5-325 MG per tablet 2 tablet (2 tablets Oral Given 12/11/16 1042)  predniSONE (DELTASONE) tablet 60 mg (60 mg Oral Given 12/11/16 1042)     Initial Impression / Assessment and Plan / ED Course  I have reviewed the triage vital signs and the nursing notes.  Pertinent labs & imaging results that were available during my care of the patient were reviewed by me and considered in my medical decision making (see chart for details).  Clinical Course     Symptoms and findings consistent with an L5-S1 radicular pain. No sign of functional neurological loss or weakness. No reported bowel or bladder symptoms. Plan steroids, Medrol taper. Primary care follow-up. Return precautions reviewed with patient.  Final Clinical Impressions(s) / ED Diagnoses   Final diagnoses:  Lumbar radiculopathy    New Prescriptions New Prescriptions   METHYLPREDNISOLONE (MEDROL DOSEPAK) 4 MG TBPK TABLET    6 po on day 1, decrease by 1 tab per day   OXYCODONE-ACETAMINOPHEN (PERCOCET/ROXICET) 5-325 MG TABLET    Take 2 tablets by mouth every 4 (four) hours as needed.     Tanna Furry, MD 12/11/16 1045

## 2016-12-13 DIAGNOSIS — M47816 Spondylosis without myelopathy or radiculopathy, lumbar region: Secondary | ICD-10-CM

## 2016-12-13 HISTORY — DX: Spondylosis without myelopathy or radiculopathy, lumbar region: M47.816

## 2016-12-17 ENCOUNTER — Other Ambulatory Visit: Payer: Self-pay | Admitting: Sports Medicine

## 2016-12-17 DIAGNOSIS — M545 Low back pain: Secondary | ICD-10-CM

## 2016-12-24 ENCOUNTER — Ambulatory Visit
Admission: RE | Admit: 2016-12-24 | Discharge: 2016-12-24 | Disposition: A | Discharge status: Home / Self Care | Payer: Medicare Other | Source: Ambulatory Visit | Attending: Sports Medicine | Admitting: Sports Medicine

## 2016-12-24 ENCOUNTER — Other Ambulatory Visit: Payer: Medicare Other

## 2016-12-24 DIAGNOSIS — M545 Low back pain: Secondary | ICD-10-CM

## 2017-01-31 ENCOUNTER — Other Ambulatory Visit: Payer: Self-pay | Admitting: Neurological Surgery

## 2017-01-31 DIAGNOSIS — M545 Low back pain: Secondary | ICD-10-CM

## 2017-02-01 ENCOUNTER — Other Ambulatory Visit: Payer: Medicare Other

## 2017-02-03 ENCOUNTER — Ambulatory Visit
Admission: RE | Admit: 2017-02-03 | Discharge: 2017-02-03 | Disposition: A | Discharge status: Home / Self Care | Payer: Medicare Other | Source: Ambulatory Visit | Attending: Neurological Surgery | Admitting: Neurological Surgery

## 2017-02-03 DIAGNOSIS — M545 Low back pain: Secondary | ICD-10-CM

## 2017-10-14 ENCOUNTER — Encounter: Payer: Self-pay | Admitting: *Deleted

## 2017-10-17 ENCOUNTER — Telehealth: Payer: Self-pay | Admitting: *Deleted

## 2017-10-17 ENCOUNTER — Ambulatory Visit (INDEPENDENT_AMBULATORY_CARE_PROVIDER_SITE_OTHER): Payer: Medicare Other | Admitting: Neurology

## 2017-10-17 ENCOUNTER — Encounter: Payer: Self-pay | Admitting: Neurology

## 2017-10-17 DIAGNOSIS — G5711 Meralgia paresthetica, right lower limb: Secondary | ICD-10-CM | POA: Diagnosis not present

## 2017-10-17 MED ORDER — CARBAMAZEPINE 200 MG PO TABS: ORAL_TABLET | ORAL | 3 refills | Status: DC

## 2017-10-17 MED ORDER — LEVETIRACETAM 500 MG PO TABS: 500.0000 mg | ORAL_TABLET | Freq: Two times a day (BID) | ORAL | 3 refills | Status: DC

## 2017-10-17 NOTE — Progress Notes (Signed)
Reason for visit: Back and leg pain  Referring physician: Dr. Thomasene Lot is a 56 y.o. female  History of present illness:  Ms. Pung is a 56 year old right-handed black female with a history of diabetes and obesity.  The patient indicates that she began having discomfort in the feet relatively suddenly in 2009, she was seen by Dr. Krista Blue and EMG and nerve conduction studies were apparently done but did not show a definite etiology of her symptoms.  The patient has been tried on gabapentin and Lyrica and Cymbalta without much benefit.  She has a multitude of other issues including irritable bowel syndrome, interstitial cystitis, and fibromyalgia.  She reports some problems with urinary and fecal incontinence that is chronic in nature.  She has developed chronic low back pain that is worse with standing, improved with sitting.  In December 2017, the patient began having severe pain down the right leg in the anterolateral aspect of the thigh with tingling and sharp shooting pains.  Occasionally she claims that the discomfort would go down below the knee to the foot.  The patient mainly has discomfort in the thigh, however without any associated weakness.  The patient reports some mild gait instability, she has not had any falls.  She does not believe that either leg is weak.  She has ongoing burning sensations in both feet that have been persistent since 2009.  She denies any neck pain or pain down the arms or numbness or weakness of the arms.  MRI evaluation of the lumbar spine shows multilevel facet joint arthritis that is worse at the L4-5 level, but no evidence of a significant nerve root impingement and no evidence of spinal stenosis at any level.  The patient is sent to this office for further evaluation.  The patient has gained over 70 pounds since December 2017.  Past Medical History:  Diagnosis Date  . Asthma   . Chronic fatigue syndrome   . Diabetes mellitus   . Endometriosis     . Fibromyalgia   . Hypothyroidism   . Interstitial and deep keratitis   . Interstitial cystitis   . Lumbago   . Morbid obesity ([place])   . Peripheral neuropathy   . 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 Reflex sympathetic dystrophy   . Seizures ([place])   . Thyroid disease     Past Surgical History:  Procedure Laterality Date  . ABDOMINAL HYSTERECTOMY    . ABDOMINAL SURGERY    . BLADDER SURGERY    . CHOLECYSTECTOMY    . JOINT REPLACEMENT    . KNEE SURGERY    . TUBAL LIGATION      No family history on file.  Social history:  reports that  has never smoked. She does not have any smokeless tobacco history on file. She reports that she does not drink alcohol. Her drug history is not on file.  Medications:  Prior to Admission medications   Medication Sig Start Date End Date Taking? Authorizing Provider  metFORMIN (GLUCOPHAGE) 500 MG tablet Take 500 mg 2 (two) times daily with a meal by mouth.   Yes [provider]  potassium chloride (K-DUR,KLOR-CON) 10 MEQ tablet Take 10 mEq by mouth 2 (two) times daily.   Yes [provider]  albuterol (PROVENTIL HFA;VENTOLIN HFA) 108 (90 BASE) MCG/ACT inhaler Inhale 2 puffs into the lungs every 6 (six) hours as needed.    [provider]  baclofen (LIORESAL) 10 MG tablet Take 10 mg  by mouth 3 (three) times daily.    [provider]  Dextrose, Diabetic Use, (GLUCOSE PO) Take 1 tablet by mouth daily as needed.    [provider]  diltiazem (CARDIZEM) 60 MG tablet Take 60 mg by mouth 2 (two) times daily.     [provider]  EPINEPHrine (EPI-PEN) 0.3 mg/0.3 mL DEVI Inject 0.3 mg into the muscle once.    [provider]  erythromycin (ERYTHROCIN) 250 MG tablet Take 400 mg by mouth 3 (three) times daily.    [provider]  estradiol (ESTRACE VAGINAL) 0.1 MG/GM vaginal cream Place 1 Applicatorful vaginally at bedtime.    [provider]  fexofenadine-pseudoephedrine (ALLEGRA-D 24)  180-240 MG 24 hr tablet Take 1 tablet by mouth daily.    [provider]  HYDROcodone-acetaminophen (NORCO/VICODIN) 5-325 MG per tablet Take 2 tablets by mouth every 4 (four) hours as needed. 09/25/14   Tanna Furry, MD  ipratropium-albuterol (DUONEB) 0.5-2.5 (3) MG/3ML SOLN Take 3 mLs by nebulization every 4 (four) hours as needed.    [provider]  isometheptene-acetaminophen-dichloralphenazone (MIDRIN) 65-325-100 MG capsule Take 1 capsule by mouth 4 (four) times daily as needed.    [provider]  levothyroxine (SYNTHROID, LEVOTHROID) 50 MCG tablet Take 50 mcg by mouth daily.    [provider]  lidocaine (LIDODERM) 5 % Place 1 patch onto the skin daily. Remove & Discard patch within 12 hours or as directed by MD    [provider]  lidocaine (XYLOCAINE) 5 % ointment Apply 1 application topically as needed. Patient uses this medication for her feet.    [provider]  lubiprostone (AMITIZA) 24 MCG capsule Take 24 mcg by mouth 2 (two) times daily with a meal.      [provider]  methadone (DOLOPHINE) 10 MG tablet Take 10 mg by mouth every 6 (six) hours as needed.     [provider]  methylPREDNISolone (MEDROL DOSEPAK) 4 MG TBPK tablet 6 po on day 1, decrease by 1 tab per day 12/11/16   Tanna Furry, MD  midodrine (PROAMATINE) 5 MG tablet Take 10 mg by mouth 3 (three) times daily.     [provider]  Misc Natural Products (COLON-AID PO) Take 1 tablet by mouth daily as needed. Patient uses this medication for colon cleansing.    [provider]  nitroGLYCERIN (NITROSTAT) 0.4 MG SL tablet Place 0.4 mg under the tongue every 5 (five) minutes as needed.    [provider]  oxyCODONE-acetaminophen (PERCOCET/ROXICET) 5-325 MG tablet Take 2 tablets by mouth every 4 (four) hours as needed. 12/11/16   Tanna Furry, MD  pentosan polysulfate (ELMIRON) 100 MG capsule Take 100 mg by mouth 3 (three) times daily  before meals.    [provider]  pregabalin (LYRICA) 100 MG capsule Take by mouth. Unknown dose    [provider]      Allergies  Allergen Reactions  . Flagyl [Metronidazole]   . Morphine     REACTION: nausea, projectile vomiting  . Nsaids   . Other     Metals: Nickel, cat dander, dog dander, hickery, ash, walnut, lambs quarter, dust, wagweed, dustmites, difar, sawdust, tar  . Sulfonamide Derivatives     ROS:  Out of a complete 14 system review of symptoms, the patient complains only of the following symptoms, and all other reviewed systems are negative.  Fatigue Swelling in the legs Diarrhea, constipation Urinary problems Joint pain, aching muscles Allergies Numbness of  the right thigh Nonepileptic seizures  Blood pressure (!) 141/79, pulse 71, height 5\' 8"  (1.727 m), SpO2 98 %.  Physical Exam  General: The patient is alert and cooperative at the time of the examination.  The patient is morbidly obese.  Eyes: Pupils are equal, round, and reactive to light. Discs are flat bilaterally.  Neck: The neck is supple, no carotid bruits are noted.  Respiratory: The respiratory examination is clear.  Cardiovascular: The cardiovascular examination reveals a regular rate and rhythm, no obvious murmurs or rubs are noted.  Skin: Extremities are with 1+ edema below the knees bilaterally.  Neurologic Exam  Mental status: The patient is alert and oriented x 3 at the time of the examination. The patient has apparent normal recent and remote memory, with an apparently normal attention span and concentration ability.  Cranial nerves: Facial symmetry is present. There is good sensation of the face to pinprick and soft touch bilaterally. The strength of the facial muscles and the muscles to head turning and shoulder shrug are normal bilaterally. Speech is well enunciated, no aphasia or dysarthria is noted. Extraocular movements are full. Visual fields are full. The  tongue is midline, and the patient has symmetric elevation of the soft palate. No obvious hearing deficits are noted.  Motor: The motor testing reveals 5 over 5 strength of all 4 extremities. Good symmetric motor tone is noted throughout.  Sensory: Sensory testing is intact to pinprick, soft touch, vibration sensation, and position sense on all 4 extremities, with exception of a stocking pattern pinprick sensory deficit two thirds the way up the legs bilaterally.  The patient also reports sensory alteration on the anterolateral aspect of the right thigh from the hip to the knee. No evidence of extinction is noted.  Coordination: Cerebellar testing reveals good finger-nose-finger and heel-to-shin bilaterally.  Gait and station: Gait is normal. Tandem gait is essentially normal. Romberg is negative. No drift is seen.  Reflexes: Deep tendon reflexes are symmetric, but are depressed bilaterally. Toes are downgoing bilaterally.   MRI lumbar 12/24/16:  IMPRESSION: Small foraminal protrusion at L4-5 on the RIGHT. In conjunction with severe RIGHT facet arthropathy, RIGHT L4 nerve root irritation is Possible.  * MRI scan images were reviewed online. I agree with the written report.    Assessment/Plan:   1.  Morbid obesity  2.  Chronic low back pain  3.  Diabetes, diabetic peripheral neuropathy  4.  Right meralgia paresthetica  The patient does have facet joint arthritis that is likely is leading to some discomfort in the low back that occurs with standing.  The patient is fully ambulatory with good strength in the legs, yet she comes to the office today with a motorized wheelchair.  The patient has sensory alteration and pain in the distribution of the right lateral femoral cutaneous nerve consistent with meralgia paresthetica.  I have indicated that weight loss will help her low back pain and her meralgia paresthetica.  We will treat with carbamazepine for the pain of the neuropathy and for  the meralgia paresthetica.  She will begin taking 100 mg twice daily for 2 weeks and then go to 200 mg twice daily.  She will follow-up in 3 months.   Addendum: The carbamazepine has an interaction with methadone, making the methadone less effective. We will give a trial on Keppra for the meralgia paresthetica instead.  Jill Alexanders MD 10/17/2017 2:05 PM  [facility] [address] [place], [place] [address]  Phone  [phone] Fax [phone]

## 2017-10-17 NOTE — Telephone Encounter (Signed)
I called the patient.  The carbamazepine prescription may interact with the methadone and make the methadone less effective.  We will stop the carbamazepine and try Keppra instead, I contacted the patient concerning this.

## 2017-10-17 NOTE — Telephone Encounter (Signed)
Took call from phone staff. Spoke with Olivet from [facility] in [place], [place]. She stated Tegretol decreases affects of methadone that pt takes. Wanted to know if Dr Jannifer Franklin aware and wanted to proceed with filling rx Tegretol. Advised I will send message to [facility], MD to advise.  Phone to call them back: [phone].

## 2017-10-17 NOTE — Patient Instructions (Signed)
   We will start carbamazepine for the right leg pain and foot pain.  Tegretol (carbamazepine) may result in dizziness, gait instability, cognitive slowing, or drowsiness. Sometimes, and allergic rash may occur, or a photosensitive rash may occur. If any significant side effects are noted, please contact our office.

## 2018-01-17 ENCOUNTER — Ambulatory Visit: Payer: Medicare Other | Admitting: Neurology

## 2018-06-19 ENCOUNTER — Encounter (HOSPITAL_BASED_OUTPATIENT_CLINIC_OR_DEPARTMENT_OTHER): Payer: Self-pay | Admitting: *Deleted

## 2018-06-19 ENCOUNTER — Emergency Department (HOSPITAL_BASED_OUTPATIENT_CLINIC_OR_DEPARTMENT_OTHER)
Admission: EM | Admit: 2018-06-19 | Discharge: 2018-06-19 | Disposition: A | Discharge status: Home / Self Care | Payer: Medicare Other | Attending: Emergency Medicine | Admitting: Emergency Medicine

## 2018-06-19 ENCOUNTER — Other Ambulatory Visit: Payer: Self-pay

## 2018-06-19 ENCOUNTER — Emergency Department (HOSPITAL_BASED_OUTPATIENT_CLINIC_OR_DEPARTMENT_OTHER): Payer: Medicare Other

## 2018-06-19 DIAGNOSIS — E039 Hypothyroidism, unspecified: Secondary | ICD-10-CM | POA: Insufficient documentation

## 2018-06-19 DIAGNOSIS — M5432 Sciatica, left side: Secondary | ICD-10-CM | POA: Diagnosis not present

## 2018-06-19 DIAGNOSIS — Z79899 Other long term (current) drug therapy: Secondary | ICD-10-CM | POA: Diagnosis not present

## 2018-06-19 DIAGNOSIS — M79605 Pain in left leg: Secondary | ICD-10-CM | POA: Diagnosis present

## 2018-06-19 DIAGNOSIS — Z7984 Long term (current) use of oral hypoglycemic drugs: Secondary | ICD-10-CM | POA: Diagnosis not present

## 2018-06-19 DIAGNOSIS — E119 Type 2 diabetes mellitus without complications: Secondary | ICD-10-CM | POA: Diagnosis not present

## 2018-06-19 DIAGNOSIS — M549 Dorsalgia, unspecified: Secondary | ICD-10-CM

## 2018-06-19 DIAGNOSIS — J45909 Unspecified asthma, uncomplicated: Secondary | ICD-10-CM | POA: Insufficient documentation

## 2018-06-19 LAB — URINALYSIS, ROUTINE W REFLEX MICROSCOPIC
BILIRUBIN URINE: NEGATIVE
Glucose, UA: NEGATIVE mg/dL
HGB URINE DIPSTICK: NEGATIVE
Ketones, ur: NEGATIVE mg/dL
Leukocytes, UA: NEGATIVE
NITRITE: NEGATIVE
PROTEIN: NEGATIVE mg/dL
SPECIFIC GRAVITY, URINE: 1.01 (ref 1.005–1.030)
pH: 6.5 (ref 5.0–8.0)

## 2018-06-19 MED ORDER — METHYLPREDNISOLONE 4 MG PO TBPK: ORAL_TABLET | ORAL | 0 refills | Status: DC

## 2018-06-19 NOTE — ED Notes (Signed)
Patient transported to X-ray 

## 2018-06-19 NOTE — ED Notes (Signed)
Pt in ultrasound

## 2018-06-19 NOTE — ED Notes (Signed)
Pt returned from US

## 2018-06-19 NOTE — ED Provider Notes (Signed)
[facility] EMERGENCY DEPARTMENT Provider Note   CSN: [id number] Arrival date & time: 06/19/18  1631     History   Chief Complaint Chief Complaint  Patient presents with  . Back Pain    HPI Crystal [name] is a 57 y.o. female who presents the emergency department with chief complaint of left sided sciatic leg pain.  Has a history of chronic pain, morbid obesity, degenerative disc disease, interstitial cystitis, reflex sympathetic dystrophy, fibromyalgia and diabetes.  She is on chronic pain medications with daily morphine.  She has a long-standing relationship with Dr. Ronnald Ramp of neurosurgery and used to have spinal injections.  Patient states that she noticed a little bit of pain in her left lumbar region and left buttock starting on July 1.  She traveled with her husband in the RV on the second and began having severe sharp pain in the left lumbar region and shooting electrical pain down the left leg.  She states that since Wednesday she has had severe difficulty ambulating due to pain whenever she moves.  She has had difficulty performing activities of daily living including trying to get to the restroom by herself.  Her chronic pain medications have not been working.  She denies weakness, numbness, saddle anesthesia, loss of control of her bowel or bladder.  Her history of meralgia paresthetica has been on the right side and her symptoms today are new.  She denies any inciting injuries.  HPI  Past Medical History:  Diagnosis Date  . Asthma    Activity induced  . Chronic fatigue syndrome   . Chronic myofascial pain 1997   Dr Jason Fila  . DDD (degenerative disc disease), lumbar [id number]   Dr Jenny Reichmann Rendall  . Diabetes mellitus   . Endometriosis   . Endometriosis   . Fibromyalgia 1997   Dr Jason Fila  . Hypothyroidism   . IBS (irritable bowel syndrome)   . Interstitial and deep keratitis   . Interstitial cystitis    Dr Alona Bene  . Lumbago   . Morbid obesity ([place])     . Osteoarthritis of lumbar spine 2018   Dr Wandra Feinstein  . Peripheral neuropathy   . 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 Reflex sympathetic dystrophy   . Reflex sympathetic dystrophy of right lower extremity 2002   Right toe- Dr Jason Fila  . Seizures ([place])   . Sleep apnea   . Thyroid disease     Patient Active Problem List   Diagnosis Date Noted  . Meralgia paraesthetica, right 10/17/2017  . DM (diabetes mellitus) ([place]) 11/27/2012  . IBS (irritable bowel syndrome) 11/27/2012  . Dysesthesia of lower extremities 11/27/2012  . Fibromyalgia 11/27/2012  . Hypothyroidism 11/27/2012  . ALLERGY, FOOD 12/13/2007  . MORBID OBESITY 11/02/2007  . ANXIETY 11/02/2007  . VOCAL CORD DISORDER 11/02/2007  . ASTHMA 11/02/2007  . ESOPHAGEAL REFLUX 11/02/2007  . CHEST PAIN, ATYPICAL 11/02/2007    Past Surgical History:  Procedure Laterality Date  . ABDOMINAL HYSTERECTOMY  1992   [facility]- Dr Kendall Flack  . ABDOMINAL SURGERY    . BLADDER SURGERY  1997   Bladder distention, [place] (1998, 2003, 2004, 2006, 2007, 2008, 2009, 2012, 2013)( [facility]-1999, 2001, 2002) [place] (2015, 2017,)  . BUNIONECTOMY Right 2001   [facility]- Dr Melrose Nakayama  . CHOLECYSTECTOMY  1987   [facility], Dr Werner Lean  . GYNECOLOGIC CRYOSURGERY  1983   Dr Oleh Genin office  . JOINT REPLACEMENT    . KNEE ARTHROSCOPY Left  1991   [facility]- Dr Oretha Caprice  . KNEE SURGERY    . PUBOVAGINAL [facility] SYNTHETIC  1997   [facility]- Dr Alona Bene  . TUBAL LIGATION  1990   [facility]- Dr Kendall Flack     OB History   None      Home Medications    Prior to Admission medications   Medication Sig Start Date End Date Taking? Authorizing Provider  albuterol (PROVENTIL HFA;VENTOLIN HFA) 108 (90 BASE) MCG/ACT inhaler Inhale 2 puffs into the lungs every 6 (six) hours as needed.    [provider]  baclofen (LIORESAL) 10 MG tablet Take 10 mg by mouth 3 (three) times daily.    [provider]  Cholecalciferol (VITAMIN D3  PO) Take 2,000 Units daily by mouth.    [provider]  Dextrose, Diabetic Use, (GLUCOSE PO) Take 1 tablet by mouth daily as needed.    [provider]  diltiazem (CARDIZEM) 60 MG tablet Take 60 mg by mouth 2 (two) times daily.     [provider]  EPINEPHrine (EPI-PEN) 0.3 mg/0.3 mL DEVI Inject 0.3 mg into the muscle once.    [provider]  estradiol (ESTRACE VAGINAL) 0.1 MG/GM vaginal cream Place 1 Applicatorful vaginally at bedtime.    [provider]  fexofenadine-pseudoephedrine (ALLEGRA-D 24) 180-240 MG 24 hr tablet Take 1 tablet by mouth daily.    [provider]  ipratropium-albuterol (DUONEB) 0.5-2.5 (3) MG/3ML SOLN Take 3 mLs by nebulization every 4 (four) hours as needed.    [provider]  levETIRAcetam (KEPPRA) 500 MG tablet Take 1 tablet (500 mg total) 2 (two) times daily by mouth. 10/17/17   Kathrynn Ducking, MD  levothyroxine (SYNTHROID, LEVOTHROID) 50 MCG tablet Take 50 mcg by mouth daily.    [provider]  lubiprostone (AMITIZA) 24 MCG capsule Take 24 mcg by mouth 2 (two) times daily with a meal.      [provider]  metFORMIN (GLUCOPHAGE) 500 MG tablet Take 500 mg 2 (two) times daily with a meal by mouth.    [provider]  methadone (DOLOPHINE) 10 MG tablet Take 10 mg by mouth every 6 (six) hours as needed.     [provider]  midodrine (PROAMATINE) 5 MG tablet Take 10 mg by mouth 3 (three) times daily.     [provider]  Misc Natural Products (COLON-AID PO) Take 1 tablet by mouth daily as needed. Patient uses this medication for colon cleansing.    [provider]  Multiple Vitamin (DAILY VITAMIN PO) Take 2 tablets daily by mouth. Hair, skin, and nails supplement ([employer] Glow)    [provider]  Multiple Vitamins-Minerals (MULTIVITAMIN ADULT PO) Take 1 tablet 2 (two) times daily by mouth.    [provider]  potassium chloride  (K-DUR,KLOR-CON) 10 MEQ tablet Take 10 mEq by mouth 2 (two) times daily.    [provider]  Probiotic Product (PROBIOTIC PO) Take 1 Dose daily by mouth.    [provider]  Sennosides (SENOKOT PO) Take 3 tablets as needed by mouth.    [provider]  sennosides-docusate sodium (SENOKOT-S) 8.6-50 MG tablet Take 1 tablet as needed by mouth for constipation. 2-4 tablets po QD prn constipation    [provider]  valACYclovir (VALTREX) 500 MG tablet Take 500 mg 2 (two) times daily by mouth.    [provider]    Family History History reviewed. No pertinent family history.  Social History  Social History   Tobacco Use  . Smoking status: Never Smoker  . Smokeless tobacco: Never Used  Substance Use Topics  . Alcohol use: No  . Drug use: Not on file     Allergies   Flagyl [metronidazole]; Morphine; Nsaids; Other; and Sulfonamide derivatives   Review of Systems Review of Systems Ten systems reviewed and are negative for acute change, except as noted in the HPI.    Physical Exam Updated Vital Signs BP (!) 119/95   Pulse 72   Temp 98.2 F (36.8 C)   Resp 16   Ht 5\' 8"  (1.727 m)   Wt (!) 138.3 kg (305 lb)   SpO2 100%   BMI 46.38 kg/m   Physical Exam  Physical Exam  Nursing note and vitals reviewed. Constitutional: She is oriented to person, place, and time. She appears well-developed and well-nourished. No distress.  HENT:  Head: Normocephalic and atraumatic.  Eyes: Conjunctivae normal and EOM are normal. Pupils are equal, round, and reactive to light. No scleral icterus.  Neck: Normal range of motion.  Cardiovascular: Normal rate, regular rhythm and normal heart sounds.  Exam reveals no gallop and no friction rub.   No murmur heard. Pulmonary/Chest: Effort normal and breath sounds normal. No respiratory distress.  Abdominal: Soft. Bowel sounds are normal. She exhibits no distension and no mass. There is no tenderness. There  is no guarding Musculoskeletal:Patient appears to be in mild to moderate pain. Lumbosacral spine area reveals no local tenderness or mass. Painful and reduced LS ROM noted. Straight leg raise is positive at 30 degrees on left. DTR's, motor strength and sensation normal  Peripheral pulses are palpable. Calves are soft and nontender. She has fullness in the left popliteal Neurological: She is alert and oriented to person, place, and time.  Skin: Skin is warm and dry. She is not diaphoretic.    ED Treatments / Results  Labs (all labs ordered are listed, but only abnormal results are displayed) Labs Reviewed  URINALYSIS, ROUTINE W REFLEX MICROSCOPIC    EKG None  Radiology Dg Lumbar Spine Complete  Result Date: 06/19/2018 CLINICAL DATA:  Left lower back pain for 2 weeks. EXAM: LUMBAR SPINE - COMPLETE 4+ VIEW COMPARISON:  02/07/2017 lumbar spine radiographs. FINDINGS: There is no evidence of lumbar spine fracture. Alignment is normal. Vertebral body heights are maintained. Lower lumbar facet arthrosis. Mild L4-5 loss of intervertebral disc space height. Right upper quadrant surgical clips, presumably cholecystectomy. IMPRESSION: 1.  No acute fracture or dislocation identified. 2. Stable lumbar spondylosis with mild L4-5 loss of intervertebral disc space height prominent lower lumbar facet arthrosis. Electronically Signed   By: Kristine Garbe M.D.   On: 06/19/2018 17:34    Procedures Procedures (including critical care time)  Medications Ordered in ED Medications - No data to display   Initial Impression / Assessment and Plan / ED Course  I have reviewed the triage vital signs and the nursing notes.  Pertinent labs & imaging results that were available during my care of the patient were reviewed by me and considered in my medical decision making (see chart for details).     Patient with known symptoms of sciatica.  DVT study is negative.  She is under pain management contract  and on chronic methadone for pain treatment.  I discussed pain control options.  Patient having difficulty performing ADLs and will add Medrol Dosepak.  She understands that this will run her sugars high however given her severe discomfort I feel that  the benefits outweigh any negative side effects.  Her last hemoglobin A1c was 6.3 and has been under fairly good control.  No signs of infection or other life-threatening illness.  She is advised to follow with her PCP, pain management and closely with Dr. Ronnald Ramp who is her neurosurgeon.  She appears appropriate for discharge at this time.  No red flag symptoms  Final Clinical Impressions(s) / ED Diagnoses   Final diagnoses:  Back pain  Sciatica of left side    ED Discharge Orders    None       Margarita Mail, PA-C 06/19/18 2358    Little, Wenda Overland, MD 06/20/18 1556

## 2018-06-19 NOTE — Discharge Instructions (Addendum)
Contact a health care provider if:  You have pain that wakes you up when you are sleeping.  You have pain that gets worse when you lie down.  Your pain is worse than you have experienced in the past.  Your pain lasts longer than 4 weeks.  You experience unexplained weight loss.  Get help right away if:  You lose control of your bowel or bladder (incontinence).  You have:  Weakness in your lower back, pelvis, buttocks, or legs that gets worse.  Redness or swelling of your back.  A burning sensation when you urinate.

## 2018-06-19 NOTE — ED Notes (Signed)
BSC at bedside patient family member assisting patient from bed to [facility]. Call bell in reach for patient.

## 2018-06-19 NOTE — ED Notes (Signed)
ED Provider at bedside. 

## 2018-06-19 NOTE — ED Triage Notes (Addendum)
Pt c/o left lower back pain x 2 weeks Methadone 5 mg , baclofen 20mg  and tylenol 650mg  PTA

## 2018-06-19 NOTE — ED Notes (Signed)
Pt returned from xray

## 2018-06-19 NOTE — ED Notes (Signed)
Pt c/o left side lower back pain that radiates down left leg, states this is the same pain that she has had in the past on her right side.

## 2018-08-03 ENCOUNTER — Encounter: Payer: Self-pay | Admitting: Neurology

## 2018-08-03 ENCOUNTER — Ambulatory Visit (INDEPENDENT_AMBULATORY_CARE_PROVIDER_SITE_OTHER): Payer: Medicare Other | Admitting: Neurology

## 2018-08-03 VITALS — BP 139/84 | HR 72 | Ht 68.0 in

## 2018-08-03 DIAGNOSIS — M5441 Lumbago with sciatica, right side: Secondary | ICD-10-CM

## 2018-08-03 DIAGNOSIS — G8929 Other chronic pain: Secondary | ICD-10-CM

## 2018-08-03 DIAGNOSIS — G5711 Meralgia paresthetica, right lower limb: Secondary | ICD-10-CM

## 2018-08-03 DIAGNOSIS — M797 Fibromyalgia: Secondary | ICD-10-CM | POA: Diagnosis not present

## 2018-08-03 NOTE — Progress Notes (Signed)
Reason for visit: Low back pain  Crystal [name] is an 57 y.o. female  History of present illness:  Crystal [name] is a 58 year old right-handed black female with a history of morbid obesity, diabetes, and chronic low back pain.  The patient had an exacerbation of her low back pain around 19 June 2018.  The patient went to the emergency room, she was given a prednisone Dosepak and seemed to improve some with this.  The patient indicates that over the last 6 weeks that she has been very immobile, basically laying around in bed, using her motorized wheelchair to get around even in the house.  The patient reports pain across the back, and she has sciatica type pain down the right leg to the foot, in the emergency room she complained of sciatica pain in the left leg.  The patient indicates that she has had epidural steroid injections in the past that did not help.  She was placed on Keppra for meralgia paresthetica on the right when last seen, she indicates that this did not help and she stopped the medication.  She takes methadone for chronic pain.  She has developed right elbow pain consistent with a "tennis elbow".  In the past, gabapentin and Lyrica were not well-tolerated as they resulted in cognitive clouding and gait instability.  She returns for an evaluation.  She has not noted any weakness of the legs.  She reports no pain in the back or legs with sitting or lying down, only with standing.  Past Medical History:  Diagnosis Date  . Asthma    Activity induced  . Chronic fatigue syndrome   . Chronic myofascial pain 1997   Dr Jason Fila  . DDD (degenerative disc disease), lumbar [id number]   Dr Jenny Reichmann Rendall  . Diabetes mellitus   . Endometriosis   . Endometriosis   . Fibromyalgia 1997   Dr Jason Fila  . Hypothyroidism   . IBS (irritable bowel syndrome)   . Interstitial and deep keratitis   . Interstitial cystitis    Dr Alona Bene  . Lumbago   . Morbid obesity ([place])   . Osteoarthritis  of lumbar spine 2018   Dr Wandra Feinstein  . Peripheral neuropathy   . 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 Reflex sympathetic dystrophy   . Reflex sympathetic dystrophy of right lower extremity 2002   Right toe- Dr Jason Fila  . Seizures ([place])   . Sleep apnea   . Thyroid disease     Past Surgical History:  Procedure Laterality Date  . ABDOMINAL HYSTERECTOMY  1992   [facility]- Dr Kendall Flack  . ABDOMINAL SURGERY    . BLADDER SURGERY  1997   Bladder distention, [place] (1998, 2003, 2004, 2006, 2007, 2008, 2009, 2012, 2013)( [facility]-1999, 2001, 2002) [place] (2015, 2017,)  . BUNIONECTOMY Right 2001   [facility]- Dr Melrose Nakayama  . CHOLECYSTECTOMY  1987   [facility], Dr Werner Lean  . GYNECOLOGIC CRYOSURGERY  1983   Dr Oleh Genin office  . JOINT REPLACEMENT    . KNEE ARTHROSCOPY Left 1991   [facility]- Dr Jenny Reichmann Rendall  . KNEE SURGERY    . PUBOVAGINAL [facility] SYNTHETIC  1997   [facility]- Dr Alona Bene  . TUBAL LIGATION  1990   [facility]- Dr Kendall Flack    History reviewed. No pertinent family history.  Social history:  reports that she has never smoked. She has never used smokeless tobacco. She reports that she does not drink alcohol.  Her drug history is not on file.    Allergies  Allergen Reactions  . Flagyl [Metronidazole]   . Morphine     REACTION: nausea, projectile vomiting  . Nsaids   . Other     Metals: Nickel, cat dander, dog dander, hickery, ash, walnut, lambs quarter, dust, wagweed, dustmites, difar, sawdust, tar  . Sulfonamide Derivatives     Medications:  Prior to Admission medications   Medication Sig Start Date End Date Taking? Authorizing Provider  acetaminophen (TYLENOL) 650 MG CR tablet Take 650 mg by mouth daily.   Yes [provider]  albuterol (PROVENTIL HFA;VENTOLIN HFA) 108 (90 BASE) MCG/ACT inhaler Inhale 2 puffs into the lungs every 6 (six) hours as needed.   Yes [provider]  baclofen (LIORESAL) 10 MG tablet Take 10 mg by mouth 3 (three)  times daily.   Yes [provider]  Cholecalciferol (VITAMIN D3 PO) Take 2,000 Units daily by mouth.   Yes [provider]  Dextrose, Diabetic Use, (GLUCOSE PO) Take 1 tablet by mouth daily as needed.   Yes [provider]  EPINEPHrine (EPI-PEN) 0.3 mg/0.3 mL DEVI Inject 0.3 mg into the muscle once.   Yes [provider]  estradiol (ESTRACE VAGINAL) 0.1 MG/GM vaginal cream Place 1 Applicatorful vaginally at bedtime.   Yes [provider]  fexofenadine-pseudoephedrine (ALLEGRA-D 24) 180-240 MG 24 hr tablet Take 1 tablet by mouth daily.   Yes [provider]  glipiZIDE (GLUCOTROL) 10 MG tablet Take 10 mg by mouth 2 (two) times daily before a meal.   Yes [provider]  ipratropium-albuterol (DUONEB) 0.5-2.5 (3) MG/3ML SOLN Take 3 mLs by nebulization every 4 (four) hours as needed.   Yes [provider]  levothyroxine (SYNTHROID, LEVOTHROID) 50 MCG tablet Take 50 mcg by mouth daily.   Yes [provider]  lubiprostone (AMITIZA) 24 MCG capsule Take 24 mcg by mouth 2 (two) times daily with a meal.     Yes [provider]  metFORMIN (GLUCOPHAGE) 500 MG tablet Take 500 mg 2 (two) times daily with a meal by mouth.   Yes [provider]  methadone (DOLOPHINE) 10 MG tablet Take 10 mg by mouth every 6 (six) hours as needed.    Yes [provider]  Misc Natural Products (COLON-AID PO) Take 1 tablet by mouth daily as needed. Patient uses this medication for colon cleansing.   Yes [provider]  Multiple Vitamins-Minerals (MULTIVITAMIN ADULT PO) Take 1 tablet 2 (two) times daily by mouth.   Yes [provider]  potassium chloride (K-DUR,KLOR-CON) 10 MEQ tablet Take 10 mEq by mouth 2 (two) times daily.   Yes [provider]  Probiotic Product (PROBIOTIC PO) Take 1 Dose daily by mouth.   Yes [provider]  Sennosides (SENOKOT PO) Take 3 tablets as needed by mouth.   Yes  [provider]  sennosides-docusate sodium (SENOKOT-S) 8.6-50 MG tablet Take 1 tablet as needed by mouth for constipation. 2-4 tablets po QD prn constipation   Yes [provider]  valACYclovir (VALTREX) 500 MG tablet Take 500 mg 2 (two) times daily by mouth.   Yes [provider]    ROS:  Out of a complete 14 system review of symptoms, the patient complains only of the following symptoms, and all other reviewed systems are negative.  Constipation, diarrhea Environmental allergies Difficulty urinating, painful urination, frequency of urination Joint pain, back pain, aching muscles, walking difficulty Right thigh numbness  Blood pressure  139/84, pulse 72, height 5\' 8"  (1.727 m), SpO2 98 %.  Physical Exam  General: The patient is alert and cooperative at the time of the examination.  The patient is morbidly obese.  Neuromuscular: The patient actually has fairly good range of movement with flexion, extension, and rotational movement of the low back.  Skin: No significant peripheral edema is noted.   Neurologic Exam  Mental status: The patient is alert and oriented x 3 at the time of the examination. The patient has apparent normal recent and remote memory, with an apparently normal attention span and concentration ability.   Cranial nerves: Facial symmetry is present. Speech is normal, no aphasia or dysarthria is noted. Extraocular movements are full. Visual fields are full.  Motor: The patient has good strength in all 4 extremities.  Sensory examination: Soft touch sensation is symmetric on the face, arms, and legs.  Coordination: The patient has good finger-nose-finger and heel-to-shin bilaterally.  Gait and station: The patient has a normal gait. Tandem gait is minimally unsteady. Romberg is negative. No drift is seen.  The patient is able to walk on heels and toes bilaterally.  Reflexes: Deep tendon reflexes are symmetric, but are  depressed.   Assessment/Plan:  1.  Morbid obesity  2.  Chronic low back pain with recent exacerbation  3.  Right meralgia paresthetica  The patient likely has a facet joint induced pain involving the back and legs.  The patient is morbidly obese, I have recommended an aggressive weight loss program, the patient is extremely immobile at home, the patient will be sent for physical therapy.  I have recommended that following physical therapy that she enter into a regular nonweightbearing exercise program such as swimming.  Weight loss is imperative if she is going to improve her low back pain.  The patient does not wish to undergo an epidural steroid injection.  It is possible that a facet joint injection in the future may be of some benefit.  If the pain does not improve, MRI of the lumbar spine will be repeated.  Jill Alexanders MD 08/03/2018 1:54 PM  [facility] [address] [place], [place] [address]  Phone [phone] Fax [phone]

## 2018-11-23 ENCOUNTER — Ambulatory Visit: Payer: Medicare Other | Admitting: Neurology

## 2018-12-29 ENCOUNTER — Other Ambulatory Visit: Payer: Self-pay | Admitting: Internal Medicine

## 2018-12-29 DIAGNOSIS — R079 Chest pain, unspecified: Secondary | ICD-10-CM

## 2019-01-02 ENCOUNTER — Ambulatory Visit
Admission: RE | Admit: 2019-01-02 | Discharge: 2019-01-02 | Disposition: A | Discharge status: Home / Self Care | Payer: Medicare Other | Source: Ambulatory Visit | Attending: Internal Medicine | Admitting: Internal Medicine

## 2019-01-02 DIAGNOSIS — R079 Chest pain, unspecified: Secondary | ICD-10-CM

## 2019-01-11 ENCOUNTER — Other Ambulatory Visit: Payer: Self-pay | Admitting: Gastroenterology

## 2019-03-13 ENCOUNTER — Ambulatory Visit (HOSPITAL_COMMUNITY): Admission: RE | Admit: 2019-03-13 | Payer: Medicare Other | Source: Home / Self Care | Admitting: Gastroenterology

## 2019-03-13 ENCOUNTER — Encounter (HOSPITAL_COMMUNITY): Admission: RE | Payer: Self-pay | Source: Home / Self Care

## 2019-03-13 SURGERY — COLONOSCOPY WITH PROPOFOL
Anesthesia: Monitor Anesthesia Care

## 2019-05-03 ENCOUNTER — Telehealth: Payer: Self-pay | Admitting: *Deleted

## 2019-05-03 NOTE — Telephone Encounter (Signed)
Last office visit with Dr. Jannifer Franklin 08-03-18 ok to see NP.  Called to r/s appt with Dr. Tobey Grim NP for same day. SSlack, NP.06-14-19 at 1515. If pt returns call and states ok, please make change.

## 2019-05-09 NOTE — Telephone Encounter (Signed)
Pt called and asked the appointment be canceled, she states it is not needed. No call back requested.

## 2019-05-10 NOTE — Telephone Encounter (Signed)
Noted  

## 2019-06-14 ENCOUNTER — Ambulatory Visit: Payer: Medicare Other | Admitting: Neurology

## 2019-06-22 ENCOUNTER — Other Ambulatory Visit: Payer: Self-pay | Admitting: Gastroenterology

## 2019-06-26 ENCOUNTER — Other Ambulatory Visit: Payer: Self-pay | Admitting: Internal Medicine

## 2019-06-26 ENCOUNTER — Other Ambulatory Visit: Payer: Self-pay

## 2019-06-26 ENCOUNTER — Ambulatory Visit
Admission: RE | Admit: 2019-06-26 | Discharge: 2019-06-26 | Disposition: A | Discharge status: Home / Self Care | Payer: Medicare Other | Source: Ambulatory Visit | Attending: Internal Medicine | Admitting: Internal Medicine

## 2019-06-26 DIAGNOSIS — R0902 Hypoxemia: Secondary | ICD-10-CM

## 2019-06-26 DIAGNOSIS — R0602 Shortness of breath: Secondary | ICD-10-CM

## 2019-08-01 ENCOUNTER — Other Ambulatory Visit: Payer: Self-pay | Admitting: Gastroenterology

## 2019-08-03 ENCOUNTER — Other Ambulatory Visit (HOSPITAL_COMMUNITY)
Admission: RE | Admit: 2019-08-03 | Discharge: 2019-08-03 | Disposition: A | Discharge status: Home / Self Care | Payer: Medicare Other | Source: Ambulatory Visit | Attending: Gastroenterology | Admitting: Gastroenterology

## 2019-08-03 DIAGNOSIS — Z01812 Encounter for preprocedural laboratory examination: Secondary | ICD-10-CM | POA: Diagnosis present

## 2019-08-03 DIAGNOSIS — Z20828 Contact with and (suspected) exposure to other viral communicable diseases: Secondary | ICD-10-CM | POA: Diagnosis not present

## 2019-08-03 LAB — SARS CORONAVIRUS 2 (TAT 6-24 HRS): SARS Coronavirus 2: NEGATIVE

## 2019-08-06 ENCOUNTER — Other Ambulatory Visit: Payer: Self-pay

## 2019-08-06 ENCOUNTER — Encounter (HOSPITAL_COMMUNITY): Payer: Self-pay | Admitting: Emergency Medicine

## 2019-08-06 NOTE — Anesthesia Preprocedure Evaluation (Addendum)
Anesthesia Evaluation  Patient identified by MRN, date of birth, ID band Patient awake    Reviewed: Allergy & Precautions, H&P , NPO status , Patient's Chart, lab work & pertinent test results  History of Anesthesia Complications (+) PSEUDOCHOLINESTERASE DEFICIENCY  Airway Mallampati: II  TM Distance: >3 FB Neck ROM: Full    Dental no notable dental hx. (+) Teeth Intact   Pulmonary asthma , sleep apnea ,    Pulmonary exam normal breath sounds clear to auscultation       Cardiovascular + CAD  Normal cardiovascular exam Rhythm:Regular Rate:Normal     Neuro/Psych Anxiety  Neuromuscular disease    GI/Hepatic Neg liver ROS, GERD  ,  Endo/Other  diabetes, Type 2Hypothyroidism   Renal/GU negative Renal ROS     Musculoskeletal  (+) Fibromyalgia -  Abdominal (+) + obese,   Peds  Hematology   Anesthesia Other Findings   Reproductive/Obstetrics                            Anesthesia Physical Anesthesia Plan  ASA: III  Anesthesia Plan: MAC   Post-op Pain Management:    Induction: Intravenous  PONV Risk Score and Plan: 2 and Treatment may vary due to age or medical condition  Airway Management Planned: Natural Airway and Nasal Cannula  Additional Equipment:   Intra-op Plan:   Post-operative Plan:   Informed Consent: I have reviewed the patients History and Physical, chart, labs and discussed the procedure including the risks, benefits and alternatives for the proposed anesthesia with the patient or authorized representative who has indicated his/her understanding and acceptance.     Dental advisory given  Plan Discussed with: CRNA  Anesthesia Plan Comments:        Anesthesia Quick Evaluation

## 2019-08-06 NOTE — Progress Notes (Signed)
Pre-op endo call complete

## 2019-08-07 ENCOUNTER — Ambulatory Visit (HOSPITAL_COMMUNITY)
Admission: RE | Admit: 2019-08-07 | Discharge: 2019-08-07 | Disposition: A | Discharge status: Home / Self Care | Payer: Medicare Other | Attending: Gastroenterology | Admitting: Gastroenterology

## 2019-08-07 ENCOUNTER — Encounter (HOSPITAL_COMMUNITY)
Admission: RE | Disposition: A | Discharge status: Home / Self Care | Payer: Self-pay | Source: Home / Self Care | Attending: Gastroenterology

## 2019-08-07 ENCOUNTER — Ambulatory Visit (HOSPITAL_COMMUNITY): Payer: Medicare Other | Admitting: Certified Registered"

## 2019-08-07 ENCOUNTER — Encounter (HOSPITAL_COMMUNITY): Payer: Self-pay | Admitting: Certified Registered"

## 2019-08-07 DIAGNOSIS — Z8249 Family history of ischemic heart disease and other diseases of the circulatory system: Secondary | ICD-10-CM | POA: Diagnosis not present

## 2019-08-07 DIAGNOSIS — Z90710 Acquired absence of both cervix and uterus: Secondary | ICD-10-CM | POA: Diagnosis not present

## 2019-08-07 DIAGNOSIS — E119 Type 2 diabetes mellitus without complications: Secondary | ICD-10-CM | POA: Insufficient documentation

## 2019-08-07 DIAGNOSIS — Z79899 Other long term (current) drug therapy: Secondary | ICD-10-CM | POA: Insufficient documentation

## 2019-08-07 DIAGNOSIS — K640 First degree hemorrhoids: Secondary | ICD-10-CM | POA: Diagnosis not present

## 2019-08-07 DIAGNOSIS — Z885 Allergy status to narcotic agent status: Secondary | ICD-10-CM | POA: Insufficient documentation

## 2019-08-07 DIAGNOSIS — E785 Hyperlipidemia, unspecified: Secondary | ICD-10-CM | POA: Insufficient documentation

## 2019-08-07 DIAGNOSIS — G709 Myoneural disorder, unspecified: Secondary | ICD-10-CM | POA: Diagnosis not present

## 2019-08-07 DIAGNOSIS — G4730 Sleep apnea, unspecified: Secondary | ICD-10-CM | POA: Diagnosis not present

## 2019-08-07 DIAGNOSIS — D120 Benign neoplasm of cecum: Secondary | ICD-10-CM | POA: Diagnosis not present

## 2019-08-07 DIAGNOSIS — K6389 Other specified diseases of intestine: Secondary | ICD-10-CM | POA: Insufficient documentation

## 2019-08-07 DIAGNOSIS — K219 Gastro-esophageal reflux disease without esophagitis: Secondary | ICD-10-CM | POA: Diagnosis not present

## 2019-08-07 DIAGNOSIS — Z9049 Acquired absence of other specified parts of digestive tract: Secondary | ICD-10-CM | POA: Insufficient documentation

## 2019-08-07 DIAGNOSIS — Z882 Allergy status to sulfonamides status: Secondary | ICD-10-CM | POA: Diagnosis not present

## 2019-08-07 DIAGNOSIS — F419 Anxiety disorder, unspecified: Secondary | ICD-10-CM | POA: Insufficient documentation

## 2019-08-07 DIAGNOSIS — Z6841 Body Mass Index (BMI) 40.0 and over, adult: Secondary | ICD-10-CM | POA: Diagnosis not present

## 2019-08-07 DIAGNOSIS — I2510 Atherosclerotic heart disease of native coronary artery without angina pectoris: Secondary | ICD-10-CM | POA: Insufficient documentation

## 2019-08-07 DIAGNOSIS — Z886 Allergy status to analgesic agent status: Secondary | ICD-10-CM | POA: Diagnosis not present

## 2019-08-07 DIAGNOSIS — J45909 Unspecified asthma, uncomplicated: Secondary | ICD-10-CM | POA: Diagnosis not present

## 2019-08-07 DIAGNOSIS — M797 Fibromyalgia: Secondary | ICD-10-CM | POA: Insufficient documentation

## 2019-08-07 DIAGNOSIS — I10 Essential (primary) hypertension: Secondary | ICD-10-CM | POA: Insufficient documentation

## 2019-08-07 DIAGNOSIS — K625 Hemorrhage of anus and rectum: Secondary | ICD-10-CM | POA: Diagnosis present

## 2019-08-07 DIAGNOSIS — E039 Hypothyroidism, unspecified: Secondary | ICD-10-CM | POA: Insufficient documentation

## 2019-08-07 DIAGNOSIS — K921 Melena: Secondary | ICD-10-CM | POA: Diagnosis present

## 2019-08-07 DIAGNOSIS — Z833 Family history of diabetes mellitus: Secondary | ICD-10-CM | POA: Diagnosis not present

## 2019-08-07 HISTORY — PX: POLYPECTOMY: SHX5525

## 2019-08-07 HISTORY — PX: COLONOSCOPY WITH PROPOFOL: SHX5780

## 2019-08-07 HISTORY — DX: Presence of spectacles and contact lenses: Z97.3

## 2019-08-07 HISTORY — DX: Dependence on wheelchair: Z99.3

## 2019-08-07 HISTORY — DX: Other complications of anesthesia, initial encounter: T88.59XA

## 2019-08-07 HISTORY — DX: Family history of other specified conditions: Z84.89

## 2019-08-07 LAB — GLUCOSE, CAPILLARY: Glucose-Capillary: 144 mg/dL — ABNORMAL HIGH (ref 70–99)

## 2019-08-07 SURGERY — COLONOSCOPY WITH PROPOFOL
Anesthesia: Monitor Anesthesia Care

## 2019-08-07 MED ORDER — SODIUM CHLORIDE 0.9 % IV SOLN: INTRAVENOUS | Status: DC

## 2019-08-07 MED ORDER — KETAMINE HCL 10 MG/ML IJ SOLN
INTRAMUSCULAR | Status: AC
  Filled 2019-08-07: qty 1

## 2019-08-07 MED ORDER — LIDOCAINE 2% (20 MG/ML) 5 ML SYRINGE
INTRAMUSCULAR | Status: DC | PRN
  Administered 2019-08-07: 40 mg via INTRAVENOUS
  Administered 2019-08-07: 60 mg via INTRAVENOUS

## 2019-08-07 MED ORDER — PROPOFOL 10 MG/ML IV BOLUS
INTRAVENOUS | Status: DC | PRN
  Administered 2019-08-07: 20 mg via INTRAVENOUS
  Administered 2019-08-07: 30 mg via INTRAVENOUS
  Administered 2019-08-07 (×2): 20 mg via INTRAVENOUS
  Administered 2019-08-07: 40 mg via INTRAVENOUS
  Administered 2019-08-07: 20 mg via INTRAVENOUS
  Administered 2019-08-07: 10 mg via INTRAVENOUS
  Administered 2019-08-07: 20 mg via INTRAVENOUS
  Administered 2019-08-07 (×2): 40 mg via INTRAVENOUS
  Administered 2019-08-07 (×5): 20 mg via INTRAVENOUS
  Administered 2019-08-07: 40 mg via INTRAVENOUS
  Administered 2019-08-07 (×2): 20 mg via INTRAVENOUS
  Administered 2019-08-07 (×2): 40 mg via INTRAVENOUS
  Administered 2019-08-07: 20 mg via INTRAVENOUS

## 2019-08-07 MED ORDER — PROPOFOL 10 MG/ML IV BOLUS
INTRAVENOUS | Status: AC
  Filled 2019-08-07: qty 40

## 2019-08-07 MED ORDER — LACTATED RINGERS IV SOLN
INTRAVENOUS | Status: DC
  Administered 2019-08-07: 08:00:00 via INTRAVENOUS

## 2019-08-07 MED ORDER — PROPOFOL 10 MG/ML IV BOLUS
INTRAVENOUS | Status: AC
  Filled 2019-08-07: qty 20

## 2019-08-07 SURGICAL SUPPLY — 22 items

## 2019-08-07 NOTE — Transfer of Care (Signed)
Immediate Anesthesia Transfer of Care Note  Patient: Crystal [name]  Procedure(s) Performed: COLONOSCOPY WITH PROPOFOL (N/A ) POLYPECTOMY  Patient Location: PACU and Endoscopy Unit  Anesthesia Type:MAC  Level of Consciousness: awake and alert   Airway & Oxygen Therapy: Patient Spontanous Breathing and Patient connected to face mask oxygen  Post-op Assessment: Report given to RN and Post -op Vital signs reviewed and stable  Post vital signs: Reviewed and stable  Last Vitals:  Vitals Value Taken Time  BP 151/52 08/07/19 1030  Temp 36.7 C 08/07/19 1014  Pulse 79 08/07/19 1032  Resp 13 08/07/19 1032  SpO2 97 % 08/07/19 1032  Vitals shown include unvalidated device data.  Last Pain:  Vitals:   08/07/19 1020  TempSrc:   PainSc: (P) 4          Complications: No apparent anesthesia complications

## 2019-08-07 NOTE — Anesthesia Procedure Notes (Signed)
Procedure Name: MAC Date/Time: 08/07/2019 9:20 AM Performed by: Cynda Familia, CRNA Pre-anesthesia Checklist: Patient identified, Emergency Drugs available, Suction available, Patient being monitored and Timeout performed Patient Re-evaluated:Patient Re-evaluated prior to induction Oxygen Delivery Method: Simple face mask Placement Confirmation: positive ETCO2 and breath sounds checked- equal and bilateral Dental Injury: Teeth and Oropharynx as per pre-operative assessment

## 2019-08-07 NOTE — Anesthesia Postprocedure Evaluation (Signed)
Anesthesia Post Note  Patient: Crystal [name]  Procedure(s) Performed: COLONOSCOPY WITH PROPOFOL (N/A ) POLYPECTOMY     Patient location during evaluation: Endoscopy Anesthesia Type: MAC Level of consciousness: awake and alert Pain management: pain level controlled Vital Signs Assessment: post-procedure vital signs reviewed and stable Respiratory status: spontaneous breathing, nonlabored ventilation, respiratory function stable and patient connected to nasal cannula oxygen Cardiovascular status: blood pressure returned to baseline and stable Postop Assessment: no apparent nausea or vomiting Anesthetic complications: no    Last Vitals:  Vitals:   08/07/19 1030 08/07/19 1040  BP: (!) 151/52 (!) 142/54  Pulse: 77 68  Resp: 13 11  Temp:    SpO2: 100% 98%    Last Pain:  Vitals:   08/07/19 1040  TempSrc:   PainSc: 0-No pain                 Barnet Glasgow

## 2019-08-07 NOTE — Interval H&P Note (Signed)
History and Physical Interval Note:  08/07/2019 9:13 AM  Crystal [name]  has presented today for surgery, with the diagnosis of Rectal bleeding.  The various methods of treatment have been discussed with the patient and family. After consideration of risks, benefits and other options for treatment, the patient has consented to  Procedure(s): COLONOSCOPY WITH PROPOFOL (N/A) as a surgical intervention.  The patient's history has been reviewed, patient examined, no change in status, stable for surgery.  I have reviewed the patient's chart and labs.  Questions were answered to the patient's satisfaction.     Lear Ng

## 2019-08-07 NOTE — Op Note (Signed)
[facility] Patient Name: Crystal [name] Procedure Date: 08/07/2019 MRN: [id number] Attending MD: Lear Ng , MD Date of Birth: Feb 04, 1961 CSN: [id number] Age: 58 Admit Type: Inpatient Procedure:                Colonoscopy Indications:              Last colonoscopy: April 2009, Hematochezia Providers:                Lear Ng, MD, Angus Seller, Ladona Ridgel, Technician Referring MD:             Carolann Littler. Dean Medicines:                Propofol per Anesthesia, Monitored Anesthesia Care Complications:            No immediate complications. Estimated Blood Loss:     Estimated blood loss was minimal. Procedure:                Pre-Anesthesia Assessment:                           - Prior to the procedure, a History and Physical                            was performed, and patient medications and                            allergies were reviewed. The patient's tolerance of                            previous anesthesia was also reviewed. The risks                            and benefits of the procedure and the sedation                            options and risks were discussed with the patient.                            All questions were answered, and informed consent                            was obtained. Prior Anticoagulants: The patient has                            taken no previous anticoagulant or antiplatelet                            agents. ASA Grade Assessment: III - A patient with                            severe systemic disease. After reviewing the risks  and benefits, the patient was deemed in                            satisfactory condition to undergo the procedure.                           After obtaining informed consent, the colonoscope                            was passed under direct vision. Throughout the                            procedure, the patient's blood  pressure, pulse, and                            oxygen saturations were monitored continuously. The                            [id number]) Olympus pediatric colonscope                            was introduced through the anus and advanced to the                            the cecum, identified by appendiceal orifice and                            ileocecal valve. The colonoscopy was performed with                            difficulty due to significant looping, a tortuous                            colon, the patient's discomfort during the                            procedure and fair prep. Successful completion of                            the procedure was aided by increasing the dose of                            sedation medication, straightening and shortening                            the scope to obtain bowel loop reduction, using                            scope torsion, applying abdominal pressure and                            lavage. The patient tolerated the procedure fairly  well. The quality of the bowel preparation was fair                            and fair but repeated irrigation led to a good and                            adequate prep. The ileocecal valve, appendiceal                            orifice, and rectum were photographed. Scope In: 9:27:51 AM Scope Out: 10:07:12 AM Scope Withdrawal Time: 0 hours 20 minutes 38 seconds  Total Procedure Duration: 0 hours 39 minutes 21 seconds  Findings:      The perianal and digital rectal examinations were normal.      A 10 mm polyp was found in the cecum. The polyp was semi-sessile. The       polyp was removed with a hot snare. Resection and retrieval were       complete. Estimated blood loss was minimal.      A diffuse area of moderate melanosis was found in the entire colon.      Internal hemorrhoids were found during retroflexion. The hemorrhoids       were small and Grade I  (internal hemorrhoids that do not prolapse). Impression:               - Preparation of the colon was fair.                           - One 10 mm polyp in the cecum, removed with a hot                            snare. Resected and retrieved.                           - Melanosis in the colon.                           - Internal hemorrhoids. Moderate Sedation:      Not Applicable - Patient had care per Anesthesia. Recommendation:           - Await pathology results.                           - No aspirin, ibuprofen, naproxen, or other                            non-steroidal anti-inflammatory drugs for 2 weeks.                           - Repeat colonoscopy for surveillance based on                            pathology results.                           - High fiber diet.                           -  Patient has a contact number available for                            emergencies. The signs and symptoms of potential                            delayed complications were discussed with the                            patient. Return to normal activities tomorrow.                            Written discharge instructions were provided to the                            patient. Procedure Code(s):        --- Professional ---                           [phone], Colonoscopy, flexible; with removal of                            tumor(s), polyp(s), or other lesion(s) by snare                            technique Diagnosis Code(s):        --- Professional ---                           K92.1, Melena (includes Hematochezia)                           K63.5, Polyp of colon                           K64.0, First degree hemorrhoids                           K63.89, Other specified diseases of intestine CPT copyright 2019 [employer] Association. All rights reserved. The codes documented in this report are preliminary and upon coder review may  be revised to meet current compliance  requirements. Lear Ng, MD 08/07/2019 10:22:24 AM This report has been signed electronically. Number of Addenda: 0

## 2019-08-07 NOTE — Discharge Instructions (Signed)
Will call you with pathology results of colon polyp when complete. Avoid aspirin or ibuprofen products for 2 weeks.

## 2019-08-07 NOTE — H&P (Signed)
Date of Initial H&P: 08/01/19  History reviewed, patient examined, no change in status, stable for surgery.

## 2019-08-09 ENCOUNTER — Encounter (HOSPITAL_COMMUNITY): Payer: Self-pay | Admitting: Gastroenterology

## 2021-08-06 ENCOUNTER — Ambulatory Visit
Admission: RE | Admit: 2021-08-06 | Discharge: 2021-08-06 | Disposition: A | Discharge status: Home / Self Care | Payer: Medicare Other | Source: Ambulatory Visit | Attending: Internal Medicine | Admitting: Internal Medicine

## 2021-08-06 ENCOUNTER — Other Ambulatory Visit: Payer: Self-pay | Admitting: Internal Medicine

## 2021-08-06 DIAGNOSIS — M25511 Pain in right shoulder: Secondary | ICD-10-CM

## 2021-08-06 DIAGNOSIS — M25552 Pain in left hip: Secondary | ICD-10-CM

## 2021-11-14 IMAGING — CR DG HIP (WITH OR WITHOUT PELVIS) 2-3V*L*
2 series · 2 of 2 positions shown · non-contrast
Comparison: None.

CLINICAL DATA: Left lateral hip pain

EXAM:
DG HIP (WITH OR WITHOUT PELVIS) 2-3V LEFT

[t hip ap left]
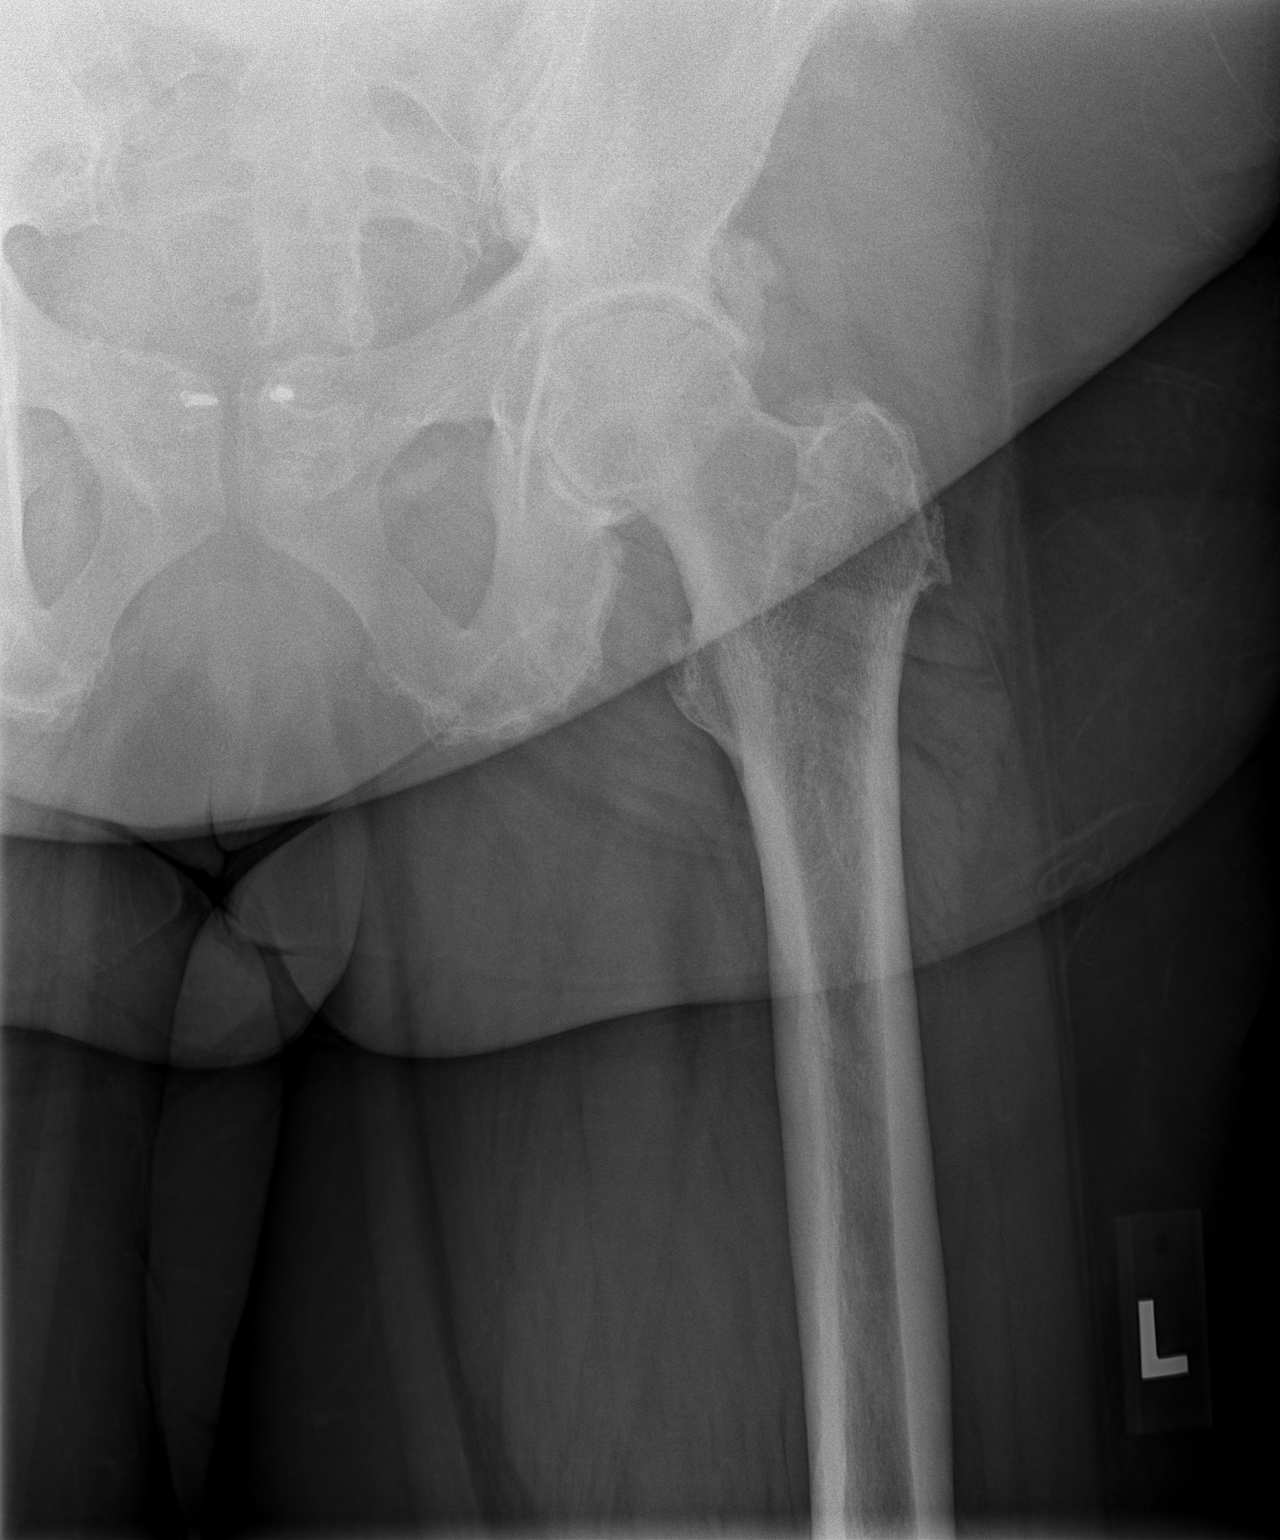

[t hip frog left]
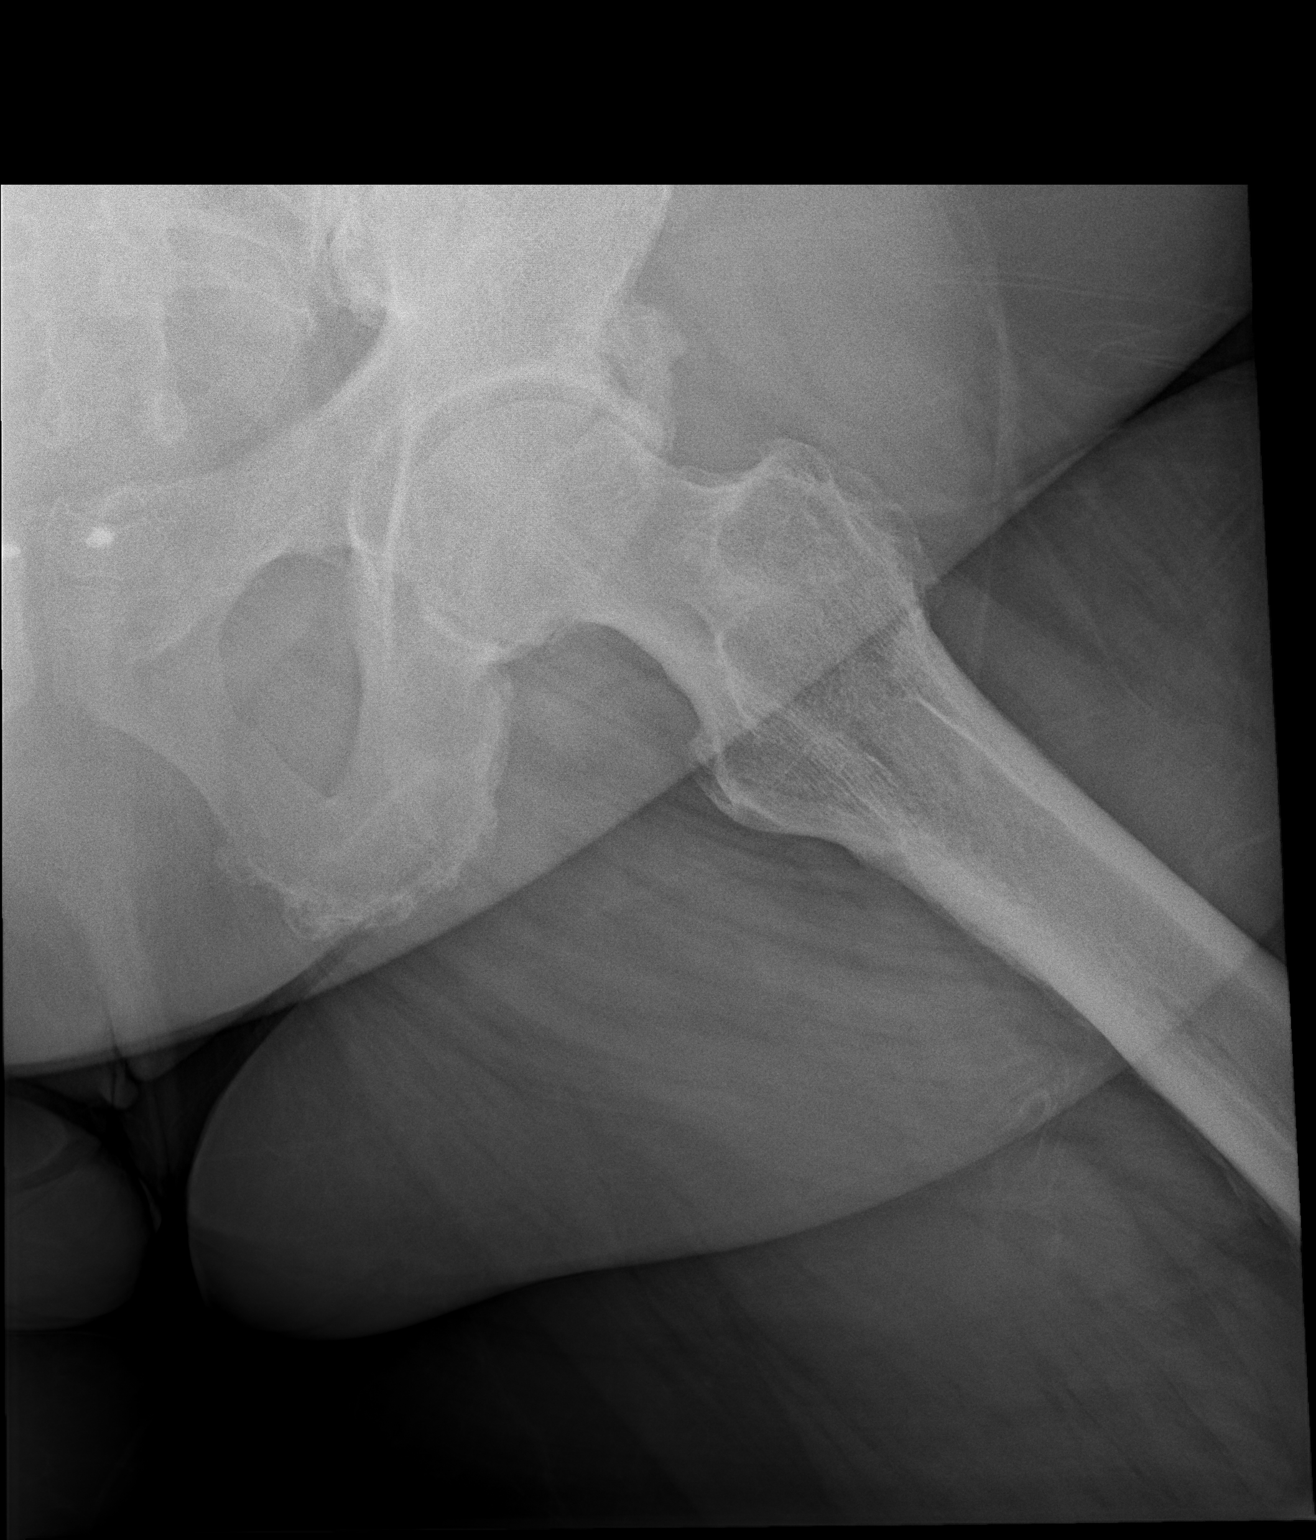

[2 of 2 positions shown; findings below may reference images not displayed]

FINDINGS: Advanced left hip degenerative arthropathy with joint space loss,
acetabular and femoral head sclerosis and bony hypertrophy. Normal
femoral head contour. No acute osseous finding or fracture. Included
left hemipelvis demonstrates left SI joint sclerosis and
arthropathy.
IMPRESSION: Degenerative changes as above.  No acute osseous finding.

## 2022-05-10 ENCOUNTER — Emergency Department (HOSPITAL_BASED_OUTPATIENT_CLINIC_OR_DEPARTMENT_OTHER)
Admission: EM | Admit: 2022-05-10 | Discharge: 2022-05-10 | Disposition: A | Discharge status: Home / Self Care | Payer: Medicare Other | Attending: Emergency Medicine | Admitting: Emergency Medicine

## 2022-05-10 ENCOUNTER — Other Ambulatory Visit: Payer: Self-pay

## 2022-05-10 DIAGNOSIS — T4395XA Adverse effect of unspecified psychotropic drug, initial encounter: Secondary | ICD-10-CM | POA: Diagnosis not present

## 2022-05-10 DIAGNOSIS — T50905A Adverse effect of unspecified drugs, medicaments and biological substances, initial encounter: Secondary | ICD-10-CM

## 2022-05-10 DIAGNOSIS — R42 Dizziness and giddiness: Secondary | ICD-10-CM | POA: Insufficient documentation

## 2022-05-10 LAB — CBC WITH DIFFERENTIAL/PLATELET
Abs Immature Granulocytes: 0.04 10*3/uL (ref 0.00–0.07)
Basophils Absolute: 0 10*3/uL (ref 0.0–0.1)
Basophils Relative: 0 %
Eosinophils Absolute: 0 10*3/uL (ref 0.0–0.5)
Eosinophils Relative: 0 %
HCT: 43.8 % (ref 36.0–46.0)
Hemoglobin: 13.6 g/dL (ref 12.0–15.0)
Immature Granulocytes: 0 %
Lymphocytes Relative: 15 %
Lymphs Abs: 1.5 10*3/uL (ref 0.7–4.0)
MCH: 26.1 pg (ref 26.0–34.0)
MCHC: 31.1 g/dL (ref 30.0–36.0)
MCV: 84.1 fL (ref 80.0–100.0)
Monocytes Absolute: 0.6 10*3/uL (ref 0.1–1.0)
Monocytes Relative: 6 %
Neutro Abs: 7.5 10*3/uL (ref 1.7–7.7)
Neutrophils Relative %: 79 %
Platelets: 290 10*3/uL (ref 150–400)
RBC: 5.21 MIL/uL — ABNORMAL HIGH (ref 3.87–5.11)
RDW: 15.3 % (ref 11.5–15.5)
WBC: 9.6 10*3/uL (ref 4.0–10.5)
nRBC: 0 % (ref 0.0–0.2)

## 2022-05-10 LAB — BASIC METABOLIC PANEL
Anion gap: 9 (ref 5–15)
BUN: 8 mg/dL (ref 8–23)
CO2: 28 mmol/L (ref 22–32)
Calcium: 9 mg/dL (ref 8.9–10.3)
Chloride: 103 mmol/L (ref 98–111)
Creatinine, Ser: 0.98 mg/dL (ref 0.44–1.00)
GFR, Estimated: >60 mL/min (ref >60)
Glucose, Bld: 145 mg/dL — ABNORMAL HIGH (ref 70–99)
Potassium: 4.5 mmol/L (ref 3.5–5.1)
Sodium: 140 mmol/L (ref 135–145)

## 2022-05-10 LAB — URINALYSIS, ROUTINE W REFLEX MICROSCOPIC
Bilirubin Urine: NEGATIVE
Glucose, UA: NEGATIVE mg/dL
Hgb urine dipstick: NEGATIVE
Ketones, ur: NEGATIVE mg/dL
Leukocytes,Ua: NEGATIVE
Nitrite: NEGATIVE
Protein, ur: NEGATIVE mg/dL
Specific Gravity, Urine: 1.01 (ref 1.005–1.030)
pH: 5.5 (ref 5.0–8.0)

## 2022-05-10 NOTE — ED Triage Notes (Signed)
Reports she woke up feeling flushed and like her tongue felt numb.  Patient reports she took one CBD gummy around midnight.  Reports she felt numb all over her body but the feeling was better now.

## 2022-05-10 NOTE — ED Provider Notes (Signed)
[facility] Provider Note   CSN: [id number] Arrival date & time: 05/10/22  0524     History  Chief Complaint  Patient presents with   Allergic Reaction    Crystal [name] is a 61 y.o. female.  The history is provided by the patient.  Allergic Reaction Presenting symptoms: no difficulty breathing, no difficulty swallowing and no wheezing   Presenting symptoms comment:  Mouth was dry, and feels weird all over after taking CBD gummy with her methadone Prior allergic episodes:  No prior episodes Context: not jewelry/metal   Relieved by:  Nothing Worsened by:  Nothing Ineffective treatments:  None tried Patient with chronic pain took her methadone and then a a CBD gummy and felt weird all over and mouth was dry.  No weakness.  No changes in vision or speech.  No CP.  No swelling.      Home Medications Prior to Admission medications   Medication Sig Start Date End Date Taking? Authorizing Provider  acetaminophen (TYLENOL) 650 MG CR tablet Take 1,300 mg by mouth daily.     [provider]  albuterol (PROVENTIL HFA;VENTOLIN HFA) 108 (90 BASE) MCG/ACT inhaler Inhale 2 puffs into the lungs every 6 (six) hours as needed for shortness of breath.     [provider]  baclofen (LIORESAL) 20 MG tablet Take 20 mg by mouth 3 (three) times daily.     [provider]  Cholecalciferol (VITAMIN D3) 50 MCG (2000 UT) capsule Take 2,000 Units by mouth daily.     [provider]  EPINEPHrine (EPI-PEN) 0.3 mg/0.3 mL DEVI Inject 0.3 mg into the muscle once as needed (anaphylaxis).     [provider]  fexofenadine (ALLEGRA) 180 MG tablet Take 180 mg by mouth daily.    [provider]  glipiZIDE (GLUCOTROL) 10 MG tablet Take 10 mg by mouth 2 (two) times daily before a meal.    [provider]  ipratropium-albuterol (DUONEB) 0.5-2.5 (3) MG/3ML SOLN Take 3 mLs by nebulization every 4 (four) hours as needed (asthma).      [provider]  levothyroxine (SYNTHROID, LEVOTHROID) 50 MCG tablet Take 50 mcg by mouth daily before breakfast.     [provider]  lubiprostone (AMITIZA) 24 MCG capsule Take 24 mcg by mouth 2 (two) times daily with a meal.      [provider]  methadone (DOLOPHINE) 10 MG tablet Take 10 mg by mouth 2 (two) times daily as needed for severe pain.     [provider]  Misc Natural Products (COLON-AID PO) Take 1 tablet by mouth 3 (three) times daily.     [provider]  Multiple Vitamins-Minerals (MULTIVITAMIN ADULT PO) Take 2 tablets by mouth daily.     [provider]  Nutritional Supplements (GLUCOSE MANAGEMENT) TABS Take 1-2 tablets by mouth as needed (low blood sugar).    [provider]  potassium chloride (K-DUR,KLOR-CON) 10 MEQ tablet Take 20 mEq by mouth 2 (two) times daily.     [provider]  Probiotic Product (PROBIOTIC PO) Take 1 capsule by mouth 3 (three) times daily. Bio X 4    [provider]  Sennosides (SENOKOT PO) Take 2 tablets by mouth daily.     [provider]  sennosides-docusate sodium (SENOKOT-S) 8.6-50 MG tablet Take 2 tablets by mouth daily.     [provider]  valACYclovir (VALTREX) 500 MG tablet Take 500 mg 2 (two) times daily by mouth.  [provider]      Allergies    Flagyl [metronidazole], Morphine, Nsaids, Other, and Sulfonamide derivatives    Review of Systems   Review of Systems  Constitutional:  Negative for fever.  HENT:  Negative for facial swelling and trouble swallowing.   Eyes:  Negative for photophobia.  Respiratory:  Negative for wheezing and stridor.   Cardiovascular:  Negative for chest pain.  Gastrointestinal:  Negative for abdominal pain.  Neurological:  Positive for light-headedness. Negative for facial asymmetry, speech difficulty, weakness and numbness.  All other systems reviewed and are negative.  Physical Exam Updated  Vital Signs BP 140/71 (BP Location: Right Arm)   Pulse 71   Temp 98.3 F (36.8 C) (Oral)   Resp 18   Ht '5\' 8"'$  (1.727 m)   Wt (!) 145.2 kg   SpO2 98%   BMI 48.67 kg/m  Physical Exam Vitals and nursing note reviewed.  Constitutional:      General: She is not in acute distress.    Appearance: Normal appearance. She is well-developed.  HENT:     Head: Normocephalic and atraumatic.     Nose: Nose normal.     Mouth/Throat:     Mouth: Mucous membranes are moist.     Pharynx: No oropharyngeal exudate.  Eyes:     Extraocular Movements: Extraocular movements intact.     Conjunctiva/sclera: Conjunctivae normal.     Pupils: Pupils are equal, round, and reactive to light.     Comments: Normal appearance  Cardiovascular:     Rate and Rhythm: Normal rate and regular rhythm.     Pulses: Normal pulses.     Heart sounds: Normal heart sounds.  Pulmonary:     Effort: Pulmonary effort is normal. No respiratory distress.     Breath sounds: Normal breath sounds.  Abdominal:     General: Bowel sounds are normal. There is no distension.     Palpations: Abdomen is soft. There is no mass.     Tenderness: There is no abdominal tenderness. There is no guarding or rebound.  Genitourinary:    Comments: No CVA tenderness Musculoskeletal:        General: Normal range of motion.     Cervical back: Normal range of motion.  Skin:    General: Skin is warm and dry.     Capillary Refill: Capillary refill takes less than 2 seconds.     Findings: No rash.  Neurological:     General: No focal deficit present.     Mental Status: She is alert and oriented to person, place, and time.     Cranial Nerves: No cranial nerve deficit.     Deep Tendon Reflexes: Reflexes normal.  Psychiatric:        Mood and Affect: Mood normal.        Behavior: Behavior normal.    ED Results / Procedures / Treatments   Labs (all labs ordered are listed, but only abnormal results are displayed) Results for orders placed or  performed during the hospital encounter of 05/10/22  CBC with Differential  Result Value Ref Range   WBC 9.6 4.0 - 10.5 K/uL   RBC 5.21 (H) 3.87 - 5.11 MIL/uL   Hemoglobin 13.6 12.0 - 15.0 g/dL   HCT 43.8 36.0 - 46.0 %   MCV 84.1 80.0 - 100.0 fL   MCH 26.1 26.0 - 34.0 pg   MCHC 31.1 30.0 - 36.0 g/dL   RDW 15.3 11.5 - 15.5 %  Platelets 290 150 - 400 K/uL   nRBC 0.0 0.0 - 0.2 %   Neutrophils Relative % 79 %   Neutro Abs 7.5 1.7 - 7.7 K/uL   Lymphocytes Relative 15 %   Lymphs Abs 1.5 0.7 - 4.0 K/uL   Monocytes Relative 6 %   Monocytes Absolute 0.6 0.1 - 1.0 K/uL   Eosinophils Relative 0 %   Eosinophils Absolute 0.0 0.0 - 0.5 K/uL   Basophils Relative 0 %   Basophils Absolute 0.0 0.0 - 0.1 K/uL   Immature Granulocytes 0 %   Abs Immature Granulocytes 0.04 0.00 - 0.07 K/uL  Basic metabolic panel  Result Value Ref Range   Sodium 140 135 - 145 mmol/L   Potassium 4.5 3.5 - 5.1 mmol/L   Chloride 103 98 - 111 mmol/L   CO2 28 22 - 32 mmol/L   Glucose, Bld 145 (H) 70 - 99 mg/dL   BUN 8 8 - 23 mg/dL   Creatinine, Ser 0.98 0.44 - 1.00 mg/dL   Calcium 9.0 8.9 - 10.3 mg/dL   GFR, Estimated >60 >60 mL/min   Anion gap 9 5 - 15  Urinalysis, Routine w reflex microscopic Urine, Clean Catch  Result Value Ref Range   Color, Urine YELLOW YELLOW   APPearance CLEAR CLEAR   Specific Gravity, Urine 1.010 1.005 - 1.030   pH 5.5 5.0 - 8.0   Glucose, UA NEGATIVE NEGATIVE mg/dL   Hgb urine dipstick NEGATIVE NEGATIVE   Bilirubin Urine NEGATIVE NEGATIVE   Ketones, ur NEGATIVE NEGATIVE mg/dL   Protein, ur NEGATIVE NEGATIVE mg/dL   Nitrite NEGATIVE NEGATIVE   Leukocytes,Ua NEGATIVE NEGATIVE   No results found.  Radiology No results found.  Procedures Procedures    Medications Ordered in ED Medications - No data to display  ED Course/ Medical Decision Making/ A&P                           Medical Decision Making Took CBD gummy and then felt weird and came in for evaluation   Amount  and/or Complexity of Data Reviewed Independent Historian: spouse    Details: see above External Data Reviewed: notes.    Details: previous notes Labs: ordered.    Details: urine is negative for UTI. Chemistry panel is normal.  Normal sodium 140 and potassium 4.5, creatinine is normal .98.  normal white count 9.6 normal hemoglobine 13.6  Risk Risk Details: Feeling better with time.  Do not take more CBD gummies.  Stable for discharge with close follow up.      Final Clinical Impression(s) / ED Diagnoses Final diagnoses:  Adverse effect of drug, initial encounter   Return for intractable cough, coughing up blood, fevers > 100.4 unrelieved by medication, shortness of breath, intractable vomiting, chest pain, shortness of breath, weakness, numbness, changes in speech, facial asymmetry, abdominal pain, passing out, Inability to tolerate liquids or food, cough, altered mental status or any concerns. No signs of systemic illness or infection. The patient is nontoxic-appearing on exam and vital signs are within normal limits.  I have reviewed the triage vital signs and the nursing notes. Pertinent labs & imaging results that were available during my care of the patient were reviewed by me and considered in my medical decision making (see chart for details). After history, exam, and medical workup I feel the patient has been appropriately medically screened and is safe for discharge home. Pertinent diagnoses were discussed with the patient. Patient  was given return precautions.    Rx / DC Orders ED Discharge Orders     None         Shekera Beavers, MD 05/10/22 [phone]

## 2022-10-05 NOTE — H&P (Signed)
TOTAL HIP ADMISSION H&P  Patient is admitted for right total hip arthroplasty.  Subjective:  Chief Complaint: Right hip pain  HPI: Crystal [name], 61 y.o. female, has a history of pain and functional disability in the right hip due to arthritis and patient has failed non-surgical conservative treatments for greater than 12 weeks to include NSAID's and/or analgesics, use of assistive devices, weight reduction as appropriate, and activity modification. Onset of symptoms was gradual, starting  several  years ago with gradually worsening course since that time. The patient noted no past surgery on the right hip. Patient currently rates pain in the right hip at 8 out of 10 with activity. Patient has night pain, worsening of pain with activity and weight bearing, pain that interfers with activities of daily living, and pain with passive range of motion. Patient has evidence of  bone-on-bone arthritis, right worse than left, with massive osteophyte formation throughout the acetabulum and inferior femoral neck  by imaging studies. This condition presents safety issues increasing the risk of falls. There is no current active infection.  Patient Active Problem List   Diagnosis Date Noted   Hemorrhage of rectum and anus 08/07/2019   Meralgia paraesthetica, right 10/17/2017   DM (diabetes mellitus) ([place]) 11/27/2012   IBS (irritable bowel syndrome) 11/27/2012   Dysesthesia of lower extremities 11/27/2012   Fibromyalgia 11/27/2012   Hypothyroidism 11/27/2012   ALLERGY, FOOD 12/13/2007   MORBID OBESITY 11/02/2007   ANXIETY 11/02/2007   VOCAL CORD DISORDER 11/02/2007   ASTHMA 11/02/2007   ESOPHAGEAL REFLUX 11/02/2007   CHEST PAIN, ATYPICAL 11/02/2007    Past Medical History:  Diagnosis Date   Asthma    Activity induced; improved , reports 08-06-2019 breathing now stable per patient report    Chronic fatigue syndrome    Chronic myofascial pain 1997   Dr Jason Fila   Complication of anesthesia     pseudocolonesterase deficiency   DDD (degenerative disc disease), lumbar 09323   Dr Jenny Reichmann Rendall   Diabetes mellitus    type 2 , checks daily before breakfast     Endometriosis    Endometriosis    Family history of adverse reaction to anesthesia    Fibromyalgia 1997   Dr Jason Fila   Hypothyroidism    IBS (irritable bowel syndrome)    Interstitial and deep keratitis    Interstitial cystitis    Dr Alona Bene   Lumbago    Morbid obesity ([place])    Osteoarthritis of lumbar spine 2018   Dr Wandra Feinstein   Peripheral neuropathy    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[place])    hadnt had sx of this in over a year    Reflex sympathetic dystrophy    Reflex sympathetic dystrophy of right lower extremity 2002   Right toe- Dr Jason Fila;  "hasnt bothered me in a few years"    Seizures ([place])    last seizure was about 8 months ago, unsure what triggered it , say she was hospitalized at [facility] but encounter not seen in epic , does not take any seizure medication at this time , denies reoccurrence of seizure episodes since that time    Sleep apnea    no device at this time   Thyroid disease    Wears eyeglasses    Wheelchair dependent     Past Surgical History:  Procedure Laterality Date   [place]- Dr Kendall Flack   ABDOMINAL SURGERY     BLADDER SURGERY  1997   Bladder distention, [place] (1998, 2003, 2004, 2006, 2007, 2008, 2009, 2012, 2013)( [facility]-1999, 2001, 2002) [facility] (2015, 2017,)   BUNIONECTOMY Right 2001   [facility]- Dr Peter Raymond  "some year ago i cant remember"    reports she was having chest pain at the time but says cath was clean     CHOLECYSTECTOMY  1987   [facility], Dr Werner Lean   COLONOSCOPY WITH PROPOFOL N/A 08/07/2019   Procedure: COLONOSCOPY WITH PROPOFOL;  Surgeon: Wilford Corner, MD;  Location: [facility];  Service: Endoscopy;  Laterality: N/A;   GYNECOLOGIC CRYOSURGERY  1983   Dr Oleh Genin office   JOINT  REPLACEMENT     denies    KNEE ARTHROSCOPY Left 1991   [facility]- Dr Oretha Caprice   KNEE SURGERY     denies    POLYPECTOMY  08/07/2019   Procedure: POLYPECTOMY;  Surgeon: Wilford Corner, MD;  Location: [facility];  Service: Endoscopy;;   PUBOVAGINAL [facility] SYNTHETIC  1997   [facility]- Dr Alona Bene   TUBAL LIGATION  1990   [facility]- Dr Kendall Flack    Prior to Admission medications   Medication Sig Start Date End Date Taking? Authorizing Provider  acetaminophen (TYLENOL) 650 MG CR tablet Take 1,300 mg by mouth daily.     [provider]  albuterol (PROVENTIL HFA;VENTOLIN HFA) 108 (90 BASE) MCG/ACT inhaler Inhale 2 puffs into the lungs every 6 (six) hours as needed for shortness of breath.     [provider]  baclofen (LIORESAL) 20 MG tablet Take 20 mg by mouth 3 (three) times daily.     [provider]  Cholecalciferol (VITAMIN D3) 50 MCG (2000 UT) capsule Take 2,000 Units by mouth daily.     [provider]  EPINEPHrine (EPI-PEN) 0.3 mg/0.3 mL DEVI Inject 0.3 mg into the muscle once as needed (anaphylaxis).     [provider]  fexofenadine (ALLEGRA) 180 MG tablet Take 180 mg by mouth daily.    [provider]  glipiZIDE (GLUCOTROL) 10 MG tablet Take 10 mg by mouth 2 (two) times daily before a meal.    [provider]  ipratropium-albuterol (DUONEB) 0.5-2.5 (3) MG/3ML SOLN Take 3 mLs by nebulization every 4 (four) hours as needed (asthma).     [provider]  levothyroxine (SYNTHROID, LEVOTHROID) 50 MCG tablet Take 50 mcg by mouth daily before breakfast.     [provider]  lubiprostone (AMITIZA) 24 MCG capsule Take 24 mcg by mouth 2 (two) times daily with a meal.      [provider]  methadone (DOLOPHINE) 10 MG tablet Take 10 mg by mouth 2 (two) times daily as needed for severe pain.     [provider]  Misc Natural Products (COLON-AID PO) Take 1 tablet by mouth 3 (three) times daily.      [provider]  Multiple Vitamins-Minerals (MULTIVITAMIN ADULT PO) Take 2 tablets by mouth daily.     [provider]  Nutritional Supplements (GLUCOSE MANAGEMENT) TABS Take 1-2 tablets by mouth as needed (low blood sugar).    [provider]  potassium chloride (K-DUR,KLOR-CON) 10 MEQ tablet Take 20 mEq by mouth 2 (two) times daily.     [provider]  Probiotic Product (PROBIOTIC PO) Take 1 capsule by mouth 3 (three) times daily. Bio X 4    [provider]  Sennosides (SENOKOT PO) Take 2 tablets by mouth daily.     [provider]  sennosides-docusate sodium (SENOKOT-S) 8.6-50 MG tablet Take 2 tablets by mouth daily.     [provider]  valACYclovir (VALTREX) 500 MG tablet Take 500 mg 2 (two) times daily by mouth.    [provider]    Allergies  Allergen Reactions   Flagyl [Metronidazole] Itching and Swelling   Morphine Nausea And Vomiting   Nsaids Nausea Only    Headaches   Other     Metals: Nickel, cat dander, dog dander, hickery, ash, walnut, lambs quarter, dust, ragweed, dustmites, difar, sawdust, tar   Sulfonamide Derivatives Itching and Rash    Blisters in throat     Social History   Socioeconomic History   Marital status: Married    Spouse name: Not on file   Number of children: Not on file   Years of education: Not on file   Highest education level: Not on file  Occupational History   Not on file  Tobacco Use   Smoking status: Never   Smokeless tobacco: Never  Substance and Sexual Activity   Alcohol use: No   Drug use: Never   Sexual activity: Yes    Birth control/protection: Surgical  Other Topics Concern   Not on file  Social History Narrative   Lives   Caffeine use:    Right handed    Social Determinants of Health   Financial Resource Strain: Not on file  Food Insecurity: Not on file  Transportation Needs: Not on file  Physical Activity: Not on file  Stress: Not on file   Social Connections: Not on file  Intimate Partner Violence: Not on file    Tobacco Use: Low Risk  (12/23/2020)   Patient History    Smoking Tobacco Use: Never    Smokeless Tobacco Use: Never    Passive Exposure: Not on file   Social History   Substance and Sexual Activity  Alcohol Use No    No family history on file.  Review of Systems  Constitutional:  Negative for chills and fever.  HENT:  Negative for congestion, sore throat and tinnitus.   Eyes:  Negative for double vision, photophobia and pain.  Respiratory:  Negative for cough, shortness of breath and wheezing.   Cardiovascular:  Negative for chest pain, palpitations and orthopnea.  Gastrointestinal:  Negative for heartburn, nausea and vomiting.  Genitourinary:  Negative for dysuria, frequency and urgency.  Musculoskeletal:  Positive for joint pain.  Neurological:  Negative for dizziness, weakness and headaches.     Objective:  Physical Exam: Well nourished and well developed.  General: Alert and oriented x3, cooperative and pleasant, no acute distress.  Head: normocephalic, atraumatic, neck supple.  Eyes: EOMI.  Musculoskeletal:  Right Hip Exam: The range of motion: Flexion to 100 degrees, Internal Rotation is minimal, External Rotation to 30 degrees, and abduction to 30 degrees without discomfort. There is no tenderness over the greater trochanteric bursa.  Calves soft and nontender. Motor function intact in LE. Strength 5/5 LE bilaterally. Neuro: Distal pulses 2+. Sensation to light touch intact in LE.  Imaging Review Plain radiographs demonstrate severe degenerative joint disease of the right hip. The bone quality appears to be adequate for age and reported activity level.  Assessment/Plan:  End stage arthritis, right hip  The patient history, physical examination, clinical judgement of the provider and imaging studies are consistent with end stage degenerative joint disease of the right hip and total  hip arthroplasty is deemed medically necessary. The treatment options including medical management,  injection therapy, arthroscopy and arthroplasty were discussed at length. The risks and benefits of total hip arthroplasty were presented and reviewed. The risks due to aseptic loosening, infection, stiffness, dislocation/subluxation, thromboembolic complications and other imponderables were discussed. The patient acknowledged the explanation, agreed to proceed with the plan and consent was signed. Patient is being admitted for inpatient treatment for surgery, pain control, PT, OT, prophylactic antibiotics, VTE prophylaxis, progressive ambulation and ADLs and discharge planning.The patient is planning to be discharged  home .   Patient's anticipated LOS is less than 2 midnights, meeting these requirements: - Younger than 22 - Lives within 1 hour of care - Has a competent adult at home to recover with post-op recover - NO history of  - Diabetes  - Coronary Artery Disease  - Heart failure  - Heart attack  - Stroke  - DVT/VTE  - Cardiac arrhythmia  - Respiratory Failure/COPD  - Renal failure  - Anemia  - Advanced Liver disease  Therapy Plans: HEP Disposition: Home with husband Planned DVT Prophylaxis: Xarelto 10 mg QD DME Needed: None PCP: Kevan Ny, MD (clearance received) TXA: IV Allergies: NSAIDs (headache, N/V, hallucinations), sulfa, morphine, amoxicillin (yeast infection) Anesthesia Concerns:  BMI: 41 Last HgbA1c: Not diabetic.  Pharmacy: [facility])  Other: - Needs to achieve weight of 258 in order to be <40. Weight check on 11/14 - Family history of thromboembolic disease - Xarelto QD - Methadone 10 mg, prescribed TID but usually takes BID - Discussed continuing baclofen following surgery   - Patient was instructed on what medications to stop prior to surgery. - Follow-up visit in 2 weeks with Dr. Wynelle Link - Begin physical therapy following surgery -  Pre-operative lab work as pre-surgical testing - Prescriptions will be provided in hospital at time of discharge  Theresa Duty, PA-C Orthopedic Surgery [facility] [place]

## 2022-10-20 NOTE — Patient Instructions (Signed)
SURGICAL WAITING ROOM VISITATION Patients having surgery or a procedure may have no more than 2 support people in the waiting area - these visitors may rotate.   Children under the age of 43 must have an adult with them who is not the patient. If the patient needs to stay at the hospital during part of their recovery, the visitor guidelines for inpatient rooms apply. Pre-op nurse will coordinate an appropriate time for 1 support person to accompany patient in pre-op.  This support person may not rotate.    Please refer to the [facility] website for the visitor guidelines for Inpatients (after your surgery is over and you are in a regular room).    Your procedure is scheduled on: 10/27/22   Report to [facility] Main Entrance    Report to admitting at 6:00 AM   Call this number if you have problems the morning of surgery [phone]   Do not eat food :After Midnight.   After Midnight you may have the following liquids until 5:30 AM DAY OF SURGERY  Water Non-Citrus Juices (without pulp, NO RED) Carbonated Beverages Black Coffee (NO MILK/CREAM OR CREAMERS, sugar ok)  Clear Tea (NO MILK/CREAM OR CREAMERS, sugar ok) regular and decaf                             Plain Jell-O (NO RED)                                           Fruit ices (not with fruit pulp, NO RED)                                     Popsicles (NO RED)                                                               Sports drinks like Gatorade (NO RED)                 The day of surgery:  Drink ONE (1) Pre-Surgery G2 at 5:30 AM the morning of surgery. Drink in one sitting. Do not sip.  This drink was given to you during your hospital  pre-op appointment visit. Nothing else to drink after completing the  Pre-Surgery G2.          If you have questions, please contact your surgeon's office.   FOLLOW BOWEL PREP AND ANY ADDITIONAL PRE OP INSTRUCTIONS YOU RECEIVED FROM YOUR SURGEON'S OFFICE!!!     Oral Hygiene  is also important to reduce your risk of infection.                                    Remember - BRUSH YOUR TEETH THE MORNING OF SURGERY WITH YOUR REGULAR TOOTHPASTE   Take these medicines the morning of surgery with A SIP OF WATER: Tylenol, Inhalers, Allegra, Nebulizer, Synthroid, Omeprazole   How to Manage Your Diabetes Before and After Surgery  Why is it important  to control my blood sugar before and after surgery? Improving blood sugar levels before and after surgery helps healing and can limit problems. A way of improving blood sugar control is eating a healthy diet by:  Eating less sugar and carbohydrates  Increasing activity/exercise  Talking with your doctor about reaching your blood sugar goals High blood sugars (greater than 180 mg/dL) can raise your risk of infections and slow your recovery, so you will need to focus on controlling your diabetes during the weeks before surgery. Make sure that the doctor who takes care of your diabetes knows about your planned surgery including the date and location.  How do I manage my blood sugar before surgery? Check your blood sugar at least 4 times a day, starting 2 days before surgery, to make sure that the level is not too high or low. Check your blood sugar the morning of your surgery when you wake up and every 2 hours until you get to the Short Stay unit. If your blood sugar is less than 70 mg/dL, you will need to treat for low blood sugar: Do not take insulin. Treat a low blood sugar (less than 70 mg/dL) with  cup of clear juice (cranberry or apple), 4 glucose tablets, OR glucose gel. Recheck blood sugar in 15 minutes after treatment (to make sure it is greater than 70 mg/dL). If your blood sugar is not greater than 70 mg/dL on recheck, call [phone] for further instructions. Report your blood sugar to the short stay nurse when you get to Short Stay.  If you are admitted to the hospital after surgery: Your blood sugar will be  checked by the staff and you will probably be given insulin after surgery (instead of oral diabetes medicines) to make sure you have good blood sugar levels. The goal for blood sugar control after surgery is 80-180 mg/dL.   Reviewed and Endorsed by [facility] Patient Education Committee, August 2015                              You may not have any metal on your body including hair pins, jewelry, and body piercing             Do not wear make-up, lotions, powders, perfumes, or deodorant  Do not wear nail polish including gel and S&S, artificial/acrylic nails, or any other type of covering on natural nails including finger and toenails. If you have artificial nails, gel coating, etc. that needs to be removed by a nail salon please have this removed prior to surgery or surgery may need to be canceled/ delayed if the surgeon/ anesthesia feels like they are unable to be safely monitored.   Do not shave  48 hours prior to surgery.    Do not bring valuables to the hospital. [place].   Bring small overnight bag day of surgery.   DO NOT [place]. PHARMACY WILL DISPENSE MEDICATIONS LISTED ON YOUR MEDICATION LIST TO YOU DURING YOUR ADMISSION [place]!   Special Instructions: Bring a copy of your healthcare power of attorney and living will documents the day of surgery if you haven't scanned them before.              Please read over the following fact sheets you were given: IF YOU  HAVE QUESTIONS ABOUT YOUR PRE-OP INSTRUCTIONS PLEASE CALL [phone] Schneiders   If you received a COVID test during your pre-op visit  it is requested that you wear a mask when out in public, stay away from anyone that may not be feeling well and notify your surgeon if you develop symptoms. If you test positive for Covid or have been in contact with anyone that has tested positive in the last 10 days please notify you  surgeon.    [place] - Preparing for Surgery Before surgery, you can play an important role.  Because skin is not sterile, your skin needs to be as free of germs as possible.  You can reduce the number of germs on your skin by washing with CHG (chlorahexidine gluconate) soap before surgery.  CHG is an antiseptic cleaner which kills germs and bonds with the skin to continue killing germs even after washing. Please DO NOT use if you have an allergy to CHG or antibacterial soaps.  If your skin becomes reddened/irritated stop using the CHG and inform your nurse when you arrive at Short Stay. Do not shave (including legs and underarms) for at least 48 hours prior to the first CHG shower.  You may shave your face/neck.  Please follow these instructions carefully:  1.  Shower with CHG Soap the night before surgery and the  morning of surgery.  2.  If you choose to wash your hair, wash your hair first as usual with your normal  shampoo.  3.  After you shampoo, rinse your hair and body thoroughly to remove the shampoo.                             4.  Use CHG as you would any other liquid soap.  You can apply chg directly to the skin and wash.  Gently with a scrungie or clean washcloth.  5.  Apply the CHG Soap to your body ONLY FROM THE NECK DOWN.   Do   not use on face/ open                           Wound or open sores. Avoid contact with eyes, ears mouth and   genitals (private parts).                       Wash face,  Genitals (private parts) with your normal soap.             6.  Wash thoroughly, paying special attention to the area where your    surgery  will be performed.  7.  Thoroughly rinse your body with warm water from the neck down.  8.  DO NOT shower/wash with your normal soap after using and rinsing off the CHG Soap.                9.  Pat yourself dry with a clean towel.            10.  Wear clean pajamas.            11.  Place clean sheets on your bed the night of your first shower and  do not  sleep with pets. Day of Surgery : Do not apply any lotions/deodorants the morning of surgery.  Please wear clean clothes to the hospital/surgery center.  FAILURE TO FOLLOW THESE INSTRUCTIONS MAY RESULT IN THE CANCELLATION  OF YOUR SURGERY  PATIENT SIGNATURE_________________________________  NURSE SIGNATURE__________________________________  ________________________________________________________________________  Adam Phenix  An incentive spirometer is a tool that can help keep your lungs clear and active. This tool measures how well you are filling your lungs with each breath. Taking long deep breaths may help reverse or decrease the chance of developing breathing (pulmonary) problems (especially infection) following: A long period of time when you are unable to move or be active. BEFORE THE PROCEDURE  If the spirometer includes an indicator to show your best effort, your nurse or respiratory therapist will set it to a desired goal. If possible, sit up straight or lean slightly forward. Try not to slouch. Hold the incentive spirometer in an upright position. INSTRUCTIONS FOR USE  Sit on the edge of your bed if possible, or sit up as far as you can in bed or on a chair. Hold the incentive spirometer in an upright position. Breathe out normally. Place the mouthpiece in your mouth and seal your lips tightly around it. Breathe in slowly and as deeply as possible, raising the piston or the ball toward the top of the column. Hold your breath for 3-5 seconds or for as long as possible. Allow the piston or ball to fall to the bottom of the column. Remove the mouthpiece from your mouth and breathe out normally. Rest for a few seconds and repeat Steps 1 through 7 at least 10 times every 1-2 hours when you are awake. Take your time and take a few normal breaths between deep breaths. The spirometer may include an indicator to show your best effort. Use the indicator as a goal to  work toward during each repetition. After each set of 10 deep breaths, practice coughing to be sure your lungs are clear. If you have an incision (the cut made at the time of surgery), support your incision when coughing by placing a pillow or rolled up towels firmly against it. Once you are able to get out of bed, walk around indoors and cough well. You may stop using the incentive spirometer when instructed by your caregiver.  RISKS AND COMPLICATIONS Take your time so you do not get dizzy or light-headed. If you are in pain, you may need to take or ask for pain medication before doing incentive spirometry. It is harder to take a deep breath if you are having pain. AFTER USE Rest and breathe slowly and easily. It can be helpful to keep track of a log of your progress. Your caregiver can provide you with a simple table to help with this. If you are using the spirometer at home, follow these instructions: [place] IF:  You are having difficultly using the spirometer. You have trouble using the spirometer as often as instructed. Your pain medication is not giving enough relief while using the spirometer. You develop fever of 100.5 F (38.1 C) or higher. SEEK IMMEDIATE MEDICAL CARE IF:  You cough up bloody sputum that had not been present before. You develop fever of 102 F (38.9 C) or greater. You develop worsening pain at or near the incision site. MAKE SURE YOU:  Understand these instructions. Will watch your condition. Will get help right away if you are not doing well or get worse. Document Released: 04/11/2007 Document Revised: 02/21/2012 Document Reviewed: 06/12/2007 [employer] Patient Information 2014 [employer], [place].   ________________________________________________________________________ WHAT IS A BLOOD TRANSFUSION? Blood Transfusion Information  A transfusion is the replacement of blood or some of its parts. Blood is made  up of multiple cells which provide different  functions. Red blood cells carry oxygen and are used for blood loss replacement. White blood cells fight against infection. Platelets control bleeding. Plasma helps clot blood. Other blood products are available for specialized needs, such as hemophilia or other clotting disorders. BEFORE THE TRANSFUSION  Who gives blood for transfusions?  Healthy volunteers who are fully evaluated to make sure their blood is safe. This is blood bank blood. Transfusion therapy is the safest it has ever been in the practice of medicine. Before blood is taken from a donor, a complete history is taken to make sure that person has no history of diseases nor engages in risky social behavior (examples are intravenous drug use or sexual activity with multiple partners). The donor's travel history is screened to minimize risk of transmitting infections, such as malaria. The donated blood is tested for signs of infectious diseases, such as HIV and hepatitis. The blood is then tested to be sure it is compatible with you in order to minimize the chance of a transfusion reaction. If you or a relative donates blood, this is often done in anticipation of surgery and is not appropriate for emergency situations. It takes many days to process the donated blood. RISKS AND COMPLICATIONS Although transfusion therapy is very safe and saves many lives, the main dangers of transfusion include:  Getting an infectious disease. Developing a transfusion reaction. This is an allergic reaction to something in the blood you were given. Every precaution is taken to prevent this. The decision to have a blood transfusion has been considered carefully by your caregiver before blood is given. Blood is not given unless the benefits outweigh the risks. AFTER THE TRANSFUSION Right after receiving a blood transfusion, you will usually feel much better and more energetic. This is especially true if your red blood cells have gotten low (anemic). The  transfusion raises the level of the red blood cells which carry oxygen, and this usually causes an energy increase. The nurse administering the transfusion will monitor you carefully for complications. HOME CARE INSTRUCTIONS  No special instructions are needed after a transfusion. You may find your energy is better. Speak with your caregiver about any limitations on activity for underlying diseases you may have. SEEK MEDICAL CARE IF:  Your condition is not improving after your transfusion. You develop redness or irritation at the intravenous (IV) site. SEEK IMMEDIATE MEDICAL CARE IF:  Any of the following symptoms occur over the next 12 hours: Shaking chills. You have a temperature by mouth above 102 F (38.9 C), not controlled by medicine. Chest, back, or muscle pain. People around you feel you are not acting correctly or are confused. Shortness of breath or difficulty breathing. Dizziness and fainting. You get a rash or develop hives. You have a decrease in urine output. Your urine turns a dark color or changes to pink, red, or brown. Any of the following symptoms occur over the next 10 days: You have a temperature by mouth above 102 F (38.9 C), not controlled by medicine. Shortness of breath. Weakness after normal activity. The white part of the eye turns yellow (jaundice). You have a decrease in the amount of urine or are urinating less often. Your urine turns a dark color or changes to pink, red, or brown. Document Released: 11/26/2000 Document Revised: 02/21/2012 Document Reviewed: 07/15/2008 [facility] Patient Information 2014 [place], [place].  _______________________________________________________________________

## 2022-10-20 NOTE — Progress Notes (Addendum)
COVID Vaccine Completed:  Date of COVID positive in last 90 days:  PCP - Kevan Ny, MD Cardiologist -   Chest x-ray -  EKG -  Stress Test - 2009 ECHO -  Cardiac Cath -  Pacemaker/ICD device last checked: Spinal Cord Stimulator:  Bowel Prep -   Sleep Study -  CPAP -   Fasting Blood Sugar -  Checks Blood Sugar _____ times a day  Last dose of GLP1 agonist-  N/A GLP1 instructions:  N/A   Last dose of SGLT-2 inhibitors-  N/A SGLT-2 instructions: N/A   Blood Thinner Instructions: Aspirin Instructions: Last Dose:  Activity level:  Can go up a flight of stairs and perform activities of daily living without stopping and without symptoms of chest pain or shortness of breath.  Able to exercise without symptoms  Unable to go up a flight of stairs without symptoms of     Anesthesia review:   Patient denies shortness of breath, fever, cough and chest pain at PAT appointment  Patient verbalized understanding of instructions that were given to them at the PAT appointment. Patient was also instructed that they will need to review over the PAT instructions again at home before surgery.

## 2022-10-21 ENCOUNTER — Encounter (HOSPITAL_COMMUNITY): Payer: Self-pay

## 2022-10-21 ENCOUNTER — Encounter (HOSPITAL_COMMUNITY)
Admission: RE | Admit: 2022-10-21 | Discharge: 2022-10-21 | Disposition: A | Discharge status: Home / Self Care | Payer: Medicare Other | Source: Ambulatory Visit | Attending: Orthopedic Surgery | Admitting: Orthopedic Surgery

## 2022-10-21 VITALS — BP 146/67 | HR 62 | Temp 98.1°F | Resp 12 | Ht 68.0 in | Wt 262.0 lb

## 2022-10-21 DIAGNOSIS — E119 Type 2 diabetes mellitus without complications: Secondary | ICD-10-CM | POA: Insufficient documentation

## 2022-10-21 DIAGNOSIS — Z01818 Encounter for other preprocedural examination: Secondary | ICD-10-CM | POA: Diagnosis not present

## 2022-10-21 LAB — BASIC METABOLIC PANEL
Anion gap: 4 — ABNORMAL LOW (ref 5–15)
BUN: 8 mg/dL (ref 8–23)
CO2: 30 mmol/L (ref 22–32)
Calcium: 9 mg/dL (ref 8.9–10.3)
Chloride: 107 mmol/L (ref 98–111)
Creatinine, Ser: 0.92 mg/dL (ref 0.44–1.00)
GFR, Estimated: >60 mL/min (ref >60)
Glucose, Bld: 81 mg/dL (ref 70–99)
Potassium: 4.8 mmol/L (ref 3.5–5.1)
Sodium: 141 mmol/L (ref 135–145)

## 2022-10-21 LAB — CBC
HCT: 47 % — ABNORMAL HIGH (ref 36.0–46.0)
Hemoglobin: 14.5 g/dL (ref 12.0–15.0)
MCH: 26.4 pg (ref 26.0–34.0)
MCHC: 30.9 g/dL (ref 30.0–36.0)
MCV: 85.6 fL (ref 80.0–100.0)
Platelets: 307 10*3/uL (ref 150–400)
RBC: 5.49 MIL/uL — ABNORMAL HIGH (ref 3.87–5.11)
RDW: 14.9 % (ref 11.5–15.5)
WBC: 7.5 10*3/uL (ref 4.0–10.5)
nRBC: 0 % (ref 0.0–0.2)

## 2022-10-21 LAB — SURGICAL PCR SCREEN
MRSA, PCR: NEGATIVE
Staphylococcus aureus: NEGATIVE

## 2022-10-21 LAB — HEMOGLOBIN A1C
Hgb A1c MFr Bld: 5.4 % (ref 4.8–5.6)
Mean Plasma Glucose: 108.28 mg/dL

## 2022-10-21 LAB — GLUCOSE, CAPILLARY: Glucose-Capillary: 84 mg/dL (ref 70–99)

## 2022-10-27 ENCOUNTER — Observation Stay (HOSPITAL_COMMUNITY): Payer: Medicare Other

## 2022-10-27 ENCOUNTER — Ambulatory Visit (HOSPITAL_COMMUNITY): Payer: Medicare Other | Admitting: Physician Assistant

## 2022-10-27 ENCOUNTER — Encounter (HOSPITAL_COMMUNITY): Payer: Self-pay | Admitting: Orthopedic Surgery

## 2022-10-27 ENCOUNTER — Other Ambulatory Visit: Payer: Self-pay

## 2022-10-27 ENCOUNTER — Ambulatory Visit (HOSPITAL_COMMUNITY): Payer: Medicare Other

## 2022-10-27 ENCOUNTER — Ambulatory Visit (HOSPITAL_BASED_OUTPATIENT_CLINIC_OR_DEPARTMENT_OTHER): Payer: Medicare Other | Admitting: Certified Registered Nurse Anesthetist

## 2022-10-27 ENCOUNTER — Encounter (HOSPITAL_COMMUNITY)
Admission: AD | Disposition: A | Discharge status: Home / Self Care | Payer: Self-pay | Source: Home / Self Care | Attending: Orthopedic Surgery

## 2022-10-27 ENCOUNTER — Inpatient Hospital Stay (HOSPITAL_COMMUNITY)
Admission: AD | Admit: 2022-10-27 | Discharge: 2022-10-30 | DRG: 470 | Disposition: A | Discharge status: Home / Self Care | Payer: Medicare Other | Attending: Orthopedic Surgery | Admitting: Orthopedic Surgery

## 2022-10-27 DIAGNOSIS — G4730 Sleep apnea, unspecified: Secondary | ICD-10-CM | POA: Diagnosis present

## 2022-10-27 DIAGNOSIS — M47816 Spondylosis without myelopathy or radiculopathy, lumbar region: Secondary | ICD-10-CM | POA: Diagnosis present

## 2022-10-27 DIAGNOSIS — Z6841 Body Mass Index (BMI) 40.0 and over, adult: Secondary | ICD-10-CM

## 2022-10-27 DIAGNOSIS — I25119 Atherosclerotic heart disease of native coronary artery with unspecified angina pectoris: Secondary | ICD-10-CM | POA: Diagnosis not present

## 2022-10-27 DIAGNOSIS — Z79890 Hormone replacement therapy (postmenopausal): Secondary | ICD-10-CM

## 2022-10-27 DIAGNOSIS — M169 Osteoarthritis of hip, unspecified: Principal | ICD-10-CM | POA: Diagnosis present

## 2022-10-27 DIAGNOSIS — Z90710 Acquired absence of both cervix and uterus: Secondary | ICD-10-CM

## 2022-10-27 DIAGNOSIS — M5136 Other intervertebral disc degeneration, lumbar region: Secondary | ICD-10-CM | POA: Diagnosis present

## 2022-10-27 DIAGNOSIS — Z885 Allergy status to narcotic agent status: Secondary | ICD-10-CM

## 2022-10-27 DIAGNOSIS — E039 Hypothyroidism, unspecified: Secondary | ICD-10-CM | POA: Diagnosis present

## 2022-10-27 DIAGNOSIS — Z91048 Other nonmedicinal substance allergy status: Secondary | ICD-10-CM

## 2022-10-27 DIAGNOSIS — Z886 Allergy status to analgesic agent status: Secondary | ICD-10-CM

## 2022-10-27 DIAGNOSIS — Z993 Dependence on wheelchair: Secondary | ICD-10-CM

## 2022-10-27 DIAGNOSIS — Z882 Allergy status to sulfonamides status: Secondary | ICD-10-CM

## 2022-10-27 DIAGNOSIS — M1611 Unilateral primary osteoarthritis, right hip: Secondary | ICD-10-CM | POA: Diagnosis not present

## 2022-10-27 DIAGNOSIS — Z7984 Long term (current) use of oral hypoglycemic drugs: Secondary | ICD-10-CM

## 2022-10-27 DIAGNOSIS — Z9049 Acquired absence of other specified parts of digestive tract: Secondary | ICD-10-CM

## 2022-10-27 DIAGNOSIS — J45909 Unspecified asthma, uncomplicated: Secondary | ICD-10-CM | POA: Diagnosis not present

## 2022-10-27 DIAGNOSIS — M797 Fibromyalgia: Secondary | ICD-10-CM | POA: Diagnosis present

## 2022-10-27 DIAGNOSIS — E119 Type 2 diabetes mellitus without complications: Secondary | ICD-10-CM | POA: Diagnosis present

## 2022-10-27 HISTORY — PX: TOTAL HIP ARTHROPLASTY: SHX124

## 2022-10-27 LAB — GLUCOSE, CAPILLARY
Glucose-Capillary: 102 mg/dL — ABNORMAL HIGH (ref 70–99)
Glucose-Capillary: 144 mg/dL — ABNORMAL HIGH (ref 70–99)

## 2022-10-27 LAB — ABO/RH: ABO/RH(D): B POS

## 2022-10-27 LAB — TYPE AND SCREEN
ABO/RH(D): B POS
Antibody Screen: NEGATIVE

## 2022-10-27 SURGERY — ARTHROPLASTY, HIP, TOTAL, ANTERIOR APPROACH
Anesthesia: Monitor Anesthesia Care | Site: Hip | Laterality: Right

## 2022-10-27 MED ORDER — BISACODYL 10 MG RE SUPP: 10.0000 mg | Freq: Every day | RECTAL | Status: DC | PRN

## 2022-10-27 MED ORDER — ROCURONIUM BROMIDE 100 MG/10ML IV SOLN
INTRAVENOUS | Status: DC | PRN
  Administered 2022-10-27: 100 mg via INTRAVENOUS

## 2022-10-27 MED ORDER — ACETAMINOPHEN 10 MG/ML IV SOLN: 1000.0000 mg | Freq: Once | INTRAVENOUS | Status: DC | PRN

## 2022-10-27 MED ORDER — ACETAMINOPHEN 325 MG PO TABS
325.0000 mg | ORAL_TABLET | Freq: Four times a day (QID) | ORAL | Status: DC | PRN
  Administered 2022-10-29 – 2022-10-30 (×2): 650 mg via ORAL
  Filled 2022-10-27 (×2): qty 2

## 2022-10-27 MED ORDER — PROPOFOL 10 MG/ML IV BOLUS
INTRAVENOUS | Status: DC | PRN
  Administered 2022-10-27: 170 mg via INTRAVENOUS

## 2022-10-27 MED ORDER — POLYETHYLENE GLYCOL 3350 17 G PO PACK: 17.0000 g | PACK | Freq: Every day | ORAL | Status: DC | PRN

## 2022-10-27 MED ORDER — PHENOL 1.4 % MT LIQD: 1.0000 | OROMUCOSAL | Status: DC | PRN

## 2022-10-27 MED ORDER — METOCLOPRAMIDE HCL 5 MG PO TABS: 5.0000 mg | ORAL_TABLET | Freq: Three times a day (TID) | ORAL | Status: DC | PRN

## 2022-10-27 MED ORDER — METHOCARBAMOL 500 MG PO TABS
500.0000 mg | ORAL_TABLET | Freq: Four times a day (QID) | ORAL | Status: DC | PRN
  Administered 2022-10-27 – 2022-10-30 (×8): 500 mg via ORAL
  Filled 2022-10-27 (×9): qty 1

## 2022-10-27 MED ORDER — HYDROMORPHONE HCL 1 MG/ML IJ SOLN
INTRAMUSCULAR | Status: DC | PRN
  Administered 2022-10-27 (×2): 1 mg via INTRAVENOUS

## 2022-10-27 MED ORDER — MENTHOL 3 MG MT LOZG: 1.0000 | LOZENGE | OROMUCOSAL | Status: DC | PRN

## 2022-10-27 MED ORDER — HYDROMORPHONE HCL 1 MG/ML IJ SOLN
INTRAMUSCULAR | Status: AC
  Administered 2022-10-27: 0.5 mg via INTRAVENOUS
  Filled 2022-10-27: qty 1

## 2022-10-27 MED ORDER — MIDAZOLAM HCL 2 MG/2ML IJ SOLN
INTRAMUSCULAR | Status: AC
  Filled 2022-10-27: qty 2

## 2022-10-27 MED ORDER — ACETAMINOPHEN 10 MG/ML IV SOLN
INTRAVENOUS | Status: AC
  Filled 2022-10-27: qty 100

## 2022-10-27 MED ORDER — KETOROLAC TROMETHAMINE 30 MG/ML IJ SOLN
INTRAMUSCULAR | Status: AC
  Filled 2022-10-27: qty 1

## 2022-10-27 MED ORDER — TRANEXAMIC ACID-NACL 1000-0.7 MG/100ML-% IV SOLN
1000.0000 mg | INTRAVENOUS | Status: AC
  Administered 2022-10-27: 1000 mg via INTRAVENOUS
  Filled 2022-10-27: qty 100

## 2022-10-27 MED ORDER — BUPIVACAINE-EPINEPHRINE (PF) 0.5% -1:200000 IJ SOLN
INTRAMUSCULAR | Status: AC
  Filled 2022-10-27: qty 30

## 2022-10-27 MED ORDER — HYDROMORPHONE HCL 1 MG/ML IJ SOLN
INTRAMUSCULAR | Status: AC
  Filled 2022-10-27: qty 1

## 2022-10-27 MED ORDER — SENNOSIDES-DOCUSATE SODIUM 8.6-50 MG PO TABS
2.0000 | ORAL_TABLET | Freq: Every day | ORAL | Status: DC
  Administered 2022-10-28 – 2022-10-30 (×3): 2 via ORAL
  Filled 2022-10-27 (×3): qty 2

## 2022-10-27 MED ORDER — OXYCODONE HCL 5 MG PO TABS: 5.0000 mg | ORAL_TABLET | Freq: Once | ORAL | Status: DC | PRN

## 2022-10-27 MED ORDER — DEXMEDETOMIDINE HCL IN NACL 80 MCG/20ML IV SOLN
INTRAVENOUS | Status: AC
  Filled 2022-10-27: qty 20

## 2022-10-27 MED ORDER — LACTATED RINGERS IV SOLN: INTRAVENOUS | Status: DC

## 2022-10-27 MED ORDER — POVIDONE-IODINE 10 % EX SWAB
2.0000 | Freq: Once | CUTANEOUS | Status: AC
  Administered 2022-10-27: 2 via TOPICAL

## 2022-10-27 MED ORDER — METHOCARBAMOL 500 MG IVPB - SIMPLE MED
INTRAVENOUS | Status: AC
  Filled 2022-10-27: qty 55

## 2022-10-27 MED ORDER — ALBUTEROL SULFATE HFA 108 (90 BASE) MCG/ACT IN AERS: 2.0000 | INHALATION_SPRAY | Freq: Four times a day (QID) | RESPIRATORY_TRACT | Status: DC | PRN

## 2022-10-27 MED ORDER — DEXAMETHASONE SODIUM PHOSPHATE 10 MG/ML IJ SOLN
8.0000 mg | Freq: Once | INTRAMUSCULAR | Status: AC
  Administered 2022-10-27: 8 mg via INTRAVENOUS

## 2022-10-27 MED ORDER — SUGAMMADEX SODIUM 200 MG/2ML IV SOLN
INTRAVENOUS | Status: DC | PRN
  Administered 2022-10-27: 200 mg via INTRAVENOUS

## 2022-10-27 MED ORDER — ACYCLOVIR 400 MG PO TABS
400.0000 mg | ORAL_TABLET | Freq: Two times a day (BID) | ORAL | Status: DC
  Administered 2022-10-27 – 2022-10-30 (×6): 400 mg via ORAL
  Filled 2022-10-27 (×6): qty 1

## 2022-10-27 MED ORDER — FENTANYL CITRATE (PF) 100 MCG/2ML IJ SOLN
INTRAMUSCULAR | Status: DC | PRN
  Administered 2022-10-27: 100 ug via INTRAVENOUS

## 2022-10-27 MED ORDER — SODIUM CHLORIDE 0.9 % IV SOLN: INTRAVENOUS | Status: DC

## 2022-10-27 MED ORDER — OXYCODONE HCL 5 MG/5ML PO SOLN: 5.0000 mg | Freq: Once | ORAL | Status: DC | PRN

## 2022-10-27 MED ORDER — FENTANYL CITRATE PF 50 MCG/ML IJ SOSY
PREFILLED_SYRINGE | INTRAMUSCULAR | Status: AC
  Filled 2022-10-27: qty 2

## 2022-10-27 MED ORDER — FENTANYL CITRATE PF 50 MCG/ML IJ SOSY
PREFILLED_SYRINGE | INTRAMUSCULAR | Status: AC
  Filled 2022-10-27: qty 1

## 2022-10-27 MED ORDER — HYDROMORPHONE HCL 2 MG/ML IJ SOLN
INTRAMUSCULAR | Status: AC
  Filled 2022-10-27: qty 1

## 2022-10-27 MED ORDER — MAGNESIUM CITRATE PO SOLN: 1.0000 | Freq: Once | ORAL | Status: DC | PRN

## 2022-10-27 MED ORDER — ACETAMINOPHEN 10 MG/ML IV SOLN
1000.0000 mg | Freq: Once | INTRAVENOUS | Status: AC
  Administered 2022-10-27: 1000 mg via INTRAVENOUS
  Filled 2022-10-27: qty 100

## 2022-10-27 MED ORDER — CEFAZOLIN SODIUM-DEXTROSE 2-4 GM/100ML-% IV SOLN
2.0000 g | INTRAVENOUS | Status: AC
  Administered 2022-10-27: 2 g via INTRAVENOUS
  Filled 2022-10-27: qty 100

## 2022-10-27 MED ORDER — METOCLOPRAMIDE HCL 5 MG/ML IJ SOLN: 5.0000 mg | Freq: Three times a day (TID) | INTRAMUSCULAR | Status: DC | PRN

## 2022-10-27 MED ORDER — DOCUSATE SODIUM 100 MG PO CAPS
100.0000 mg | ORAL_CAPSULE | Freq: Two times a day (BID) | ORAL | Status: DC
  Administered 2022-10-27 – 2022-10-30 (×6): 100 mg via ORAL
  Filled 2022-10-27 (×6): qty 1

## 2022-10-27 MED ORDER — RIVAROXABAN 10 MG PO TABS
10.0000 mg | ORAL_TABLET | Freq: Every day | ORAL | Status: DC
  Administered 2022-10-28 – 2022-10-30 (×3): 10 mg via ORAL
  Filled 2022-10-27 (×3): qty 1

## 2022-10-27 MED ORDER — BUPIVACAINE LIPOSOME 1.3 % IJ SUSP
INTRAMUSCULAR | Status: AC
  Filled 2022-10-27: qty 20

## 2022-10-27 MED ORDER — CHLORHEXIDINE GLUCONATE 0.12 % MT SOLN
15.0000 mL | Freq: Once | OROMUCOSAL | Status: AC
  Administered 2022-10-27: 15 mL via OROMUCOSAL

## 2022-10-27 MED ORDER — LORATADINE 10 MG PO TABS
10.0000 mg | ORAL_TABLET | Freq: Every day | ORAL | Status: DC
  Administered 2022-10-29 – 2022-10-30 (×2): 10 mg via ORAL
  Filled 2022-10-27 (×2): qty 1

## 2022-10-27 MED ORDER — HYDROMORPHONE HCL 1 MG/ML IJ SOLN
0.5000 mg | INTRAMUSCULAR | Status: DC | PRN
  Administered 2022-10-27: 0.5 mg via INTRAVENOUS
  Administered 2022-10-28 – 2022-10-29 (×5): 1 mg via INTRAVENOUS
  Filled 2022-10-27 (×6): qty 1

## 2022-10-27 MED ORDER — POTASSIUM CHLORIDE CRYS ER 20 MEQ PO TBCR
20.0000 meq | EXTENDED_RELEASE_TABLET | Freq: Two times a day (BID) | ORAL | Status: DC
  Administered 2022-10-27 – 2022-10-30 (×6): 20 meq via ORAL
  Filled 2022-10-27 (×6): qty 1

## 2022-10-27 MED ORDER — HYDROMORPHONE HCL 1 MG/ML IJ SOLN
0.2500 mg | INTRAMUSCULAR | Status: DC | PRN
  Administered 2022-10-27 (×4): 0.5 mg via INTRAVENOUS

## 2022-10-27 MED ORDER — ONDANSETRON HCL 4 MG/2ML IJ SOLN
INTRAMUSCULAR | Status: AC
  Filled 2022-10-27: qty 2

## 2022-10-27 MED ORDER — MIDAZOLAM HCL 2 MG/2ML IJ SOLN
INTRAMUSCULAR | Status: DC | PRN
  Administered 2022-10-27: 2 mg via INTRAVENOUS

## 2022-10-27 MED ORDER — ONDANSETRON HCL 4 MG/2ML IJ SOLN
INTRAMUSCULAR | Status: DC | PRN
  Administered 2022-10-27: 4 mg via INTRAVENOUS

## 2022-10-27 MED ORDER — ORAL CARE MOUTH RINSE: 15.0000 mL | Freq: Once | OROMUCOSAL | Status: AC

## 2022-10-27 MED ORDER — ACETAMINOPHEN 500 MG PO TABS: 1000.0000 mg | ORAL_TABLET | Freq: Once | ORAL | Status: DC | PRN

## 2022-10-27 MED ORDER — KETAMINE HCL 50 MG/5ML IJ SOSY
PREFILLED_SYRINGE | INTRAMUSCULAR | Status: AC
  Filled 2022-10-27: qty 5

## 2022-10-27 MED ORDER — ONDANSETRON HCL 4 MG/2ML IJ SOLN: 4.0000 mg | Freq: Four times a day (QID) | INTRAMUSCULAR | Status: DC | PRN

## 2022-10-27 MED ORDER — ACETAMINOPHEN 10 MG/ML IV SOLN
1000.0000 mg | Freq: Four times a day (QID) | INTRAVENOUS | Status: DC
Start: 2022-10-27 — End: 2022-10-27

## 2022-10-27 MED ORDER — KETAMINE HCL 10 MG/ML IJ SOLN
INTRAMUSCULAR | Status: DC | PRN
  Administered 2022-10-27: 50 mg via INTRAVENOUS

## 2022-10-27 MED ORDER — PANTOPRAZOLE SODIUM 40 MG PO TBEC: 40.0000 mg | DELAYED_RELEASE_TABLET | Freq: Every day | ORAL | Status: DC | PRN

## 2022-10-27 MED ORDER — FENTANYL CITRATE PF 50 MCG/ML IJ SOSY
25.0000 ug | PREFILLED_SYRINGE | INTRAMUSCULAR | Status: DC | PRN
  Administered 2022-10-27 (×3): 50 ug via INTRAVENOUS

## 2022-10-27 MED ORDER — METHADONE HCL 10 MG PO TABS
10.0000 mg | ORAL_TABLET | Freq: Two times a day (BID) | ORAL | Status: DC
  Administered 2022-10-27 – 2022-10-30 (×7): 10 mg via ORAL
  Filled 2022-10-27 (×7): qty 1

## 2022-10-27 MED ORDER — LEVOTHYROXINE SODIUM 50 MCG PO TABS
50.0000 ug | ORAL_TABLET | Freq: Every day | ORAL | Status: DC
  Administered 2022-10-28 – 2022-10-30 (×3): 50 ug via ORAL
  Filled 2022-10-27 (×3): qty 1

## 2022-10-27 MED ORDER — OXYCODONE HCL 5 MG PO TABS
10.0000 mg | ORAL_TABLET | ORAL | Status: DC | PRN
  Administered 2022-10-27 – 2022-10-29 (×10): 15 mg via ORAL
  Administered 2022-10-30: 10 mg via ORAL
  Filled 2022-10-27 (×2): qty 3
  Filled 2022-10-27: qty 2
  Filled 2022-10-27 (×8): qty 3

## 2022-10-27 MED ORDER — CEFAZOLIN SODIUM-DEXTROSE 2-4 GM/100ML-% IV SOLN
2.0000 g | Freq: Four times a day (QID) | INTRAVENOUS | Status: AC
  Administered 2022-10-27 (×2): 2 g via INTRAVENOUS
  Filled 2022-10-27 (×2): qty 100

## 2022-10-27 MED ORDER — BUPIVACAINE-EPINEPHRINE (PF) 0.5% -1:200000 IJ SOLN
INTRAMUSCULAR | Status: DC | PRN
  Administered 2022-10-27: 30 mL

## 2022-10-27 MED ORDER — PHENYLEPHRINE HCL-NACL 20-0.9 MG/250ML-% IV SOLN
INTRAVENOUS | Status: AC
  Filled 2022-10-27: qty 500

## 2022-10-27 MED ORDER — ACETAMINOPHEN 160 MG/5ML PO SOLN: 1000.0000 mg | Freq: Once | ORAL | Status: DC | PRN

## 2022-10-27 MED ORDER — ALBUTEROL SULFATE (2.5 MG/3ML) 0.083% IN NEBU: 2.5000 mg | INHALATION_SOLUTION | Freq: Four times a day (QID) | RESPIRATORY_TRACT | Status: DC | PRN

## 2022-10-27 MED ORDER — WATER FOR IRRIGATION, STERILE IR SOLN
Status: DC | PRN
  Administered 2022-10-27: 2000 mL

## 2022-10-27 MED ORDER — BACLOFEN 20 MG PO TABS
20.0000 mg | ORAL_TABLET | Freq: Three times a day (TID) | ORAL | Status: DC
  Administered 2022-10-27 – 2022-10-30 (×9): 20 mg via ORAL
  Filled 2022-10-27 (×9): qty 1

## 2022-10-27 MED ORDER — IPRATROPIUM-ALBUTEROL 0.5-2.5 (3) MG/3ML IN SOLN: 3.0000 mL | RESPIRATORY_TRACT | Status: DC | PRN

## 2022-10-27 MED ORDER — PROPOFOL 10 MG/ML IV BOLUS
INTRAVENOUS | Status: AC
  Filled 2022-10-27: qty 20

## 2022-10-27 MED ORDER — FENTANYL CITRATE (PF) 100 MCG/2ML IJ SOLN
INTRAMUSCULAR | Status: AC
  Filled 2022-10-27: qty 2

## 2022-10-27 MED ORDER — 0.9 % SODIUM CHLORIDE (POUR BTL) OPTIME
TOPICAL | Status: DC | PRN
  Administered 2022-10-27: 1000 mL

## 2022-10-27 MED ORDER — OXYCODONE HCL 5 MG PO TABS
5.0000 mg | ORAL_TABLET | ORAL | Status: DC | PRN
  Administered 2022-10-27 – 2022-10-30 (×3): 10 mg via ORAL
  Filled 2022-10-27 (×3): qty 2

## 2022-10-27 MED ORDER — METHOCARBAMOL 500 MG IVPB - SIMPLE MED
500.0000 mg | Freq: Four times a day (QID) | INTRAVENOUS | Status: DC | PRN
  Administered 2022-10-27: 500 mg via INTRAVENOUS

## 2022-10-27 MED ORDER — ONDANSETRON HCL 4 MG PO TABS: 4.0000 mg | ORAL_TABLET | Freq: Four times a day (QID) | ORAL | Status: DC | PRN

## 2022-10-27 SURGICAL SUPPLY — 45 items
BAG COUNTER SPONGE SURGICOUNT (BAG) IMPLANT
BAG DECANTER FOR FLEXI CONT (MISCELLANEOUS) IMPLANT
BAG SPEC THK2 15X12 ZIP CLS (MISCELLANEOUS)
BAG SPNG CNTER NS LX DISP (BAG)
BAG ZIPLOCK 12X15 (MISCELLANEOUS) IMPLANT
BLADE SAG 18X100X1.27 (BLADE) ×1 IMPLANT
COVER PERINEAL POST (MISCELLANEOUS) ×1 IMPLANT
COVER SURGICAL LIGHT HANDLE (MISCELLANEOUS) ×1 IMPLANT
CUP ACETBLR 48 OD SECTOR II (Hips) IMPLANT
DRAPE FOOT SWITCH (DRAPES) ×1 IMPLANT
DRAPE STERI IOBAN 125X83 (DRAPES) ×1 IMPLANT
DRAPE U-SHAPE 47X51 STRL (DRAPES) ×2 IMPLANT
DRESSING AQUACEL AG SP 3.5X10 (GAUZE/BANDAGES/DRESSINGS) IMPLANT
DRSG AQUACEL AG ADV 3.5X10 (GAUZE/BANDAGES/DRESSINGS) ×1 IMPLANT
DRSG AQUACEL AG SP 3.5X10 (GAUZE/BANDAGES/DRESSINGS) ×1
DURAPREP 26ML APPLICATOR (WOUND CARE) ×1 IMPLANT
ELECT REM PT RETURN 15FT ADLT (MISCELLANEOUS) ×1 IMPLANT
GLOVE BIO SURGEON STRL SZ 6.5 (GLOVE) IMPLANT
GLOVE BIO SURGEON STRL SZ7.5 (GLOVE) IMPLANT
GLOVE BIO SURGEON STRL SZ8 (GLOVE) ×1 IMPLANT
GLOVE BIOGEL PI IND STRL 6.5 (GLOVE) IMPLANT
GLOVE BIOGEL PI IND STRL 7.0 (GLOVE) IMPLANT
GLOVE BIOGEL PI IND STRL 8 (GLOVE) ×1 IMPLANT
GOWN STRL REUS W/ TWL LRG LVL3 (GOWN DISPOSABLE) ×1 IMPLANT
GOWN STRL REUS W/ TWL XL LVL3 (GOWN DISPOSABLE) IMPLANT
GOWN STRL REUS W/TWL LRG LVL3 (GOWN DISPOSABLE) ×1
GOWN STRL REUS W/TWL XL LVL3 (GOWN DISPOSABLE)
HEAD CERAMIC DELTA 28M 12/14P5 (Head) IMPLANT
HOLDER FOLEY CATH W/STRAP (MISCELLANEOUS) ×1 IMPLANT
KIT TURNOVER KIT A (KITS) IMPLANT
LINER MARATHON 28 48 (Hips) IMPLANT
MANIFOLD NEPTUNE II (INSTRUMENTS) ×1 IMPLANT
PACK ANTERIOR HIP CUSTOM (KITS) ×1 IMPLANT
PENCIL SMOKE EVACUATOR COATED (MISCELLANEOUS) ×1 IMPLANT
SPIKE FLUID TRANSFER (MISCELLANEOUS) ×1 IMPLANT
STEM FEMORAL SZ 5MM STD ACTIS (Stem) IMPLANT
STRIP CLOSURE SKIN 1/2X4 (GAUZE/BANDAGES/DRESSINGS) ×1 IMPLANT
SUT ETHIBOND NAB CT1 #1 30IN (SUTURE) ×1 IMPLANT
SUT MNCRL AB 4-0 PS2 18 (SUTURE) ×1 IMPLANT
SUT STRATAFIX 0 PDS 27 VIOLET (SUTURE) ×1
SUT VIC AB 2-0 CT1 27 (SUTURE) ×2
SUT VIC AB 2-0 CT1 TAPERPNT 27 (SUTURE) ×2 IMPLANT
SUTURE STRATFX 0 PDS 27 VIOLET (SUTURE) ×1 IMPLANT
TRAY FOLEY MTR SLVR 16FR STAT (SET/KITS/TRAYS/PACK) ×1 IMPLANT
TUBE SUCTION HIGH CAP CLEAR NV (SUCTIONS) ×1 IMPLANT

## 2022-10-27 NOTE — Interval H&P Note (Signed)
History and Physical Interval Note:  10/27/2022 8:12 AM  Crystal [name]  has presented today for surgery, with the diagnosis of right hip osteoarthritis.  The various methods of treatment have been discussed with the patient and family. After consideration of risks, benefits and other options for treatment, the patient has consented to  Procedure(s): TOTAL HIP ARTHROPLASTY ANTERIOR APPROACH (Right) as a surgical intervention.  The patient's history has been reviewed, patient examined, no change in status, stable for surgery.  I have reviewed the patient's chart and labs.  Questions were answered to the patient's satisfaction.     Pilar Plate Verdella Laidlaw

## 2022-10-27 NOTE — Evaluation (Addendum)
Physical Therapy Evaluation Patient Details Name: Crystal [name] MRN: [id number] DOB: 1961/06/15 Today's Date: 10/27/2022  History of Present Illness  61 yo s/p R DA THA. PMH: fibromyalgia, DM, chronic fatigue syndrome, L5-S1  Clinical Impression  Pt is s/p THA resulting in the deficits listed below (see PT Problem List).  Pt with limited mobility today d/t baseline limitations and c/o severe post op pain. Pt agreeable to attempting transfer to [facility] as she had been unable to void with purewick in place.   Returned pt to bed for pt  and staff safety. Further amb or transition to chair deferred d/t pain.  Anticipate prolonged acute stay however pt was a minimal ambulator at baseline and she does use a power w/c for longer distances, goals set accordingly.   Pt will benefit from skilled PT to increase their independence and safety with mobility to allow discharge to the venue listed below.         Recommendations for follow up therapy are one component of a multi-disciplinary discharge planning process, led by the attending physician.  Recommendations may be updated based on patient status, additional functional criteria and insurance authorization.  Follow Up Recommendations Follow physician's recommendations for discharge plan and follow up therapies      Assistance Recommended at Discharge Frequent or constant Supervision/Assistance  Patient can return home with the following  A little help with walking and/or transfers;A little help with bathing/dressing/bathroom;Help with stairs or ramp for entrance;Assist for transportation;Assistance with cooking/housework    Equipment Recommendations None recommended by PT  Recommendations for Other Services       Functional Status Assessment Patient has had a recent decline in their functional status and demonstrates the ability to make significant improvements in function in a reasonable and predictable amount of time.     Precautions /  Restrictions Precautions Precautions: Fall Restrictions Weight Bearing Restrictions: No Other Position/Activity Restrictions: WBAT      Mobility  Bed Mobility Overal bed mobility: Needs Assistance Bed Mobility: Supine to Sit, Sit to Supine     Supine to sit: Mod assist, Max assist, +2 for physical assistance, +2 for safety/equipment, HOB elevated Sit to supine: Mod assist, Max assist, +2 for physical assistance, +2 for safety/equipment   General bed mobility comments: assist with LEs and trunk elevation. pt able to self assist scooting to EOB with min/guard and incr time/effort    Transfers Overall transfer level: Needs assistance Equipment used: Rolling walker (2 wheels) Transfers: Sit to/from Stand, Bed to chair/wheelchair/BSC Sit to Stand: Min assist, Mod assist, +2 physical assistance, +2 safety/equipment   Step pivot transfers: Mod assist, Max assist, +2 physical assistance, +2 safety/equipment       General transfer comment: cues for breathing, hand placement, sequence of transfers as well as use of UEs to self assist and control pain with WBing    Ambulation/Gait               General Gait Details: deferred d/t pain  Stairs            Wheelchair Mobility    Modified Rankin (Stroke Patients Only)       Balance Overall balance assessment: Needs assistance Sitting-balance support: Single extremity supported, Bilateral upper extremity supported, Feet supported Sitting balance-Leahy Scale: Fair     Standing balance support: Reliant on assistive device for balance, During functional activity Standing balance-Leahy Scale: Poor  Pertinent Vitals/Pain Pain Assessment Pain Assessment: Faces Pain Score: 6  Pain Location: right hip Pain Descriptors / Indicators: Other (Comment), Burning (screaming) Pain Intervention(s): Limited activity within patient's tolerance, Monitored during session, Premedicated  before session, Repositioned, Ice applied    Home Living Family/patient expects to be discharged to:: Private residence Living Arrangements: Spouse/significant other Available Help at Discharge: Available 24 hours/day Type of Home: House Home Access: Ramped entrance       Home Layout: Multi-level;Able to live on main level with bedroom/bathroom Home Equipment: Wheelchair - Engineer, manufacturing systems (2 wheels) Additional Comments: stair lift,scooter    Prior Function Prior Level of Function : Needs assist       Physical Assist : Mobility (physical);ADLs (physical)   ADLs (physical): Bathing Mobility Comments: short distance amb with RW (at times) w/c longer distances ADLs Comments: varies- assist with ADLs, cooks Pt is a retired Training and development officer        Extremity/Trunk Assessment   Upper Extremity Assessment Upper Extremity Assessment: Defer to OT evaluation    Lower Extremity Assessment Lower Extremity Assessment: RLE deficits/detail RLE Deficits / Details: ankle WFL, further testing limtied by pain; AAROM-functional ROM to ~75 degrees hip flexion, ~80 degrees knee flexion       Communication   Communication: No difficulties  Cognition Arousal/Alertness: Awake/alert Behavior During Therapy: WFL for tasks assessed/performed Overall Cognitive Status: Within Functional Limits for tasks assessed                                          General Comments      Exercises Total Joint Exercises Ankle Circles/Pumps: AROM, Both, 10 reps   Assessment/Plan    PT Assessment Patient needs continued PT services  PT Problem List Decreased strength;Decreased range of motion;Decreased activity tolerance;Decreased balance;Decreased mobility;Obesity       PT Treatment Interventions DME instruction;Therapeutic exercise;Gait training;Functional mobility training;Therapeutic activities;Patient/family education;Balance training    PT Goals (Current goals can  be found in the Care Plan section)  Acute Rehab PT Goals Patient Stated Goal: less pain PT Goal Formulation: With patient Time For Goal Achievement: 11/10/22 Potential to Achieve Goals: Fair    Frequency 7X/week     Co-evaluation               AM-PAC PT "6 Clicks" Mobility  Outcome Measure Help needed turning from your back to your side while in a flat bed without using bedrails?: Total Help needed moving from lying on your back to sitting on the side of a flat bed without using bedrails?: Total Help needed moving to and from a bed to a chair (including a wheelchair)?: Total Help needed standing up from a chair using your arms (e.g., wheelchair or bedside chair)?: Total Help needed to walk in hospital room?: Total Help needed climbing 3-5 steps with a railing? : Total 6 Click Score: 6    End of Session Equipment Utilized During Treatment: Gait belt Activity Tolerance: Patient limited by pain Patient left: in bed;with call bell/phone within reach;with bed alarm set;with family/visitor present Nurse Communication: Mobility status PT Visit Diagnosis: Other abnormalities of gait and mobility (R26.89);Difficulty in walking, not elsewhere classified (R26.2)    Time: 8416-6063 PT Time Calculation (min) (ACUTE ONLY): 39 min   Charges:   PT Evaluation $PT Eval Low Complexity: 1 Low PT Treatments $Therapeutic Activity: 23-37 mins  Baxter Flattery, PT  Acute Rehab Dept [facility]) [phone]  [facility] Weekend Pager [facility] only)  [phone]  10/27/2022   [facility] 10/27/2022, 5:20 PM

## 2022-10-27 NOTE — Plan of Care (Signed)
  Problem: Nutrition: Goal: Adequate nutrition will be maintained Outcome: Progressing   

## 2022-10-27 NOTE — Anesthesia Preprocedure Evaluation (Signed)
Anesthesia Evaluation  Patient identified by MRN, date of birth, ID band Patient awake    Reviewed: Allergy & Precautions, NPO status , Patient's Chart, lab work & pertinent test results  History of Anesthesia Complications (+) history of anesthetic complications  Airway Mallampati: II  TM Distance: >3 FB Neck ROM: Full    Dental  (+) Teeth Intact, Dental Advisory Given   Pulmonary asthma , sleep apnea    breath sounds clear to auscultation       Cardiovascular (-) hypertension+ angina  + CAD  (-) Past MI and (-) CHF  Rhythm:Regular   LEFT VENTRICLE:   -  Left ventricular size was normal.   -  Overall left ventricular systolic function was normal.   -  Left ventricular ejection fraction was estimated , range being 60         % to 65 %.   -  There were no left ventricular regional wall motion         abnormalities.   -  Left ventricular wall thickness was mildly increased.    AORTIC VALVE:   -  The aortic valve was trileaflet.   -  Aortic valve thickness was normal.   -  There was normal aortic valve leaflet excursion.     Doppler interpretation(s):   -  Transaortic velocity was within the normal range.   -  There was no evidence for aortic valve stenosis.   -  There was no significant aortic valvular regurgitation.    MITRAL VALVE:   -  Mitral valve structure was normal.   -  There was normal mitral valve leaflet excursion.     Doppler interpretation(s):   -  The transmitral velocity was within the normal range.   -  There was no evidence for mitral stenosis.   -  There was no significant mitral valvular regurgitation.    LEFT ATRIUM:   -  Left atrial size was normal.    RIGHT VENTRICLE:   -  Right ventricular size was normal.   -  Right ventricular systolic function was normal.   -  Right ventricular wall thickness was normal.    PULMONIC VALVE:   -  The structure of the pulmonic valve appeared to be normal.      Doppler interpretation(s):   -  The transpulmonic velocity was within the normal range.   -  There was no pulmonic valve stenosis.   -  There was no significant pulmonic regurgitation.    TRICUSPID VALVE:   Doppler interpretation(s):   -  There was trivial tricuspid valvular regurgitation.    PULMONARY ARTERY:   -  The pulmonary artery morphology appeared normal.    RIGHT ATRIUM:   -  Right atrial size was normal.    SYSTEMIC VEINS:   -  The inferior vena cava was normal.     Neuro/Psych Seizures -, Well Controlled,  PSYCHIATRIC DISORDERS Anxiety      Neuromuscular disease    GI/Hepatic Neg liver ROS,GERD  Medicated,,  Endo/Other  diabetesHypothyroidism  Morbid obesity  Renal/GU Lab Results      Component                Value               Date                      CREATININE  0.92                10/21/2022                Musculoskeletal  (+) Arthritis ,  Fibromyalgia -  Abdominal   Peds  Hematology negative hematology ROS (+) Lab Results      Component                Value               Date                      WBC                      7.5                 10/21/2022                HGB                      14.5                10/21/2022                HCT                      47.0 (H)            10/21/2022                MCV                      85.6                10/21/2022                PLT                      307                 10/21/2022              Anesthesia Other Findings   Reproductive/Obstetrics                              Anesthesia Physical Anesthesia Plan  ASA: 3  Anesthesia Plan: MAC and Spinal   Post-op Pain Management: Ofirmev IV (intra-op)* and Toradol IV (intra-op)*   Induction: Intravenous  PONV Risk Score and Plan: 2 and Propofol infusion and Treatment may vary due to age or medical condition  Airway Management Planned: Nasal Cannula and Natural Airway  Additional Equipment:  None  Intra-op Plan:   Post-operative Plan: Extubation in OR  Informed Consent: I have reviewed the patients History and Physical, chart, labs and discussed the procedure including the risks, benefits and alternatives for the proposed anesthesia with the patient or authorized representative who has indicated his/her understanding and acceptance.     Dental advisory given  Plan Discussed with: CRNA  Anesthesia Plan Comments:          Anesthesia Quick Evaluation

## 2022-10-27 NOTE — Anesthesia Procedure Notes (Signed)
Procedure Name: Intubation Date/Time: 10/27/2022 8:46 AM  Performed by: Bufford Spikes, CRNAPre-anesthesia Checklist: Patient identified, Emergency Drugs available, Suction available and Patient being monitored Patient Re-evaluated:Patient Re-evaluated prior to induction Oxygen Delivery Method: Circle system utilized Preoxygenation: Pre-oxygenation with 100% oxygen Induction Type: IV induction Ventilation: Mask ventilation without difficulty Laryngoscope Size: Miller and 2 Grade View: Grade II Tube type: Oral Tube size: 7.0 mm Number of attempts: 1 Airway Equipment and Method: Stylet and Oral airway Placement Confirmation: ETT inserted through vocal cords under direct vision, positive ETCO2 and breath sounds checked- equal and bilateral Secured at: 21 cm Tube secured with: Tape Dental Injury: Teeth and Oropharynx as per pre-operative assessment

## 2022-10-27 NOTE — Op Note (Signed)
OPERATIVE REPORT- TOTAL HIP ARTHROPLASTY   PREOPERATIVE DIAGNOSIS: Osteoarthritis of the Right hip.   POSTOPERATIVE DIAGNOSIS: Osteoarthritis of the Right  hip.   PROCEDURE: Right total hip arthroplasty, anterior approach.   SURGEON: [name], MD   ASSISTANT: Theresa Duty, PA-C  ANESTHESIA:  General  ESTIMATED BLOOD LOSS:-500 mL    DRAINS: None  COMPLICATIONS: None   CONDITION: PACU - hemodynamically stable.   BRIEF CLINICAL NOTE: Crystal [name] is a 61 y.o. female who has advanced end-  stage arthritis of their Right  hip with progressively worsening pain and  dysfunction.The patient has failed nonoperative management and presents for  total hip arthroplasty.   PROCEDURE IN DETAIL: After successful administration of spinal  anesthetic, the traction boots for the [facility] bed were placed on both  feet and the patient was placed onto the [facility] bed, boots placed into the leg  holders. The Right hip was then isolated from the perineum with plastic  drapes and prepped and draped in the usual sterile fashion. ASIS and  greater trochanter were marked and a oblique incision was made, starting  at about 1 cm lateral and 2 cm distal to the ASIS and coursing towards  the anterior cortex of the femur. The skin was cut with a 10 blade  through subcutaneous tissue to the level of the fascia overlying the  tensor fascia lata muscle. The fascia was then incised in line with the  incision at the junction of the anterior third and posterior 2/3rd. The  muscle was teased off the fascia and then the interval between the TFL  and the rectus was developed. The Hohmann retractor was then placed at  the top of the femoral neck over the capsule. The vessels overlying the  capsule were cauterized and the fat on top of the capsule was removed.  A Hohmann retractor was then placed anterior underneath the rectus  femoris to give exposure to the entire anterior capsule. A T-shaped   capsulotomy was performed. The edges were tagged and the femoral head  was identified.       Osteophytes are removed off the superior acetabulum.  The femoral neck was then cut in situ with an oscillating saw. Traction  was then applied to the left lower extremity utilizing the [facility]  traction. The femoral head was then removed. Retractors were placed  around the acetabulum and then circumferential removal of the labrum was  performed. Osteophytes were also removed. Reaming starts at 45 mm to  medialize and  Increased in 2 mm increments to 47 mm. We reamed in  approximately 40 degrees of abduction, 20 degrees anteversion. A 48 mm  pinnacle acetabular shell was then impacted in anatomic position under  fluoroscopic guidance with excellent purchase. We did not need to place  any additional dome screws. A 28 mm neutral + 4 marathon liner was then  placed into the acetabular shell.       The femoral lift was then placed along the lateral aspect of the femur  just distal to the vastus ridge. The leg was  externally rotated and capsule  was stripped off the inferior aspect of the femoral neck down to the  level of the lesser trochanter, this was done with electrocautery. The femur was lifted after this was performed. The  leg was then placed in an extended and adducted position essentially delivering the femur. We also removed the capsule superiorly and the piriformis from the piriformis fossa to  gain excellent exposure of the  proximal femur. Rongeur was used to remove some cancellous bone to get  into the lateral portion of the proximal femur for placement of the  initial starter reamer. The starter broaches was placed  the starter broach  and was shown to go down the center of the canal. Broaching  with the Actis system was then performed starting at size 0  coursing  Up to size 5. A size 5 had excellent torsional and rotational  and axial stability. The trial standard offset neck was then  placed  with a 28 + 5 trial head. The hip was then reduced. We confirmed that  the stem was in the canal both on AP and lateral x-rays. It also has excellent sizing. The hip was reduced with outstanding stability through full extension and full external rotation.. AP pelvis was taken and the leg lengths were measured and found to be equal. Hip was then dislocated again and the femoral head and neck removed. The  femoral broach was removed. Size 5 Actis stem with a standard offset  neck was then impacted into the femur following native anteversion. Has  excellent purchase in the canal. Excellent torsional and rotational and  axial stability. It is confirmed to be in the canal on AP and lateral  fluoroscopic views. The 28 + 5 ceramic head was placed and the hip  reduced with outstanding stability. Again AP pelvis was taken and it  confirmed that the leg lengths were equal. The wound was then copiously  irrigated with saline solution and the capsule reattached and repaired  with Ethibond suture. 30 ml of .25% Bupivicaine was  injected into the capsule and into the edge of the tensor fascia lata as well as subcutaneous tissue. The fascia overlying the tensor fascia lata was then closed with a running #1 V-Loc. Subcu was closed with interrupted 2-0 Vicryl and subcuticular running 4-0 Monocryl. Incision was cleaned  and dried. Steri-Strips and a bulky sterile dressing applied. The patient was awakened and transported to  recovery in stable condition.        Please note that a surgical assistant was a medical necessity for this procedure to perform it in a safe and expeditious manner. Assistant was necessary to provide appropriate retraction of vital neurovascular structures and to prevent femoral fracture and allow for anatomic placement of the prosthesis.  [name], M.D.

## 2022-10-27 NOTE — Anesthesia Postprocedure Evaluation (Signed)
Anesthesia Post Note  Patient: Crystal [name]  Procedure(s) Performed: TOTAL HIP ARTHROPLASTY ANTERIOR APPROACH (Right: Hip)     Patient location during evaluation: PACU Anesthesia Type: General Level of consciousness: awake and alert Pain management: pain level controlled Vital Signs Assessment: post-procedure vital signs reviewed and stable Respiratory status: spontaneous breathing, nonlabored ventilation and respiratory function stable Cardiovascular status: blood pressure returned to baseline and stable Postop Assessment: no apparent nausea or vomiting Anesthetic complications: no   No notable events documented.  Last Vitals:  Vitals:   10/27/22 1338 10/27/22 1736  BP: (!) 153/75 (!) 148/65  Pulse: 84 77  Resp: 18 18  Temp: 36.5 C 36.7 C  SpO2: 95% 96%    Last Pain:  Vitals:   10/27/22 1751  TempSrc:   PainSc: 7                  Rakeen Gaillard

## 2022-10-27 NOTE — Transfer of Care (Signed)
Immediate Anesthesia Transfer of Care Note  Patient: Crystal [name]  Procedure(s) Performed: TOTAL HIP ARTHROPLASTY ANTERIOR APPROACH (Right: Hip)  Patient Location: PACU  Anesthesia Type:General  Level of Consciousness: awake, alert , and oriented  Airway & Oxygen Therapy: Patient Spontanous Breathing and Patient connected to face mask oxygen  Post-op Assessment: Report given to RN and Post -op Vital signs reviewed and stable  Post vital signs: Reviewed and stable  Last Vitals:  Vitals Value Taken Time  BP 123/72 10/27/22 1042  Temp    Pulse 74 10/27/22 1044  Resp 12 10/27/22 1044  SpO2 100 % 10/27/22 1044  Vitals shown include unvalidated device data.  Last Pain:  Vitals:   10/27/22 0713  TempSrc:   PainSc: 0-No pain         Complications: No notable events documented.

## 2022-10-27 NOTE — Discharge Instructions (Addendum)
Crystal Arabian, MD Total Joint Specialist [facility] [address], [address] [place], [place] [address] [phone]  ANTERIOR APPROACH TOTAL HIP REPLACEMENT POSTOPERATIVE DIRECTIONS     Hip Rehabilitation, Guidelines Following Surgery  The results of a hip operation are greatly improved after range of motion and muscle strengthening exercises. Follow all safety measures which are given to protect your hip. If any of these exercises cause increased pain or swelling in your joint, decrease the amount until you are comfortable again. Then slowly increase the exercises. Call your caregiver if you have problems or questions.   BLOOD CLOT PREVENTION Take a 10 mg Xarelto once a day for three weeks following surgery.  You may resume your vitamins/supplements once you have discontinued the Xarelto.  HOME CARE INSTRUCTIONS  Remove items at home which could result in a fall. This includes throw rugs or furniture in walking pathways.  ICE to the affected hip as frequently as 20-30 minutes an hour and then as needed for pain and swelling. Continue to use ice on the hip for pain and swelling from surgery. You may notice swelling that will progress down to the foot and ankle. This is normal after surgery. Elevate the leg when you are not up walking on it.   Continue to use the breathing machine which will help keep your temperature down.  It is common for your temperature to cycle up and down following surgery, especially at night when you are not up moving around and exerting yourself.  The breathing machine keeps your lungs expanded and your temperature down.  DIET You may resume your previous home diet once your are discharged from the hospital.  DRESSING / WOUND CARE / SHOWERING You have an adhesive waterproof bandage over the incision. Leave this in place until your first follow-up appointment. Once you remove this you will not need to place another bandage.  You may begin  showering 3 days following surgery, but do not submerge the incision under water.  ACTIVITY For the first 3-5 days, it is important to rest and keep the operative leg elevated. You should, as a general rule, rest for 50 minutes and walk/stretch for 10 minutes per hour. After 5 days, you may slowly increase activity as tolerated.  Perform the exercises you were provided twice a day for about 15-20 minutes each session. Begin these 2 days following surgery. Walk with your walker as instructed. Use the walker until you are comfortable transitioning to a cane. Walk with the cane in the opposite hand of the operative leg. You may discontinue the cane once you are comfortable and walking steadily. Avoid periods of inactivity such as sitting longer than an hour when not asleep. This helps prevent blood clots.  Do not drive a car for 6 weeks or until released by your surgeon.  Do not drive while taking narcotics.  TED HOSE STOCKINGS Wear the elastic stockings on both legs for three weeks following surgery during the day. You may remove them at night while sleeping.  WEIGHT BEARING Weight bearing as tolerated with assist device (walker, cane, etc) as directed, use it as long as suggested by your surgeon or therapist, typically at least 4-6 weeks.  POSTOPERATIVE CONSTIPATION PROTOCOL Constipation - defined medically as fewer than three stools per week and severe constipation as less than one stool per week.  One of the most common issues patients have following surgery is constipation.  Even if you have a regular bowel pattern at home, your normal  regimen is likely to be disrupted due to multiple reasons following surgery.  Combination of anesthesia, postoperative narcotics, change in appetite and fluid intake all can affect your bowels.  In order to avoid complications following surgery, here are some recommendations in order to help you during your recovery period.  Colace (docusate) - Pick up an  over-the-counter form of Colace or another stool softener and take twice a day as long as you are requiring postoperative pain medications.  Take with a full glass of water daily.  If you experience loose stools or diarrhea, hold the colace until you stool forms back up.  If your symptoms do not get better within 1 week or if they get worse, check with your doctor. Dulcolax (bisacodyl) - Pick up over-the-counter and take as directed by the product packaging as needed to assist with the movement of your bowels.  Take with a full glass of water.  Use this product as needed if not relieved by Colace only.  MiraLax (polyethylene glycol) - Pick up over-the-counter to have on hand.  MiraLax is a solution that will increase the amount of water in your bowels to assist with bowel movements.  Take as directed and can mix with a glass of water, juice, soda, coffee, or tea.  Take if you go more than two days without a movement.Do not use MiraLax more than once per day. Call your doctor if you are still constipated or irregular after using this medication for 7 days in a row.  If you continue to have problems with postoperative constipation, please contact the office for further assistance and recommendations.  If you experience "the worst abdominal pain ever" or develop nausea or vomiting, please contact the office immediatly for further recommendations for treatment.  ITCHING  If you experience itching with your medications, try taking only a single pain pill, or even half a pain pill at a time.  You can also use Benadryl over the counter for itching or also to help with sleep.   MEDICATIONS See your medication summary on the "After Visit Summary" that the nursing staff will review with you prior to discharge.  You may have some home medications which will be placed on hold until you complete the course of blood thinner medication.  It is important for you to complete the blood thinner medication as prescribed by  your surgeon.  Continue your approved medications as instructed at time of discharge.  PRECAUTIONS If you experience chest pain or shortness of breath - call 911 immediately for transfer to the hospital emergency department.  If you develop a fever greater that 101 F, purulent drainage from wound, increased redness or drainage from wound, foul odor from the wound/dressing, or calf pain - CONTACT YOUR SURGEON.                                                   FOLLOW-UP APPOINTMENTS Make sure you keep all of your appointments after your operation with your surgeon and caregivers. You should call the office at the above phone number and make an appointment for approximately two weeks after the date of your surgery or on the date instructed by your surgeon outlined in the "After Visit Summary".  RANGE OF MOTION AND STRENGTHENING EXERCISES  These exercises are designed to help you keep full movement of your hip  joint. Follow your caregiver's or physical therapist's instructions. Perform all exercises about fifteen times, three times per day or as directed. Exercise both hips, even if you have had only one joint replacement. These exercises can be done on a training (exercise) mat, on the floor, on a table or on a bed. Use whatever works the best and is most comfortable for you. Use music or television while you are exercising so that the exercises are a pleasant break in your day. This will make your life better with the exercises acting as a break in routine you can look forward to.  Lying on your back, slowly slide your foot toward your buttocks, raising your knee up off the floor. Then slowly slide your foot back down until your leg is straight again.  Lying on your back spread your legs as far apart as you can without causing discomfort.  Lying on your side, raise your upper leg and foot straight up from the floor as far as is comfortable. Slowly lower the leg and repeat.  Lying on your back, tighten up  the muscle in the front of your thigh (quadriceps muscles). You can do this by keeping your leg straight and trying to raise your heel off the floor. This helps strengthen the largest muscle supporting your knee.  Lying on your back, tighten up the muscles of your buttocks both with the legs straight and with the knee bent at a comfortable angle while keeping your heel on the floor.   POST-OPERATIVE OPIOID TAPER INSTRUCTIONS: It is important to wean off of your opioid medication as soon as possible. If you do not need pain medication after your surgery it is ok to stop day one. Opioids include: Codeine, Hydrocodone(Norco, Vicodin), Oxycodone(Percocet, oxycontin) and hydromorphone amongst others.  Long term and even short term use of opiods can cause: Increased pain response Dependence Constipation Depression Respiratory depression And more.  Withdrawal symptoms can include Flu like symptoms Nausea, vomiting And more Techniques to manage these symptoms Hydrate well Eat regular healthy meals Stay active Use relaxation techniques(deep breathing, meditating, yoga) Do Not substitute Alcohol to help with tapering If you have been on opioids for less than two weeks and do not have pain than it is ok to stop all together.  Plan to wean off of opioids This plan should start within one week post op of your joint replacement. Maintain the same interval or time between taking each dose and first decrease the dose.  Cut the total daily intake of opioids by one tablet each day Next start to increase the time between doses. The last dose that should be eliminated is the evening dose.   IF YOU ARE TRANSFERRED TO A SKILLED REHAB FACILITY If the patient is transferred to a skilled rehab facility following release from the hospital, a list of the current medications will be sent to the facility for the patient to continue.  When discharged from the skilled rehab facility, please have the facility set  up the patient's [place] prior to being released. Also, the skilled facility will be responsible for providing the patient with their medications at time of release from the facility to include their pain medication, the muscle relaxants, and their blood thinner medication. If the patient is still at the rehab facility at time of the two week follow up appointment, the skilled rehab facility will also need to assist the patient in arranging follow up appointment in our office and any transportation needs.  MAKE SURE YOU:  Understand these instructions.  Get help right away if you are not doing well or get worse.    DENTAL ANTIBIOTICS:  In most cases prophylactic antibiotics for Dental procdeures after total joint surgery are not necessary.  Exceptions are as follows:  1. History of prior total joint infection  2. Severely immunocompromised (Organ Transplant, cancer chemotherapy, Rheumatoid biologic meds such as [place])  3. Poorly controlled diabetes (A1C &gt; 8.0, blood glucose over 200)  If you have one of these conditions, contact your surgeon for an antibiotic prescription, prior to your dental procedure.    Pick up stool softner and laxative for home use following surgery while on pain medications. Do not submerge incision under water. Please use good hand washing techniques while changing dressing each day. May shower starting three days after surgery. Please use a clean towel to pat the incision dry following showers. Continue to use ice for pain and swelling after surgery. Do not use any lotions or creams on the incision until instructed by your surgeon.    Information on my medicine - XARELTO (Rivaroxaban)  This medication education was reviewed with me or my healthcare representative as part of my discharge preparation.  The pharmacist that spoke with me during my hospital stay was:  Wayland Salinas, [facility]  Why was Xarelto prescribed  for you? Xarelto was prescribed for you to reduce the risk of blood clots forming after orthopedic surgery. The medical term for these abnormal blood clots is venous thromboembolism (VTE).  What do you need to know about xarelto ? Take your Xarelto ONCE DAILY at the same time every day. You may take it either with or without food.  If you have difficulty swallowing the tablet whole, you may crush it and mix in applesauce just prior to taking your dose.  Take Xarelto exactly as prescribed by your doctor and DO NOT stop taking Xarelto without talking to the doctor who prescribed the medication.  Stopping without other VTE prevention medication to take the place of Xarelto may increase your risk of developing a clot.  After discharge, you should have regular check-up appointments with your healthcare provider that is prescribing your Xarelto.    What do you do if you miss a dose? If you miss a dose, take it as soon as you remember on the same day then continue your regularly scheduled once daily regimen the next day. Do not take two doses of Xarelto on the same day.   Important Safety Information A possible side effect of Xarelto is bleeding. You should call your healthcare provider right away if you experience any of the following: Bleeding from an injury or your nose that does not stop. Unusual colored urine (red or dark brown) or unusual colored stools (red or black). Unusual bruising for unknown reasons. A serious fall or if you hit your head (even if there is no bleeding).  Some medicines may interact with Xarelto and might increase your risk of bleeding while on Xarelto. To help avoid this, consult your healthcare provider or pharmacist prior to using any new prescription or non-prescription medications, including herbals, vitamins, non-steroidal anti-inflammatory drugs (NSAIDs) and supplements.  This website has more information on Xarelto: [email or web address]

## 2022-10-28 ENCOUNTER — Encounter (HOSPITAL_COMMUNITY): Payer: Self-pay | Admitting: Orthopedic Surgery

## 2022-10-28 DIAGNOSIS — M797 Fibromyalgia: Secondary | ICD-10-CM | POA: Diagnosis present

## 2022-10-28 DIAGNOSIS — Z882 Allergy status to sulfonamides status: Secondary | ICD-10-CM | POA: Diagnosis not present

## 2022-10-28 DIAGNOSIS — M47816 Spondylosis without myelopathy or radiculopathy, lumbar region: Secondary | ICD-10-CM | POA: Diagnosis present

## 2022-10-28 DIAGNOSIS — Z885 Allergy status to narcotic agent status: Secondary | ICD-10-CM | POA: Diagnosis not present

## 2022-10-28 DIAGNOSIS — Z79890 Hormone replacement therapy (postmenopausal): Secondary | ICD-10-CM | POA: Diagnosis not present

## 2022-10-28 DIAGNOSIS — Z886 Allergy status to analgesic agent status: Secondary | ICD-10-CM | POA: Diagnosis not present

## 2022-10-28 DIAGNOSIS — M5136 Other intervertebral disc degeneration, lumbar region: Secondary | ICD-10-CM | POA: Diagnosis present

## 2022-10-28 DIAGNOSIS — Z6841 Body Mass Index (BMI) 40.0 and over, adult: Secondary | ICD-10-CM | POA: Diagnosis not present

## 2022-10-28 DIAGNOSIS — Z9049 Acquired absence of other specified parts of digestive tract: Secondary | ICD-10-CM | POA: Diagnosis not present

## 2022-10-28 DIAGNOSIS — Z90710 Acquired absence of both cervix and uterus: Secondary | ICD-10-CM | POA: Diagnosis not present

## 2022-10-28 DIAGNOSIS — Z7984 Long term (current) use of oral hypoglycemic drugs: Secondary | ICD-10-CM | POA: Diagnosis not present

## 2022-10-28 DIAGNOSIS — Z91048 Other nonmedicinal substance allergy status: Secondary | ICD-10-CM | POA: Diagnosis not present

## 2022-10-28 DIAGNOSIS — E039 Hypothyroidism, unspecified: Secondary | ICD-10-CM | POA: Diagnosis present

## 2022-10-28 DIAGNOSIS — M1611 Unilateral primary osteoarthritis, right hip: Secondary | ICD-10-CM | POA: Diagnosis present

## 2022-10-28 DIAGNOSIS — Z993 Dependence on wheelchair: Secondary | ICD-10-CM | POA: Diagnosis not present

## 2022-10-28 DIAGNOSIS — G4730 Sleep apnea, unspecified: Secondary | ICD-10-CM | POA: Diagnosis present

## 2022-10-28 DIAGNOSIS — E119 Type 2 diabetes mellitus without complications: Secondary | ICD-10-CM | POA: Diagnosis present

## 2022-10-28 LAB — CBC
HCT: 40.3 % (ref 36.0–46.0)
Hemoglobin: 12.5 g/dL (ref 12.0–15.0)
MCH: 26.7 pg (ref 26.0–34.0)
MCHC: 31 g/dL (ref 30.0–36.0)
MCV: 85.9 fL (ref 80.0–100.0)
Platelets: 248 10*3/uL (ref 150–400)
RBC: 4.69 MIL/uL (ref 3.87–5.11)
RDW: 14.9 % (ref 11.5–15.5)
WBC: 16.5 10*3/uL — ABNORMAL HIGH (ref 4.0–10.5)
nRBC: 0 % (ref 0.0–0.2)

## 2022-10-28 LAB — BASIC METABOLIC PANEL
Anion gap: 9 (ref 5–15)
BUN: 6 mg/dL — ABNORMAL LOW (ref 8–23)
CO2: 29 mmol/L (ref 22–32)
Calcium: 8.6 mg/dL — ABNORMAL LOW (ref 8.9–10.3)
Chloride: 104 mmol/L (ref 98–111)
Creatinine, Ser: 0.76 mg/dL (ref 0.44–1.00)
GFR, Estimated: >60 mL/min (ref >60)
Glucose, Bld: 134 mg/dL — ABNORMAL HIGH (ref 70–99)
Potassium: 3.9 mmol/L (ref 3.5–5.1)
Sodium: 142 mmol/L (ref 135–145)

## 2022-10-28 NOTE — Progress Notes (Signed)
Physical Therapy Treatment Patient Details Name: Crystal [name] MRN: [id number] DOB: 1961/08/21 Today's Date: 10/28/2022   History of Present Illness 61 yo s/p R DA THA. PMH: fibromyalgia, DM, chronic fatigue syndrome, L5-S1    PT Comments    Patient able to stand with min assist and +2 for safety, she requires extra time and effort and significant encouragement. Pt able to take small steps to [facility] with mod assist to facilitate weight shift for improved step length and was able to void bladder. Pt agreeable to ambulation out of room and Mod assist with cues to increased dorsiflexion Rt and increased step length Lt improved ability to advance Rt LE for gait. Distance limited by pain and fatigue and EOS pt resting in recliner. Will continue to progress as able.   Recommendations for follow up therapy are one component of a multi-disciplinary discharge planning process, led by the attending physician.  Recommendations may be updated based on patient status, additional functional criteria and insurance authorization.  Follow Up Recommendations  Follow physician's recommendations for discharge plan and follow up therapies     Assistance Recommended at Discharge Frequent or constant Supervision/Assistance  Patient can return home with the following A little help with walking and/or transfers;A little help with bathing/dressing/bathroom;Help with stairs or ramp for entrance;Assist for transportation;Assistance with cooking/housework   Equipment Recommendations  None recommended by PT    Recommendations for Other Services       Precautions / Restrictions Precautions Precautions: Fall Restrictions Weight Bearing Restrictions: No Other Position/Activity Restrictions: WBAT     Mobility  Bed Mobility Overal bed mobility: Needs Assistance Bed Mobility: Supine to Sit     Supine to sit: +2 for safety/equipment, HOB elevated, Max assist     General bed mobility comments: Max assist with  use of pad to pivot hips and assist to control Rt LE off EOB.    Transfers Overall transfer level: Needs assistance Equipment used: Rolling walker (2 wheels) Transfers: Sit to/from Stand, Bed to chair/wheelchair/BSC Sit to Stand: Min assist, From elevated surface, +2 safety/equipment   Step pivot transfers: Mod assist, +2 physical assistance, From elevated surface, +2 safety/equipment       General transfer comment: Min assist to power up from recliner and BSC with +2 for safety. pt able to initaite and rise with extra time.    Ambulation/Gait Ambulation/Gait assistance: Mod assist, +2 safety/equipment Gait Distance (Feet): 7 Feet Assistive device: Rolling walker (2 wheels) Gait Pattern/deviations: Step-through pattern, Decreased stride length, Shuffle, Decreased dorsiflexion - right, Decreased step length - right, Decreased stance time - right Gait velocity: decr     General Gait Details: Mod assist to steady walker and facilitate weight shift Rt/Lt to move chair>BSC. cues for dorsiflexion on Rt LE and assist at start to advance Rt LE. Cues for increased step length on Lt to facilitate Rt LE advancement by loading hip flexor. pt required standing rest at ~6' and then completed AROM for knee flexion on Rt to reduce tightness at hip. Pt too 2 more small steps and seated rest provided.   Stairs             Wheelchair Mobility    Modified Rankin (Stroke Patients Only)       Balance Overall balance assessment: Needs assistance Sitting-balance support: Single extremity supported, Bilateral upper extremity supported, Feet supported Sitting balance-Leahy Scale: Fair     Standing balance support: Reliant on assistive device for balance, During functional activity Standing balance-Leahy Scale: Poor  Cognition Arousal/Alertness: Awake/alert Behavior During Therapy: WFL for tasks assessed/performed Overall Cognitive Status: Within  Functional Limits for tasks assessed                                          Exercises Total Joint Exercises Ankle Circles/Pumps: AROM, Both, 10 reps Knee Flexion: AROM, 5 reps, Right, Standing    General Comments        Pertinent Vitals/Pain Pain Assessment Pain Assessment: Faces Faces Pain Scale: Hurts whole lot Pain Location: right hip Pain Descriptors / Indicators: Burning, Discomfort, Moaning, Sore, Throbbing Pain Intervention(s): Limited activity within patient's tolerance, Monitored during session, Repositioned, Ice applied, Heat applied (heat to back)    Home Living                          Prior Function            PT Goals (current goals can now be found in the care plan section) Acute Rehab PT Goals Patient Stated Goal: less pain PT Goal Formulation: With patient Time For Goal Achievement: 11/10/22 Potential to Achieve Goals: Fair Progress towards PT goals: Progressing toward goals (slow)    Frequency    7X/week      PT Plan Current plan remains appropriate    Co-evaluation              AM-PAC PT "6 Clicks" Mobility   Outcome Measure  Help needed turning from your back to your side while in a flat bed without using bedrails?: A Lot Help needed moving from lying on your back to sitting on the side of a flat bed without using bedrails?: A Lot Help needed moving to and from a bed to a chair (including a wheelchair)?: Total Help needed standing up from a chair using your arms (e.g., wheelchair or bedside chair)?: A Lot Help needed to walk in hospital room?: Total Help needed climbing 3-5 steps with a railing? : Total 6 Click Score: 9    End of Session Equipment Utilized During Treatment: Gait belt Activity Tolerance: Patient tolerated treatment well Patient left: in chair;with call bell/phone within reach;with chair alarm set;with family/visitor present Nurse Communication: Mobility status PT Visit Diagnosis:  Other abnormalities of gait and mobility (R26.89);Difficulty in walking, not elsewhere classified (R26.2)     Time: 1414-1456 (2 minutes toileting) PT Time Calculation (min) (ACUTE ONLY): 42 min  Charges:  $Gait Training: 8-22 mins $Therapeutic Activity: 23-37 mins                     Verner Mould, DPT Acute Rehabilitation Services Office [phone]  10/28/22 3:49 PM

## 2022-10-28 NOTE — TOC Transition Note (Signed)
Transition of Care [facility]) - CM/SW Discharge Note   Patient Details  Name: DAYLEE DELAHOZ MRN: [id number] Date of Birth: [birth year]  Transition of [facility]) CM/SW Contact:  Lennart Pall, LCSW Phone Number: 10/28/2022, 10:29 AM   Clinical Narrative:     Met with pt and spouse and confirming she has needed DME at home.  Plan for HEP.  No TOC needs.  Final next level of care: Home/Self Care Barriers to Discharge: No Barriers Identified   Patient Goals and CMS Choice Patient states their goals for this hospitalization and ongoing recovery are:: return home      Discharge Placement                       Discharge Plan and Services                DME Arranged: N/A DME Agency: NA                  Social Determinants of Health (SDOH) Interventions     Readmission Risk Interventions     No data to display

## 2022-10-28 NOTE — Plan of Care (Signed)
  Problem: Activity: Goal: Ability to avoid complications of mobility impairment will improve Outcome: Progressing   

## 2022-10-28 NOTE — Plan of Care (Signed)
  Problem: Activity: Goal: Ability to avoid complications of mobility impairment will improve Outcome: Progressing Goal: Ability to tolerate increased activity will improve Outcome: Progressing   Problem: Pain Management: Goal: Pain level will decrease with appropriate interventions Outcome: Progressing   

## 2022-10-28 NOTE — Progress Notes (Signed)
Physical Therapy Treatment Patient Details Name: Crystal [name] MRN: [id number] DOB: September 29, 1961 Today's Date: 10/28/2022   History of Present Illness 61 yo s/p R DA THA. PMH: fibromyalgia, DM, chronic fatigue syndrome, L5-S1    PT Comments    Patient premedicated prior to therapy and pt reported some improvement in pain. Pt required Max x1 assist with use of bed pad to pivot to EOB. Pt required Min +2 for sit<>stand today from elevated surface and after ~1 minute c/o dizziness. Symptoms resolved with seated rest and pt able to stand and step to recliner with Mod +2 assist. Completed pre-gait weight shifting Rt/Lt standing in front of recliner with 5 reps each direction. EOS pt instructed on ankle pumps and resting in recliner. Will continue to progress as able.   Recommendations for follow up therapy are one component of a multi-disciplinary discharge planning process, led by the attending physician.  Recommendations may be updated based on patient status, additional functional criteria and insurance authorization.  Follow Up Recommendations  Follow physician's recommendations for discharge plan and follow up therapies     Assistance Recommended at Discharge Frequent or constant Supervision/Assistance  Patient can return home with the following A little help with walking and/or transfers;A little help with bathing/dressing/bathroom;Help with stairs or ramp for entrance;Assist for transportation;Assistance with cooking/housework   Equipment Recommendations  None recommended by PT    Recommendations for Other Services       Precautions / Restrictions Precautions Precautions: Fall Restrictions Weight Bearing Restrictions: No Other Position/Activity Restrictions: WBAT     Mobility  Bed Mobility Overal bed mobility: Needs Assistance Bed Mobility: Supine to Sit     Supine to sit: Mod assist, +2 for safety/equipment, HOB elevated    Max assist with use of pad to pivot hips and  assist to control Rt LE off EOB.       Transfers Overall transfer level: Needs assistance Equipment used: Rolling walker (2 wheels) Transfers: Sit to/from Stand, Bed to chair/wheelchair/BSC Sit to Stand: Min assist, From elevated surface, +2 safety/equipment   Step pivot transfers: Mod assist, +2 physical assistance, From elevated surface, +2 safety/equipment      Min assist for power up from elevated EOB. Mod +2 to guide RW and weight shift for steps to move bed>chair.       Ambulation/Gait                   Stairs             Wheelchair Mobility    Modified Rankin (Stroke Patients Only)       Balance Overall balance assessment: Needs assistance Sitting-balance support: Single extremity supported, Bilateral upper extremity supported, Feet supported Sitting balance-Leahy Scale: Fair     Standing balance support: Reliant on assistive device for balance, During functional activity Standing balance-Leahy Scale: Poor                              Cognition Arousal/Alertness: Awake/alert Behavior During Therapy: WFL for tasks assessed/performed Overall Cognitive Status: Within Functional Limits for tasks assessed                                          Exercises Total Joint Exercises Ankle Circles/Pumps: AROM, Both, 10 reps    General Comments        Pertinent Vitals/Pain  Pain Assessment Pain Assessment: Faces Faces Pain Scale: Hurts whole lot Pain Location: right hip Pain Descriptors / Indicators: Burning, Discomfort, Moaning, Sore, Throbbing Pain Intervention(s): Limited activity within patient's tolerance, Monitored during session, Repositioned, Premedicated before session, Ice applied    Home Living                          Prior Function            PT Goals (current goals can now be found in the care plan section) Acute Rehab PT Goals Patient Stated Goal: less pain PT Goal Formulation: With  patient Time For Goal Achievement: 11/10/22 Potential to Achieve Goals: Fair Progress towards PT goals: Progressing toward goals    Frequency    7X/week      PT Plan Current plan remains appropriate    Co-evaluation              AM-PAC PT "6 Clicks" Mobility   Outcome Measure  Help needed turning from your back to your side while in a flat bed without using bedrails?: A Lot Help needed moving from lying on your back to sitting on the side of a flat bed without using bedrails?: A Lot Help needed moving to and from a bed to a chair (including a wheelchair)?: Total Help needed standing up from a chair using your arms (e.g., wheelchair or bedside chair)?: A Lot Help needed to walk in hospital room?: Total Help needed climbing 3-5 steps with a railing? : Total 6 Click Score: 9    End of Session Equipment Utilized During Treatment: Gait belt Activity Tolerance: Patient tolerated treatment well Patient left: in chair;with call bell/phone within reach;with chair alarm set;with family/visitor present Nurse Communication: Mobility status PT Visit Diagnosis: Other abnormalities of gait and mobility (R26.89);Difficulty in walking, not elsewhere classified (R26.2)     Time: 8315-1761 PT Time Calculation (min) (ACUTE ONLY): 32 min  Charges:  $Therapeutic Activity: 23-37 mins                     Verner Mould, DPT Acute Rehabilitation Services Office [phone]  10/28/22 1:10 PM

## 2022-10-28 NOTE — Progress Notes (Signed)
Subjective: 1 Day Post-Op Procedure(s) (LRB): TOTAL HIP ARTHROPLASTY ANTERIOR APPROACH (Right) Patient seen in rounds by Dr. Wynelle Link. Patient is well, and has had no acute complaints or problems. Denies SOB or chest pain. Denies calf pain. Patient reports pain as moderate. We will continue physical therapy today.   Objective: Vital signs in last 24 hours: Temp:  [97.6 F (36.4 C)-98.3 F (36.8 C)] 98.3 F (36.8 C) (11/16 0558) Pulse Rate:  [70-93] 92 (11/16 0558) Resp:  [10-18] 18 (11/16 0558) BP: (117-188)/(65-84) 140/68 (11/16 0558) SpO2:  [95 %-100 %] 98 % (11/16 0558) Weight:  [118.8 kg] 118.8 kg (11/15 0713)  Intake/Output from previous day:  Intake/Output Summary (Last 24 hours) at 10/28/2022 0710 Last data filed at 10/28/2022 0600 Gross per 24 hour  Intake 3322.5 ml  Output 2700 ml  Net 622.5 ml     Intake/Output this shift: No intake/output data recorded.  Labs: Recent Labs    10/28/22 0304  HGB 12.5   Recent Labs    10/28/22 0304  WBC 16.5*  RBC 4.69  HCT 40.3  PLT 248   Recent Labs    10/28/22 0304  NA 142  K 3.9  CL 104  CO2 29  BUN 6*  CREATININE 0.76  GLUCOSE 134*  CALCIUM 8.6*   No results for input(s): "LABPT", "INR" in the last 72 hours.  Exam: General - Patient is Alert and Oriented Extremity - Neurologically intact Neurovascular intact Sensation intact distally Dorsiflexion/Plantar flexion intact Dressing - dressing C/D/I Motor Function - intact, moving foot and toes well on exam.  Past Medical History:  Diagnosis Date   Asthma    Activity induced; improved , reports 08-06-2019 breathing now stable per patient report    Chronic fatigue syndrome    Chronic myofascial pain 1997   Dr Jason Fila   Complication of anesthesia    pseudocolonesterase deficiency   DDD (degenerative disc disease), lumbar [phone]   Dr Jenny Reichmann Rendall   Diabetes mellitus    type 2 , checks daily before breakfast     Endometriosis     Endometriosis    Family history of adverse reaction to anesthesia    Fibromyalgia 1997   Dr Jason Fila   Hypothyroidism    IBS (irritable bowel syndrome)    Interstitial and deep keratitis    Interstitial cystitis    Dr Alona Bene   Lumbago    Morbid obesity ([place])    Osteoarthritis of lumbar spine 2018   Dr Wandra Feinstein   Peripheral neuropathy    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[place])    hadnt had sx of this in over a year    Reflex sympathetic dystrophy    Reflex sympathetic dystrophy of right lower extremity 2002   Right toe- Dr Jason Fila;  "hasnt bothered me in a few years"    Seizures ([place])    last seizure was about 8 months ago, unsure what triggered it , say she was hospitalized at [facility] but encounter not seen in epic , does not take any seizure medication at this time , denies reoccurrence of seizure episodes since that time    Sleep apnea    no device at this time   Thyroid disease    Wears eyeglasses    Wheelchair dependent     Assessment/Plan: 1 Day Post-Op Procedure(s) (LRB): TOTAL HIP ARTHROPLASTY ANTERIOR APPROACH (Right) Principal Problem:   OA (osteoarthritis) of hip Active Problems:   Primary osteoarthritis of right hip  Estimated body mass  index is 39.84 kg/m as calculated from the following:   Height as of this encounter: '5\' 8"'$  (1.727 m).   Weight as of this encounter: 118.8 kg. Advance diet Up with therapy D/C IV fluids  DVT Prophylaxis - Xarelto Weight bearing as tolerated.  Continue physical therapy. She was significantly deconditioned prior to surgery and will be slower to progress. Will require additional night(s) in hospital to maximize mobility and for pain control.  R. Jaynie Bream, PA-C Orthopedic Surgery [phone] 10/28/2022, 7:10 AM

## 2022-10-29 LAB — CBC
HCT: 37.7 % (ref 36.0–46.0)
Hemoglobin: 11.4 g/dL — ABNORMAL LOW (ref 12.0–15.0)
MCH: 26.1 pg (ref 26.0–34.0)
MCHC: 30.2 g/dL (ref 30.0–36.0)
MCV: 86.3 fL (ref 80.0–100.0)
Platelets: 212 10*3/uL (ref 150–400)
RBC: 4.37 MIL/uL (ref 3.87–5.11)
RDW: 15 % (ref 11.5–15.5)
WBC: 15.1 10*3/uL — ABNORMAL HIGH (ref 4.0–10.5)
nRBC: 0 % (ref 0.0–0.2)

## 2022-10-29 MED ORDER — OXYCODONE HCL 5 MG PO TABS: 5.0000 mg | ORAL_TABLET | Freq: Four times a day (QID) | ORAL | 0 refills | Status: DC | PRN

## 2022-10-29 MED ORDER — RIVAROXABAN 10 MG PO TABS: 10.0000 mg | ORAL_TABLET | Freq: Every day | ORAL | 0 refills | Status: AC

## 2022-10-29 MED ORDER — METHOCARBAMOL 500 MG PO TABS: 500.0000 mg | ORAL_TABLET | Freq: Four times a day (QID) | ORAL | 0 refills | Status: AC | PRN

## 2022-10-29 NOTE — Plan of Care (Signed)
  Problem: Activity: Goal: Ability to avoid complications of mobility impairment will improve Outcome: Progressing Goal: Ability to tolerate increased activity will improve Outcome: Progressing   Problem: Pain Management: Goal: Pain level will decrease with appropriate interventions Outcome: Progressing   

## 2022-10-29 NOTE — Progress Notes (Signed)
Physical Therapy Treatment Patient Details Name: Crystal [name] MRN: [id number] DOB: 03-Jan-1961 Today's Date: 10/29/2022   History of Present Illness 61 yo s/p R DA THA. PMH: fibromyalgia, DM, chronic fatigue syndrome, L5-S1    PT Comments    Patient OOB on BSC at start of session. Mod assist for transfers this session, pt able to complete pericare in standing with min assist to steady. Set up provided and pt able to complete wash up seated EOB. Agreeable to ambulate after wash up and pt increased distance to ~15' with Mod assist for Rt LE advancement and walker management. EOS pt resting in recliner and mepilex dressing applied to Rt groin as pt reports slight skin tear there. Will continue to progress as able.   Recommendations for follow up therapy are one component of a multi-disciplinary discharge planning process, led by the attending physician.  Recommendations may be updated based on patient status, additional functional criteria and insurance authorization.  Follow Up Recommendations  Follow physician's recommendations for discharge plan and follow up therapies     Assistance Recommended at Discharge Frequent or constant Supervision/Assistance  Patient can return home with the following A little help with walking and/or transfers;A little help with bathing/dressing/bathroom;Help with stairs or ramp for entrance;Assist for transportation;Assistance with cooking/housework   Equipment Recommendations  None recommended by PT    Recommendations for Other Services       Precautions / Restrictions Precautions Precautions: Fall Restrictions Weight Bearing Restrictions: No Other Position/Activity Restrictions: WBAT     Mobility  Bed Mobility               General bed mobility comments: pt OOB in recliner    Transfers Overall transfer level: Needs assistance Equipment used: Rolling walker (2 wheels) Transfers: Sit to/from Stand Sit to Stand: Min assist            General transfer comment: Cues for hand placement for bil UE to power up from armrests. pt able to initiate but min assist to complete from recliner and BSC.    Ambulation/Gait Ambulation/Gait assistance: Mod assist Gait Distance (Feet): 15 Feet Assistive device: Rolling walker (2 wheels) Gait Pattern/deviations: Step-through pattern, Decreased stride length, Shuffle, Decreased dorsiflexion - right, Decreased step length - right, Decreased stance time - right Gait velocity: decr     General Gait Details: Mod assist to steady walker and mod cues for step pattern. Pt requried assist to advance wlaker farther forward for incresed Lt stand to load Rt LE for advancement. no LOB throughout.   Stairs             Wheelchair Mobility    Modified Rankin (Stroke Patients Only)       Balance Overall balance assessment: Needs assistance Sitting-balance support: Single extremity supported, Bilateral upper extremity supported, Feet supported Sitting balance-Leahy Scale: Fair     Standing balance support: Reliant on assistive device for balance, During functional activity Standing balance-Leahy Scale: Poor                              Cognition Arousal/Alertness: Awake/alert Behavior During Therapy: WFL for tasks assessed/performed Overall Cognitive Status: Within Functional Limits for tasks assessed                                          Exercises Total Joint Exercises Ankle Circles/Pumps:  AROM, Both, 10 reps    General Comments        Pertinent Vitals/Pain Pain Assessment Pain Assessment: Faces Faces Pain Scale: Hurts whole lot Pain Location: right hip Pain Descriptors / Indicators: Burning, Discomfort, Moaning, Sore, Throbbing Pain Intervention(s): Limited activity within patient's tolerance, Monitored during session, Repositioned, Ice applied    Home Living                          Prior Function            PT  Goals (current goals can now be found in the care plan section) Acute Rehab PT Goals Patient Stated Goal: less pain PT Goal Formulation: With patient Time For Goal Achievement: 11/10/22 Potential to Achieve Goals: Fair Progress towards PT goals: Progressing toward goals    Frequency    7X/week      PT Plan Current plan remains appropriate    Co-evaluation              AM-PAC PT "6 Clicks" Mobility   Outcome Measure  Help needed turning from your back to your side while in a flat bed without using bedrails?: A Lot Help needed moving from lying on your back to sitting on the side of a flat bed without using bedrails?: A Lot Help needed moving to and from a bed to a chair (including a wheelchair)?: A Little Help needed standing up from a chair using your arms (e.g., wheelchair or bedside chair)?: A Little Help needed to walk in hospital room?: A Lot Help needed climbing 3-5 steps with a railing? : Total 6 Click Score: 13    End of Session Equipment Utilized During Treatment: Gait belt Activity Tolerance: Patient tolerated treatment well Patient left: in chair;with call bell/phone within reach;with chair alarm set;with family/visitor present Nurse Communication: Mobility status PT Visit Diagnosis: Other abnormalities of gait and mobility (R26.89);Difficulty in walking, not elsewhere classified (R26.2)     Time: 4462-8638 PT Time Calculation (min) (ACUTE ONLY): 39 min  Charges:  $Gait Training: 8-22 mins $Therapeutic Activity: 23-37 mins                     Verner Mould, DPT Acute Rehabilitation Services Office [phone]  10/29/22 3:39 PM

## 2022-10-29 NOTE — Progress Notes (Signed)
Chaplain received a consult that patient wanted information on advance directives and HCPOA.  Patient was unaware of the request and declined speaking further at this time.  [address], [place] Pager, [phone]

## 2022-10-29 NOTE — Progress Notes (Signed)
Physical Therapy Treatment Patient Details Name: Crystal [name] MRN: [id number] DOB: 1961-03-28 Today's Date: 10/29/2022   History of Present Illness 61 yo s/p R DA THA. PMH: fibromyalgia, DM, chronic fatigue syndrome, L5-S1    PT Comments    Patient resting in recliner and requesting assist to [facility]. Min guard only for power up from recliner and BSC, pt taking extra time to power up but steady once standing. Min assist for gait this visit and pt able to increase distance to ~55' with RW and demonstrated improved recall to take long step with Lt to facilitate Rt LE advancement. EOS pt resting in recliner and completed Hep for ROM and strengthening. Will continue to progress as able in acute setting.    Recommendations for follow up therapy are one component of a multi-disciplinary discharge planning process, led by the attending physician.  Recommendations may be updated based on patient status, additional functional criteria and insurance authorization.  Follow Up Recommendations  Follow physician's recommendations for discharge plan and follow up therapies     Assistance Recommended at Discharge Frequent or constant Supervision/Assistance  Patient can return home with the following A little help with walking and/or transfers;A little help with bathing/dressing/bathroom;Help with stairs or ramp for entrance;Assist for transportation;Assistance with cooking/housework   Equipment Recommendations  None recommended by PT    Recommendations for Other Services       Precautions / Restrictions Precautions Precautions: Fall Restrictions Weight Bearing Restrictions: No Other Position/Activity Restrictions: WBAT     Mobility  Bed Mobility               General bed mobility comments: pt OOB in recliner    Transfers Overall transfer level: Needs assistance Equipment used: Rolling walker (2 wheels) Transfers: Sit to/from Stand Sit to Stand: Min guard           General  transfer comment: pt with good recall for safe technique to rise from recliner and BSC. Min guard for safety with power up.    Ambulation/Gait Ambulation/Gait assistance: Min assist Gait Distance (Feet): 55 Feet Assistive device: Rolling walker (2 wheels) Gait Pattern/deviations: Step-through pattern, Decreased stride length, Shuffle, Decreased dorsiflexion - right, Decreased step length - right, Decreased stance time - right Gait velocity: decr     General Gait Details: pt with good recall for step pattern. Assist to advance walker forward greater distance to allow fo rlonger Lt step length; pt improved ability to conitnue long step length over time. Assist needed to direct walker in hallway. pt's spouse following with recliner.   Stairs             Wheelchair Mobility    Modified Rankin (Stroke Patients Only)       Balance Overall balance assessment: Needs assistance Sitting-balance support: Single extremity supported, Bilateral upper extremity supported, Feet supported Sitting balance-Leahy Scale: Fair     Standing balance support: Reliant on assistive device for balance, During functional activity Standing balance-Leahy Scale: Poor                              Cognition Arousal/Alertness: Awake/alert Behavior During Therapy: WFL for tasks assessed/performed Overall Cognitive Status: Within Functional Limits for tasks assessed                                          Exercises Total  Joint Exercises Ankle Circles/Pumps: AROM, Both, 10 reps Heel Slides: AAROM, Right, 10 reps Long Arc Quad: AROM, Right, 10 reps    General Comments        Pertinent Vitals/Pain Pain Assessment Pain Assessment: Faces Faces Pain Scale: Hurts whole lot Pain Location: right hip Pain Descriptors / Indicators: Burning, Discomfort, Moaning, Sore, Throbbing Pain Intervention(s): Limited activity within patient's tolerance, Monitored during session,  Repositioned, Ice applied    Home Living                          Prior Function            PT Goals (current goals can now be found in the care plan section) Acute Rehab PT Goals Patient Stated Goal: less pain PT Goal Formulation: With patient Time For Goal Achievement: 11/10/22 Potential to Achieve Goals: Fair Progress towards PT goals: Progressing toward goals    Frequency    7X/week      PT Plan Current plan remains appropriate    Co-evaluation              AM-PAC PT "6 Clicks" Mobility   Outcome Measure  Help needed turning from your back to your side while in a flat bed without using bedrails?: A Lot Help needed moving from lying on your back to sitting on the side of a flat bed without using bedrails?: A Lot Help needed moving to and from a bed to a chair (including a wheelchair)?: A Little Help needed standing up from a chair using your arms (e.g., wheelchair or bedside chair)?: A Little Help needed to walk in hospital room?: A Little Help needed climbing 3-5 steps with a railing? : Total 6 Click Score: 14    End of Session Equipment Utilized During Treatment: Gait belt Activity Tolerance: Patient tolerated treatment well Patient left: in chair;with call bell/phone within reach;with chair alarm set;with family/visitor present Nurse Communication: Mobility status PT Visit Diagnosis: Other abnormalities of gait and mobility (R26.89);Difficulty in walking, not elsewhere classified (R26.2)     Time: 7628-3151 PT Time Calculation (min) (ACUTE ONLY): 34 min  Charges:  $Gait Training: 8-22 mins $Therapeutic Activity: 8-22 mins                     Verner Mould, DPT Acute Rehabilitation Services Office [phone]  10/29/22 3:59 PM

## 2022-10-29 NOTE — Progress Notes (Signed)
Subjective: 2 Days Post-Op Procedure(s) (LRB): TOTAL HIP ARTHROPLASTY ANTERIOR APPROACH (Right) Patient reports pain as mild.   Patient seen in rounds by Dr. Wynelle Link. Patient is feeling better this AM, reports burning in the hip yesterday with therapy. Ambulated 6' with second session. No issues overnight.  Plan is to go Home after hospital stay.  Objective: Vital signs in last 24 hours: Temp:  [97.6 F (36.4 C)-98.3 F (36.8 C)] 98.3 F (36.8 C) (11/17 0618) Pulse Rate:  [82-98] 96 (11/17 0618) Resp:  [16-18] 17 (11/17 0618) BP: (134-153)/(65-82) 134/82 (11/17 0618) SpO2:  [98 %-100 %] 100 % (11/17 0618)  Intake/Output from previous day:  Intake/Output Summary (Last 24 hours) at 10/29/2022 0748 Last data filed at 10/29/2022 0740 Gross per 24 hour  Intake 895 ml  Output 900 ml  Net -5 ml    Intake/Output this shift: No intake/output data recorded.  Labs: Recent Labs    10/28/22 0304 10/29/22 0314  HGB 12.5 11.4*   Recent Labs    10/28/22 0304 10/29/22 0314  WBC 16.5* 15.1*  RBC 4.69 4.37  HCT 40.3 37.7  PLT 248 212   Recent Labs    10/28/22 0304  NA 142  K 3.9  CL 104  CO2 29  BUN 6*  CREATININE 0.76  GLUCOSE 134*  CALCIUM 8.6*   No results for input(s): "LABPT", "INR" in the last 72 hours.  Exam: General - Patient is Alert and Oriented Extremity - Neurologically intact Neurovascular intact Sensation intact distally Dorsiflexion/Plantar flexion intact Dressing/Incision - clean, dry, no drainage Motor Function - intact, moving foot and toes well on exam.   Past Medical History:  Diagnosis Date   Asthma    Activity induced; improved , reports 08-06-2019 breathing now stable per patient report    Chronic fatigue syndrome    Chronic myofascial pain 1997   Dr Jason Fila   Complication of anesthesia    pseudocolonesterase deficiency   DDD (degenerative disc disease), lumbar [phone]   Dr Jenny Reichmann Rendall   Diabetes mellitus    type 2 , checks  daily before breakfast     Endometriosis    Endometriosis    Family history of adverse reaction to anesthesia    Fibromyalgia 1997   Dr Jason Fila   Hypothyroidism    IBS (irritable bowel syndrome)    Interstitial and deep keratitis    Interstitial cystitis    Dr Alona Bene   Lumbago    Morbid obesity ([place])    Osteoarthritis of lumbar spine 2018   Dr Wandra Feinstein   Peripheral neuropathy    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[place])    hadnt had sx of this in over a year    Reflex sympathetic dystrophy    Reflex sympathetic dystrophy of right lower extremity 2002   Right toe- Dr Jason Fila;  "hasnt bothered me in a few years"    Seizures ([place])    last seizure was about 8 months ago, unsure what triggered it , say she was hospitalized at [facility] but encounter not seen in epic , does not take any seizure medication at this time , denies reoccurrence of seizure episodes since that time    Sleep apnea    no device at this time   Thyroid disease    Wears eyeglasses    Wheelchair dependent     Assessment/Plan: 2 Days Post-Op Procedure(s) (LRB): TOTAL HIP ARTHROPLASTY ANTERIOR APPROACH (Right) Principal Problem:   OA (osteoarthritis) of hip Active Problems:  Primary osteoarthritis of right hip  Estimated body mass index is 39.84 kg/m as calculated from the following:   Height as of this encounter: '5\' 8"'$  (1.727 m).   Weight as of this encounter: 118.8 kg. Up with therapy  DVT Prophylaxis - Xarelto Weight-bearing as tolerated  Plan for discharge to home once cleared with therapy.   Theresa Duty, PA-C Orthopedic Surgery [phone] 10/29/2022, 7:48 AM

## 2022-10-30 LAB — GLUCOSE, CAPILLARY: Glucose-Capillary: 106 mg/dL — ABNORMAL HIGH (ref 70–99)

## 2022-10-30 NOTE — Progress Notes (Signed)
Subjective: 3 Days Post-Op Procedure(s) (LRB): TOTAL HIP ARTHROPLASTY ANTERIOR APPROACH (Right) Patient reports pain as mild.   Patient seen in rounds for Dr. Wynelle Link. Patient is sitting up in recliner with family at the bedside. No acute events overnight. Voiding without difficulty. Ambulated 55+ feet with PT yesterday. We will continue therapy today.   Objective: Vital signs in last 24 hours: Temp:  [98.5 F (36.9 C)-99.5 F (37.5 C)] 99 F (37.2 C) (11/18 0602) Pulse Rate:  [83-95] 83 (11/18 0602) Resp:  [16-18] 18 (11/18 0602) BP: (136-160)/(59-71) 142/63 (11/18 0602) SpO2:  [94 %-100 %] 98 % (11/18 0602)  Intake/Output from previous day:  Intake/Output Summary (Last 24 hours) at 10/30/2022 0934 Last data filed at 10/30/2022 0850 Gross per 24 hour  Intake 1560 ml  Output 1450 ml  Net 110 ml     Intake/Output this shift: Total I/O In: 240 [P.O.:240] Out: -   Labs: Recent Labs    10/28/22 0304 10/29/22 0314  HGB 12.5 11.4*   Recent Labs    10/28/22 0304 10/29/22 0314  WBC 16.5* 15.1*  RBC 4.69 4.37  HCT 40.3 37.7  PLT 248 212   Recent Labs    10/28/22 0304  NA 142  K 3.9  CL 104  CO2 29  BUN 6*  CREATININE 0.76  GLUCOSE 134*  CALCIUM 8.6*   No results for input(s): "LABPT", "INR" in the last 72 hours.  Exam: General - Patient is Alert and Oriented Extremity - Neurologically intact Sensation intact distally Intact pulses distally Dorsiflexion/Plantar flexion intact Dressing - dressing C/D/I Motor Function - intact, moving foot and toes well on exam.   Past Medical History:  Diagnosis Date   Asthma    Activity induced; improved , reports 08-06-2019 breathing now stable per patient report    Chronic fatigue syndrome    Chronic myofascial pain 1997   Dr Jason Fila   Complication of anesthesia    pseudocolonesterase deficiency   DDD (degenerative disc disease), lumbar [phone]   Dr Jenny Reichmann Rendall   Diabetes mellitus    type 2 , checks  daily before breakfast     Endometriosis    Endometriosis    Family history of adverse reaction to anesthesia    Fibromyalgia 1997   Dr Jason Fila   Hypothyroidism    IBS (irritable bowel syndrome)    Interstitial and deep keratitis    Interstitial cystitis    Dr Alona Bene   Lumbago    Morbid obesity ([place])    Osteoarthritis of lumbar spine 2018   Dr Wandra Feinstein   Peripheral neuropathy    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[place])    hadnt had sx of this in over a year    Reflex sympathetic dystrophy    Reflex sympathetic dystrophy of right lower extremity 2002   Right toe- Dr Jason Fila;  "hasnt bothered me in a few years"    Seizures ([place])    last seizure was about 8 months ago, unsure what triggered it , say she was hospitalized at [facility] but encounter not seen in epic , does not take any seizure medication at this time , denies reoccurrence of seizure episodes since that time    Sleep apnea    no device at this time   Thyroid disease    Wears eyeglasses    Wheelchair dependent     Assessment/Plan: 3 Days Post-Op Procedure(s) (LRB): TOTAL HIP ARTHROPLASTY ANTERIOR APPROACH (Right) Principal Problem:   OA (osteoarthritis) of hip  Active Problems:   Primary osteoarthritis of right hip  Estimated body mass index is 39.84 kg/m as calculated from the following:   Height as of this encounter: '5\' 8"'$  (1.727 m).   Weight as of this encounter: 118.8 kg. Advance diet Up with therapy D/C IV fluids  DVT Prophylaxis - Xarelto Weight bearing as tolerated.   Plan is to go Home after hospital stay. Plan for discharge today after meeting goals with therapy. Follow up in the office in 2 weeks.   Griffith Citron, PA-C Orthopedic Surgery [phone] 10/30/2022, 9:34 AM

## 2022-10-30 NOTE — Progress Notes (Signed)
Physical Therapy Treatment Patient Details Name: Crystal [name] MRN: [id number] DOB: [birth year] Today's Date: 10/30/2022   History of Present Illness 61 yo s/p R DA THA. PMH: fibromyalgia, DM, chronic fatigue syndrome, L5-S1    PT Comments    Pt participated fairly well. She ambulated ~70 feet with a RW. Again, pt is planning to use her scooter to get into her home. Issued HEP for pt to follow as tolerated (she still has significant pain and has trouble performing some of the exercises). Strongly encouraged her to walk often at home, if nothing else. All PT education completed.     Recommendations for follow up therapy are one component of a multi-disciplinary discharge planning process, led by the attending physician.  Recommendations may be updated based on patient status, additional functional criteria and insurance authorization.  Follow Up Recommendations  Follow physician's recommendations for discharge plan and follow up therapies     Assistance Recommended at Discharge Frequent or constant Supervision/Assistance  Patient can return home with the following A little help with walking and/or transfers;A little help with bathing/dressing/bathroom;Help with stairs or ramp for entrance;Assist for transportation;Assistance with cooking/housework   Equipment Recommendations  None recommended by PT    Recommendations for Other Services       Precautions / Restrictions Precautions Precautions: Fall Restrictions Weight Bearing Restrictions: No RLE Weight Bearing: Weight bearing as tolerated     Mobility  Bed Mobility               General bed mobility comments: pt OOB in recliner    Transfers Overall transfer level: Needs assistance Equipment used: Rolling walker (2 wheels) Transfers: Sit to/from Stand Sit to Stand: Min guard           General transfer comment: Increased time. No physical assistance provided.    Ambulation/Gait Ambulation/Gait assistance:  Min guard Gait Distance (Feet): 70 Feet Assistive device: Rolling walker (2 wheels) Gait Pattern/deviations: Decreased stride length, Decreased step length - right, Trunk flexed       General Gait Details: Cues for safety, posture. Slow gait speed. Improved step lengths/strides and distance increased. Distance limited by pain, fatigue. Spouse followed with recliner.   Stairs             Wheelchair Mobility    Modified Rankin (Stroke Patients Only)       Balance Overall balance assessment: Needs assistance         Standing balance support: Bilateral upper extremity supported, During functional activity, Reliant on assistive device for balance Standing balance-Leahy Scale: Poor                              Cognition Arousal/Alertness: Awake/alert Behavior During Therapy: WFL for tasks assessed/performed Overall Cognitive Status: Within Functional Limits for tasks assessed                                          Exercises Total Joint Exercises Ankle Circles/Pumps: AROM, Both, 10 reps Quad Sets: AROM, Both, 5 reps Heel Slides: AAROM, Right, 5 reps (pt used gait belt to A) Hip ABduction/ADduction: AAROM, Right, 5 reps (pt unable to perform standing hip ABD) Long Arc Quad: AROM, Right, 5 reps Marching in Standing:  (pt unable)    General Comments        Pertinent Vitals/Pain Pain Assessment Pain Assessment: Faces  Faces Pain Scale: Hurts whole lot Pain Location: right hip/thigh Pain Descriptors / Indicators: Burning, Discomfort, Moaning, Sore, Throbbing Pain Intervention(s): Monitored during session, Limited activity within patient's tolerance    Home Living                          Prior Function            PT Goals (current goals can now be found in the care plan section) Progress towards PT goals: Progressing toward goals    Frequency    7X/week      PT Plan Current plan remains appropriate     Co-evaluation              AM-PAC PT "6 Clicks" Mobility   Outcome Measure  Help needed turning from your back to your side while in a flat bed without using bedrails?: A Lot Help needed moving from lying on your back to sitting on the side of a flat bed without using bedrails?: A Lot Help needed moving to and from a bed to a chair (including a wheelchair)?: A Little Help needed standing up from a chair using your arms (e.g., wheelchair or bedside chair)?: A Little Help needed to walk in hospital room?: A Little Help needed climbing 3-5 steps with a railing? : Total 6 Click Score: 14    End of Session Equipment Utilized During Treatment: Gait belt Activity Tolerance: Patient tolerated treatment well;Patient limited by pain;Patient limited by fatigue Patient left: in chair;with call bell/phone within reach;with family/visitor present   PT Visit Diagnosis: Pain;Other abnormalities of gait and mobility (R26.89) Pain - Right/Left: Right Pain - part of body: Hip     Time: 9892-1194 PT Time Calculation (min) (ACUTE ONLY): 17 min  Charges:  $Gait Training: 8-22 mins                         Doreatha Massed, PT Acute Rehabilitation  Office: [phone]

## 2022-10-30 NOTE — Plan of Care (Signed)
Pt ready to DC home with husband 

## 2022-10-30 NOTE — Progress Notes (Signed)
Physical Therapy Treatment Patient Details Name: Crystal [name] MRN: [id number] DOB: Apr 22, 1961 Today's Date: 10/30/2022   History of Present Illness 61 yo s/p R DA THA. PMH: fibromyalgia, DM, chronic fatigue syndrome, L5-S1    PT Comments    Pt agreeable to therapy. Continues to have significant pain with activity. She was able to walk ~70 feet this session. Will plan to have a 2nd session to continue gait training and review exercises. Pt and husband report they are planning to use scooter to get pt into home.     Recommendations for follow up therapy are one component of a multi-disciplinary discharge planning process, led by the attending physician.  Recommendations may be updated based on patient status, additional functional criteria and insurance authorization.  Follow Up Recommendations  Follow physician's recommendations for discharge plan and follow up therapies     Assistance Recommended at Discharge Frequent or constant Supervision/Assistance  Patient can return home with the following A little help with walking and/or transfers;A little help with bathing/dressing/bathroom;Help with stairs or ramp for entrance;Assist for transportation;Assistance with cooking/housework   Equipment Recommendations  None recommended by PT    Recommendations for Other Services       Precautions / Restrictions Precautions Precautions: Fall Restrictions Weight Bearing Restrictions: No RLE Weight Bearing: Weight bearing as tolerated     Mobility  Bed Mobility               General bed mobility comments: pt OOB in recliner    Transfers Overall transfer level: Needs assistance Equipment used: Rolling walker (2 wheels) Transfers: Sit to/from Stand Sit to Stand: Min guard           General transfer comment: Increased time. No physical assistance provided.    Ambulation/Gait Ambulation/Gait assistance: Min guard Gait Distance (Feet): 70 Feet Assistive device: Rolling  walker (2 wheels) Gait Pattern/deviations: Decreased stride length, Decreased step length - right, Trunk flexed       General Gait Details: Cues for safety, posture. Slow gait speed. Improved step lengths/strides and distance increased. Distance limited by pain, fatigue. Spouse followed with recliner.   Stairs             Wheelchair Mobility    Modified Rankin (Stroke Patients Only)       Balance Overall balance assessment: Needs assistance         Standing balance support: Bilateral upper extremity supported, During functional activity, Reliant on assistive device for balance Standing balance-Leahy Scale: Poor                              Cognition Arousal/Alertness: Awake/alert Behavior During Therapy: WFL for tasks assessed/performed Overall Cognitive Status: Within Functional Limits for tasks assessed                                          Exercises      General Comments        Pertinent Vitals/Pain Pain Assessment Pain Assessment: Faces Faces Pain Scale: Hurts whole lot Pain Location: right hip/thigh Pain Descriptors / Indicators: Burning, Discomfort, Moaning, Sore, Throbbing Pain Intervention(s): Limited activity within patient's tolerance, Monitored during session, Repositioned    Home Living                          Prior Function  PT Goals (current goals can now be found in the care plan section) Progress towards PT goals: Progressing toward goals    Frequency    7X/week      PT Plan Current plan remains appropriate    Co-evaluation              AM-PAC PT "6 Clicks" Mobility   Outcome Measure  Help needed turning from your back to your side while in a flat bed without using bedrails?: A Lot Help needed moving from lying on your back to sitting on the side of a flat bed without using bedrails?: A Lot Help needed moving to and from a bed to a chair (including a  wheelchair)?: A Little Help needed standing up from a chair using your arms (e.g., wheelchair or bedside chair)?: A Little Help needed to walk in hospital room?: A Little Help needed climbing 3-5 steps with a railing? : Total 6 Click Score: 14    End of Session Equipment Utilized During Treatment: Gait belt Activity Tolerance: Patient tolerated treatment well;Patient limited by fatigue;Patient limited by pain Patient left: in chair;with call bell/phone within reach;with family/visitor present   PT Visit Diagnosis: Pain;Other abnormalities of gait and mobility (R26.89) Pain - Right/Left: Right Pain - part of body: Hip     Time: 1210-1227 PT Time Calculation (min) (ACUTE ONLY): 17 min  Charges:  $Gait Training: 8-22 mins                        Doreatha Massed, PT Acute Rehabilitation  Office: [phone]

## 2022-11-11 NOTE — Discharge Summary (Signed)
Patient ID: Crystal [name] MRN: [id number] DOB/AGE: April 04, 1961 61 y.o.  Admit date: 10/27/2022 Discharge date: 10/30/2022  Admission Diagnoses:  Principal Problem:   OA (osteoarthritis) of hip Active Problems:   Primary osteoarthritis of right hip   Discharge Diagnoses:  Same  Past Medical History:  Diagnosis Date   Asthma    Activity induced; improved , reports 08-06-2019 breathing now stable per patient report    Chronic fatigue syndrome    Chronic myofascial pain 1997   Dr Jason Fila   Complication of anesthesia    pseudocolonesterase deficiency   DDD (degenerative disc disease), lumbar [id number]   Dr Jenny Reichmann Rendall   Diabetes mellitus    type 2 , checks daily before breakfast     Endometriosis    Endometriosis    Family history of adverse reaction to anesthesia    Fibromyalgia 1997   Dr Jason Fila   Hypothyroidism    IBS (irritable bowel syndrome)    Interstitial and deep keratitis    Interstitial cystitis    Dr Alona Bene   Lumbago    Morbid obesity ([place])    Osteoarthritis of lumbar spine 2018   Dr Wandra Feinstein   Peripheral neuropathy    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[place])    hadnt had sx of this in over a year    Reflex sympathetic dystrophy    Reflex sympathetic dystrophy of right lower extremity 2002   Right toe- Dr Jason Fila;  "hasnt bothered me in a few years"    Seizures ([place])    last seizure was about 8 months ago, unsure what triggered it , say she was hospitalized at [facility] but encounter not seen in epic , does not take any seizure medication at this time , denies reoccurrence of seizure episodes since that time    Sleep apnea    no device at this time   Thyroid disease    Wears eyeglasses    Wheelchair dependent     Surgeries: Procedure(s): TOTAL HIP ARTHROPLASTY ANTERIOR APPROACH on 10/27/2022   Consultants:   Discharged Condition: Improved  Hospital Course: Crystal [name] is an 61 y.o. female who was admitted 10/27/2022 for operative treatment  ofOA (osteoarthritis) of hip. Patient has severe unremitting pain that affects sleep, daily activities, and work/hobbies. After pre-op clearance the patient was taken to the operating room on 10/27/2022 and underwent  Procedure(s): TOTAL HIP ARTHROPLASTY ANTERIOR APPROACH.    Patient was given perioperative antibiotics:  Anti-infectives (From admission, onward)    Start     Dose/Rate Route Frequency Ordered Stop   10/27/22 2200  acyclovir (ZOVIRAX) tablet 400 mg  Status:  Discontinued        400 mg Oral 2 times daily 10/27/22 1342 10/30/22 2150   10/27/22 1500  ceFAZolin (ANCEF) IVPB 2g/100 mL premix        2 g 200 mL/hr over 30 Minutes Intravenous Every 6 hours 10/27/22 1342 10/27/22 2042   10/27/22 0645  ceFAZolin (ANCEF) IVPB 2g/100 mL premix        2 g 200 mL/hr over 30 Minutes Intravenous On call to O.R. 10/27/22 0630 10/27/22 0849        Patient was given sequential compression devices, early ambulation, and chemoprophylaxis to prevent DVT.  Patient benefited maximally from hospital stay and there were no complications.    Recent vital signs: No data found.   Recent laboratory studies: No results for input(s): "WBC", "HGB", "HCT", "PLT", "NA", "K", "CL", "CO2", "BUN", "CREATININE", "GLUCOSE", "INR", "  CALCIUM" in the last 72 hours.  Invalid input(s): "PT", "2"   Discharge Medications:   Allergies as of 10/30/2022       Reactions   Amoxicillin    Severe yeast infection causing lesions and rawness   Celery (apium Graveolens Var. Dulce) Skin Test    Numbness and tingling, bitter taste and prickling on the tongue   Flagyl [metronidazole] Itching, Swelling   Morphine Nausea And Vomiting   Nsaids Nausea Only   Headaches   Other    Metals: Nickel, cat dander, dog dander, hickery, ash, walnut, lambs quarter, dust, ragweed, dustmites, difar, sawdust, tar   Finasteride Itching, Swelling, Rash   redness   Sulfonamide Derivatives Itching, Rash   Blisters in throat          Medication List     TAKE these medications    acetaminophen 650 MG CR tablet Commonly known as: TYLENOL Take 1,300 mg by mouth every 8 (eight) hours as needed for pain.   acyclovir 400 MG tablet Commonly known as: ZOVIRAX Take 400 mg by mouth 2 (two) times daily.   albuterol 108 (90 Base) MCG/ACT inhaler Commonly known as: VENTOLIN HFA Inhale 2 puffs into the lungs every 6 (six) hours as needed for shortness of breath.   baclofen 20 MG tablet Commonly known as: LIORESAL Take 20 mg by mouth 3 (three) times daily.   Cod Liver Oil Caps Take 1 capsule by mouth in the morning and at bedtime.   COLON-AID PO Take 1 tablet by mouth in the morning and at bedtime.   docusate sodium 100 MG capsule Commonly known as: COLACE Take 100 mg by mouth 2 (two) times daily.   EPINEPHrine 0.3 mg/0.3 mL Devi Commonly known as: EPI-PEN Inject 0.3 mg into the muscle once as needed (anaphylaxis).   estrogens (conjugated) 0.45 MG tablet Commonly known as: PREMARIN Take 0.45 mg by mouth daily. Take daily for 21 days then do not take for 7 days.   fexofenadine 180 MG tablet Commonly known as: ALLEGRA Take 180 mg by mouth daily.   glucose 4 GM chewable tablet Chew 1 tablet by mouth as needed for low blood sugar.   ipratropium-albuterol 0.5-2.5 (3) MG/3ML Soln Commonly known as: DUONEB Take 3 mLs by nebulization every 4 (four) hours as needed (asthma).   levothyroxine 50 MCG tablet Commonly known as: SYNTHROID Take 50 mcg by mouth daily before breakfast.   methadone 10 MG tablet Commonly known as: DOLOPHINE Take 10 mg by mouth in the morning and at bedtime.   methocarbamol 500 MG tablet Commonly known as: ROBAXIN Take 1 tablet (500 mg total) by mouth every 6 (six) hours as needed for muscle spasms.   omeprazole 40 MG capsule Commonly known as: PRILOSEC Take 40 mg by mouth daily as needed (acid reflux).   oxyCODONE 5 MG immediate release tablet Commonly known as: Oxy  IR/ROXICODONE Take 1-2 tablets (5-10 mg total) by mouth every 6 (six) hours as needed (breakthrough pain not responding to chronic methadone).   potassium chloride 10 MEQ tablet Commonly known as: KLOR-CON M Take 20 mEq by mouth 2 (two) times daily.   PROBIOTIC PO Take 1 capsule by mouth 3 (three) times daily. Bio X 4   rivaroxaban 10 MG Tabs tablet Commonly known as: XARELTO Take 1 tablet (10 mg total) by mouth daily with breakfast for 21 days.   sennosides-docusate sodium 8.6-50 MG tablet Commonly known as: SENOKOT-S Take 2 tablets by mouth in the morning and at bedtime.  SENOKOT PO Take 2 tablets by mouth daily with lunch.   triamcinolone 0.147 MG/GM topical spray Commonly known as: KENALOG Apply 2-3 sprays topically 3 (three) times a week.   Vitamin D3 50 MCG (2000 UT) capsule Take 2,000 Units by mouth daily.        Diagnostic Studies: DG HIP UNILAT WITH PELVIS 1V RIGHT  Result Date: 10/27/2022 CLINICAL DATA:  Total right hip replacement. Intraoperative fluoroscopy. EXAM: DG HIP (WITH OR WITHOUT PELVIS) 1V RIGHT COMPARISON:  None Available. FINDINGS: Images were performed intraoperatively without the presence of a radiologist. New total right hip arthroplasty hardware. No hardware complication is seen. Total fluoroscopy images: 2 Total fluoroscopy time: 9 seconds Total dose: Radiation Exposure Index (as provided by the fluoroscopic device): 1.328 mGy air Kerma Please see intraoperative findings for further detail. IMPRESSION: Intraoperative fluoroscopy for total right hip arthroplasty. Electronically Signed   By: Yvonne Kendall M.D.   On: 10/27/2022 10:54   DG C-Arm 1-60 Min-No Report  Result Date: 10/27/2022 Fluoroscopy was utilized by the requesting physician.  No radiographic interpretation.   DG C-Arm 1-60 Min-No Report  Result Date: 10/27/2022 Fluoroscopy was utilized by the requesting physician.  No radiographic interpretation.    Disposition: Discharge  disposition: 01-Home or Self Care          Follow-up Information     [name], MD. Schedule an appointment as soon as possible for a visit in 2 week(s).   Specialty: Orthopedic Surgery Contact information: [address] [place] [address] [phone]                  Signed: Theresa Duty 11/11/2022, 4:17 PM

## 2022-11-18 ENCOUNTER — Other Ambulatory Visit (HOSPITAL_COMMUNITY): Payer: Medicare Other

## 2022-12-24 ENCOUNTER — Encounter (HOSPITAL_COMMUNITY): Payer: Self-pay

## 2022-12-24 ENCOUNTER — Other Ambulatory Visit (HOSPITAL_BASED_OUTPATIENT_CLINIC_OR_DEPARTMENT_OTHER): Payer: Self-pay | Admitting: Internal Medicine

## 2022-12-24 ENCOUNTER — Other Ambulatory Visit: Payer: Self-pay

## 2022-12-24 ENCOUNTER — Ambulatory Visit (HOSPITAL_BASED_OUTPATIENT_CLINIC_OR_DEPARTMENT_OTHER)
Admission: RE | Admit: 2022-12-24 | Discharge: 2022-12-24 | Disposition: A | Discharge status: Home / Self Care | Payer: Medicare Other | Source: Ambulatory Visit | Attending: Internal Medicine | Admitting: Internal Medicine

## 2022-12-24 ENCOUNTER — Emergency Department (HOSPITAL_BASED_OUTPATIENT_CLINIC_OR_DEPARTMENT_OTHER)
Admission: EM | Admit: 2022-12-24 | Discharge: 2022-12-24 | Disposition: A | Discharge status: Home / Self Care | Payer: Medicare Other | Attending: Emergency Medicine | Admitting: Emergency Medicine

## 2022-12-24 ENCOUNTER — Encounter (HOSPITAL_BASED_OUTPATIENT_CLINIC_OR_DEPARTMENT_OTHER): Payer: Self-pay | Admitting: Radiology

## 2022-12-24 DIAGNOSIS — M79661 Pain in right lower leg: Secondary | ICD-10-CM | POA: Insufficient documentation

## 2022-12-24 DIAGNOSIS — Z7901 Long term (current) use of anticoagulants: Secondary | ICD-10-CM | POA: Insufficient documentation

## 2022-12-24 DIAGNOSIS — I82411 Acute embolism and thrombosis of right femoral vein: Secondary | ICD-10-CM | POA: Diagnosis not present

## 2022-12-24 DIAGNOSIS — M7989 Other specified soft tissue disorders: Secondary | ICD-10-CM

## 2022-12-24 DIAGNOSIS — M79604 Pain in right leg: Secondary | ICD-10-CM | POA: Diagnosis present

## 2022-12-24 NOTE — ED Provider Notes (Signed)
[facility] EMERGENCY DEPT Provider Note   CSN: [id number] Arrival date & time: 12/24/22  2208     History  Chief Complaint  Patient presents with   blood clot    Crystal [name] is a 62 y.o. female with history right hip replacement presenting to the emergency department with right leg pain and swelling and positive DVT on outpatient ultrasound.  The patient had a right hip replacement performed in November by Dr Hector Shade.  Crystal [name] said a few weeks afterwards Crystal [name] began having pain in her right inguinal region and swelling of her right leg.  Her PCP obtained a vascular ultrasound Today, showing:   MPRESSION: 1. Acute occlusive DVT in the right femoral vein and probable acute occlusive thrombus in the right posterior tibial vein. 2. 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 3. Superficial thrombophlebitis of the left great saphenous vein. 4. Poor visualization of the right popliteal vein and nonvisualization of the right peroneal vein."  PCP Dr Kevan Ny prescribed eliquis but advised patient to come to the ED for further evaluation.  Of note, the patient denies prior history of DVT or PE.  Crystal [name] reports that Crystal [name] was on Xarelto for 3 weeks after her hip surgery as prophylaxis but then came off of it.Marland Kitchen  HPI     Home Medications Prior to Admission medications   Medication Sig Start Date End Date Taking? Authorizing Provider  acetaminophen (TYLENOL) 650 MG CR tablet Take 1,300 mg by mouth every 8 (eight) hours as needed for pain.    [provider]  acyclovir (ZOVIRAX) 400 MG tablet Take 400 mg by mouth 2 (two) times daily.    [provider]  albuterol (PROVENTIL HFA;VENTOLIN HFA) 108 (90 BASE) MCG/ACT inhaler Inhale 2 puffs into the lungs every 6 (six) hours as needed for shortness of breath.     [provider]  baclofen (LIORESAL) 20 MG tablet Take 20 mg by mouth 3 (three) times daily.     [provider]  Cholecalciferol (VITAMIN D3) 50 MCG (2000 UT) capsule Take 2,000 Units by mouth daily.     [provider]  [facility] Liver Oil CAPS Take 1 capsule by mouth in the morning and at bedtime.    [provider]  docusate sodium (COLACE) 100 MG capsule Take 100 mg by mouth 2 (two) times daily.    [provider]  EPINEPHrine (EPI-PEN) 0.3 mg/0.3 mL DEVI Inject 0.3 mg into the muscle once as needed (anaphylaxis).     [provider]  estrogens, conjugated, (PREMARIN) 0.45 MG tablet Take 0.45 mg by mouth daily. Take daily for 21 days then do not take for 7 days.    [provider]  fexofenadine (ALLEGRA) 180 MG tablet Take 180 mg by mouth daily.    [provider]  glucose 4 GM chewable tablet Chew 1 tablet by mouth as needed for low blood sugar.    [provider]  ipratropium-albuterol (DUONEB) 0.5-2.5 (3) MG/3ML SOLN Take 3 mLs by nebulization every 4 (four) hours as needed (asthma).     [provider]  levothyroxine (SYNTHROID, LEVOTHROID) 50 MCG tablet Take 50 mcg by mouth daily before breakfast.     [provider]  methadone (DOLOPHINE) 10 MG tablet Take 10 mg by mouth in the morning and at bedtime.    [provider]  methocarbamol (ROBAXIN) 500 MG tablet Take 1 tablet (500 mg total) by mouth every 6 (six)  hours as needed for muscle spasms. 10/29/22   Edmisten, Ok Anis, PA  Misc Natural Products (COLON-AID PO) Take 1 tablet by mouth in the morning and at bedtime.    [provider]  omeprazole (PRILOSEC) 40 MG capsule Take 40 mg by mouth daily as needed (acid reflux).    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 10/29/22   Edmisten, Kristie L, PA  potassium chloride (K-DUR,KLOR-CON) 10 MEQ tablet Take 20 mEq by mouth 2  (two) times daily.     [provider]  Probiotic Product (PROBIOTIC PO) Take 1 capsule by mouth 3 (three) times daily. Bio X 4    [provider]  Sennosides (SENOKOT PO) Take 2 tablets by mouth daily with lunch.    [provider]  sennosides-docusate sodium (SENOKOT-S) 8.6-50 MG tablet Take 2 tablets by mouth in the morning and at bedtime.    [provider]  triamcinolone (KENALOG) 0.147 MG/GM topical spray Apply 2-3 sprays topically 3 (three) times a week. 09/06/22   [provider]      Allergies    Amoxicillin, Celery (apium graveolens var. dulce) skin test, Flagyl [metronidazole], Morphine, Nsaids, Other, Finasteride, and Sulfonamide derivatives    Review of Systems   Review of Systems  Physical Exam Updated Vital Signs BP 139/72 (BP Location: Right Arm)   Pulse 80   Temp 98.4 F (36.9 C)   Resp 18   Ht '5\' 8"'$  (1.727 m)   Wt 116.1 kg   SpO2 98%   BMI 38.92 kg/m  Physical Exam Constitutional:      General: Crystal [name] is not in acute distress. HENT:     Head: Normocephalic and atraumatic.  Eyes:     Conjunctiva/sclera: Conjunctivae normal.     Pupils: Pupils are equal, round, and reactive to light.  Cardiovascular:     Rate and Rhythm: Normal rate and regular rhythm.  Pulmonary:     Effort: Pulmonary effort is normal. No respiratory distress.  Abdominal:     General: There is no distension.     Tenderness: There is no abdominal tenderness.  Musculoskeletal:     Comments: Right leg swelling, no gross discoloration, no significant discomfort  Skin:    General: Skin is warm and dry.  Neurological:     General: No focal deficit present.     Mental Status: Crystal [name] is alert and oriented to person, place, and time. Mental status is at baseline.  Psychiatric:        Mood and Affect: Mood normal.        Behavior: Behavior normal.     ED Results / Procedures / Treatments   Labs (all labs ordered are listed, but only abnormal results  are displayed) Labs Reviewed - No data to display  EKG None  Radiology US Venous Img Lower Bilateral (DVT)  Result Date: 12/24/2022 CLINICAL DATA:  Bilateral lower extremity edema. Right hip surgery in November. EXAM: BILATERAL LOWER EXTREMITY VENOUS DOPPLER ULTRASOUND TECHNIQUE: Gray-scale sonography with graded compression, as well as color Doppler and duplex ultrasound were performed to evaluate the lower extremity deep venous systems from the level of the common femoral vein and including the common femoral, femoral, profunda femoral, popliteal and calf veins including the posterior tibial, peroneal and gastrocnemius veins when visible. The superficial great saphenous vein was also interrogated. Spectral Doppler  was utilized to evaluate flow at rest and with distal augmentation maneuvers in the common femoral, femoral and popliteal veins. COMPARISON:  None Available. FINDINGS: RIGHT LOWER EXTREMITY Common Femoral Vein: Nonocclusive mural thrombus present which is echogenic and has the appearance of chronic mural thrombus. Saphenofemoral Junction: No evidence of thrombus. Normal compressibility and flow on color Doppler imaging. Profunda Femoral Vein: No evidence of thrombus. Normal compressibility and flow on color Doppler imaging. Femoral Vein: Thrombus present in the right femoral vein which appears to be largely occlusive and likely more acute in appearance compared to the common femoral vein thrombus. Popliteal Vein: The popliteal vein is very poorly visualized and not adequately assessed. Calf Veins: Probable thrombus in the right posterior tibial vein. The peroneal vein is not visualized. Superficial Great Saphenous Vein: No evidence of thrombus. Normal compressibility. Venous Reflux:  None. Other Findings: No evidence of superficial thrombophlebitis or abnormal fluid collection. LEFT LOWER EXTREMITY Common Femoral Vein: No evidence of thrombus. Normal compressibility, respiratory phasicity and  response to augmentation. Saphenofemoral Junction: No evidence of thrombus. Normal compressibility and flow on color Doppler imaging. Profunda Femoral Vein: No evidence of thrombus. Normal compressibility and flow on color Doppler imaging. Femoral Vein: No evidence of thrombus. Normal compressibility, respiratory phasicity and response to augmentation. Popliteal Vein: No evidence of thrombus. Normal compressibility, respiratory phasicity and response to augmentation. Calf Veins: No evidence of thrombus. Normal compressibility and flow on color Doppler imaging. Superficial Great Saphenous Vein: The visualized great saphenous vein demonstrates thrombophlebitis up to nearly the level of the saphenofemoral junction. Venous Reflux:  None. Other Findings:  No abnormal fluid collections. IMPRESSION: 1. Acute occlusive DVT in the right femoral vein and probable acute occlusive thrombus in the right posterior tibial vein. 2. 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 3. Superficial thrombophlebitis of the left great saphenous vein. 4. Poor visualization of the right popliteal vein and nonvisualization of the right peroneal vein. Electronically Signed   By: Aletta Edouard M.D.   On: 12/24/2022 17:00    Procedures Procedures    Medications Ordered in ED Medications - No data to display  ED Course/ Medical Decision Making/ A&P Clinical Course as of 12/24/22 2303  Fri Dec 24, 2022  2246 I spoke to Dr Carlis Abbott from vascular surgery who agrees with plan for DOAC and office referral which will be expedited. [MT]    Clinical Course User Index [MT] Wyvonnia Dusky, MD                             Medical Decision Making  This patient presents to the ED with concern for right leg swelling, found to have acute and possible chronic DVT of the right lower extremity on outpatient ultrasound today.  I reviewed his external records including the ultrasound report.  The DVT does extend  proximally into the common femoral vein per the report.  It is a provoked DVT by her history, likely after her right hip surgery.  Crystal [name] does not have evidence of neurovascular compromise or cerulean Dolan's on exam.    No symptoms or signs of PE  Based on her prior labs patient had normal renal function when last checked in November, creatinine 0.76.  I think Crystal [name] would be a good candidate to be back on Eliquis.  Because this is a proximal clot I did speak to the vascular surgeon on-call, Dr Carlis Abbott, who advised - see ed course  Dispostion:  After consideration of the diagnostic results and the patients response to treatment, I feel that the patent would benefit from close outpatient follow up.         Final Clinical Impression(s) / ED Diagnoses Final diagnoses:  Acute deep vein thrombosis (DVT) of femoral vein of right lower extremity ([place])    Rx / DC Orders ED Discharge Orders          Ordered    AMB Referral to Deep Vein Thrombosis Clinic       Comments: Please call [phone] with any questions.   12/24/22 2250              Wyvonnia Dusky, MD 12/24/22 2303

## 2022-12-24 NOTE — Discharge Instructions (Addendum)
Please begin taking the Eliquis that was prescribed by your primary care doctor tonight.  This is a blood thinning medicine to prevent your blood clot from getting larger.  I placed a referral to our DVT vascular clinic.  If you do not receive a phone call Monday from the clinic, please call the number above for Dr. Carlis Abbott one of the vascular specialist.  Their clinic should likely see you within the week for a follow-up appointment with the referral.

## 2022-12-24 NOTE — Progress Notes (Signed)
Spoke with Dr. Marlou Sa about patient's positive results. MD requested patient contact him for further instructions. Phone number given to patient and she was informed of results and instructed to contact MD ASAP for instructions.   June Leap, RDMS, RVT Sonographer [place] [facility]

## 2022-12-24 NOTE — ED Triage Notes (Signed)
Pt states she was here for a doppler ultrasound today. Was call by Dr. Marlou Sa which is her primary care doctor and was told to come here for vascular consult. Pt with blood clot to the right leg from calf to groin.

## 2022-12-25 ENCOUNTER — Emergency Department (HOSPITAL_BASED_OUTPATIENT_CLINIC_OR_DEPARTMENT_OTHER): Payer: Medicare Other

## 2022-12-25 ENCOUNTER — Encounter (HOSPITAL_COMMUNITY): Payer: Self-pay

## 2022-12-25 ENCOUNTER — Encounter (HOSPITAL_BASED_OUTPATIENT_CLINIC_OR_DEPARTMENT_OTHER): Payer: Self-pay | Admitting: Internal Medicine

## 2022-12-25 ENCOUNTER — Inpatient Hospital Stay (HOSPITAL_BASED_OUTPATIENT_CLINIC_OR_DEPARTMENT_OTHER)
Admission: EM | Admit: 2022-12-25 | Discharge: 2022-12-30 | DRG: 175 | Disposition: A | Discharge status: Home Health | Payer: Medicare Other | Attending: Family Medicine | Admitting: Family Medicine

## 2022-12-25 ENCOUNTER — Other Ambulatory Visit: Payer: Self-pay

## 2022-12-25 DIAGNOSIS — Z96641 Presence of right artificial hip joint: Secondary | ICD-10-CM | POA: Diagnosis present

## 2022-12-25 DIAGNOSIS — Z79899 Other long term (current) drug therapy: Secondary | ICD-10-CM

## 2022-12-25 DIAGNOSIS — G8929 Other chronic pain: Secondary | ICD-10-CM | POA: Diagnosis present

## 2022-12-25 DIAGNOSIS — E1142 Type 2 diabetes mellitus with diabetic polyneuropathy: Secondary | ICD-10-CM | POA: Diagnosis not present

## 2022-12-25 DIAGNOSIS — G4733 Obstructive sleep apnea (adult) (pediatric): Secondary | ICD-10-CM | POA: Diagnosis present

## 2022-12-25 DIAGNOSIS — Z79890 Hormone replacement therapy (postmenopausal): Secondary | ICD-10-CM | POA: Diagnosis not present

## 2022-12-25 DIAGNOSIS — Z993 Dependence on wheelchair: Secondary | ICD-10-CM | POA: Diagnosis not present

## 2022-12-25 DIAGNOSIS — E039 Hypothyroidism, unspecified: Secondary | ICD-10-CM | POA: Diagnosis present

## 2022-12-25 DIAGNOSIS — I2609 Other pulmonary embolism with acute cor pulmonale: Secondary | ICD-10-CM | POA: Diagnosis not present

## 2022-12-25 DIAGNOSIS — Z23 Encounter for immunization: Secondary | ICD-10-CM | POA: Diagnosis not present

## 2022-12-25 DIAGNOSIS — Z888 Allergy status to other drugs, medicaments and biological substances status: Secondary | ICD-10-CM

## 2022-12-25 DIAGNOSIS — Z91018 Allergy to other foods: Secondary | ICD-10-CM | POA: Diagnosis not present

## 2022-12-25 DIAGNOSIS — I824Y1 Acute embolism and thrombosis of unspecified deep veins of right proximal lower extremity: Secondary | ICD-10-CM | POA: Diagnosis not present

## 2022-12-25 DIAGNOSIS — T8131XA Disruption of external operation (surgical) wound, not elsewhere classified, initial encounter: Secondary | ICD-10-CM | POA: Diagnosis present

## 2022-12-25 DIAGNOSIS — I2699 Other pulmonary embolism without acute cor pulmonale: Principal | ICD-10-CM

## 2022-12-25 DIAGNOSIS — Z880 Allergy status to penicillin: Secondary | ICD-10-CM | POA: Diagnosis not present

## 2022-12-25 DIAGNOSIS — Z885 Allergy status to narcotic agent status: Secondary | ICD-10-CM | POA: Diagnosis not present

## 2022-12-25 DIAGNOSIS — Y831 Surgical operation with implant of artificial internal device as the cause of abnormal reaction of the patient, or of later complication, without mention of misadventure at the time of the procedure: Secondary | ICD-10-CM | POA: Diagnosis present

## 2022-12-25 DIAGNOSIS — J45909 Unspecified asthma, uncomplicated: Secondary | ICD-10-CM | POA: Diagnosis present

## 2022-12-25 DIAGNOSIS — E876 Hypokalemia: Secondary | ICD-10-CM | POA: Diagnosis present

## 2022-12-25 DIAGNOSIS — Z886 Allergy status to analgesic agent status: Secondary | ICD-10-CM | POA: Diagnosis not present

## 2022-12-25 DIAGNOSIS — I82411 Acute embolism and thrombosis of right femoral vein: Secondary | ICD-10-CM | POA: Diagnosis present

## 2022-12-25 DIAGNOSIS — E669 Obesity, unspecified: Secondary | ICD-10-CM | POA: Diagnosis not present

## 2022-12-25 DIAGNOSIS — M797 Fibromyalgia: Secondary | ICD-10-CM | POA: Diagnosis present

## 2022-12-25 DIAGNOSIS — N179 Acute kidney failure, unspecified: Secondary | ICD-10-CM | POA: Diagnosis present

## 2022-12-25 DIAGNOSIS — Z7984 Long term (current) use of oral hypoglycemic drugs: Secondary | ICD-10-CM

## 2022-12-25 DIAGNOSIS — I82441 Acute embolism and thrombosis of right tibial vein: Secondary | ICD-10-CM | POA: Diagnosis present

## 2022-12-25 DIAGNOSIS — Z79818 Long term (current) use of other agents affecting estrogen receptors and estrogen levels: Secondary | ICD-10-CM

## 2022-12-25 DIAGNOSIS — Z882 Allergy status to sulfonamides status: Secondary | ICD-10-CM | POA: Diagnosis not present

## 2022-12-25 DIAGNOSIS — Z6841 Body Mass Index (BMI) 40.0 and over, adult: Secondary | ICD-10-CM

## 2022-12-25 DIAGNOSIS — G9332 Myalgic encephalomyelitis/chronic fatigue syndrome: Secondary | ICD-10-CM | POA: Diagnosis present

## 2022-12-25 LAB — PHOSPHORUS
Phosphorus: 3.7 mg/dL (ref 2.5–4.6)
Phosphorus: 3.7 mg/dL (ref 2.5–4.6)

## 2022-12-25 LAB — HIV ANTIBODY (ROUTINE TESTING W REFLEX): HIV Screen 4th Generation wRfx: NONREACTIVE

## 2022-12-25 LAB — TROPONIN I (HIGH SENSITIVITY)
Troponin I (High Sensitivity): 2 ng/L (ref <18)
Troponin I (High Sensitivity): 2 ng/L (ref <18)

## 2022-12-25 LAB — BASIC METABOLIC PANEL
Anion gap: 6 (ref 5–15)
BUN: 10 mg/dL (ref 8–23)
CO2: 33 mmol/L — ABNORMAL HIGH (ref 22–32)
Calcium: 9.6 mg/dL (ref 8.9–10.3)
Chloride: 102 mmol/L (ref 98–111)
Creatinine, Ser: 0.89 mg/dL (ref 0.44–1.00)
GFR, Estimated: >60 mL/min (ref >60)
Glucose, Bld: 95 mg/dL (ref 70–99)
Potassium: 3.8 mmol/L (ref 3.5–5.1)
Sodium: 141 mmol/L (ref 135–145)

## 2022-12-25 LAB — CBC
HCT: 42.5 % (ref 36.0–46.0)
Hemoglobin: 13.2 g/dL (ref 12.0–15.0)
MCH: 24.9 pg — ABNORMAL LOW (ref 26.0–34.0)
MCHC: 31.1 g/dL (ref 30.0–36.0)
MCV: 80 fL (ref 80.0–100.0)
Platelets: 307 10*3/uL (ref 150–400)
RBC: 5.31 MIL/uL — ABNORMAL HIGH (ref 3.87–5.11)
RDW: 14 % (ref 11.5–15.5)
WBC: 7.4 10*3/uL (ref 4.0–10.5)
nRBC: 0 % (ref 0.0–0.2)

## 2022-12-25 LAB — PROTIME-INR
INR: 1.4 — ABNORMAL HIGH (ref 0.8–1.2)
Prothrombin Time: 17 seconds — ABNORMAL HIGH (ref 11.4–15.2)

## 2022-12-25 LAB — MAGNESIUM
Magnesium: 2 mg/dL (ref 1.7–2.4)
Magnesium: 2.1 mg/dL (ref 1.7–2.4)

## 2022-12-25 LAB — BRAIN NATRIURETIC PEPTIDE: B Natriuretic Peptide: 30 pg/mL (ref 0.0–100.0)

## 2022-12-25 MED ORDER — VITAMIN D 25 MCG (1000 UNIT) PO TABS
2000.0000 [IU] | ORAL_TABLET | Freq: Every day | ORAL | Status: DC
  Administered 2022-12-26 – 2022-12-30 (×5): 2000 [IU] via ORAL
  Filled 2022-12-25 (×5): qty 2

## 2022-12-25 MED ORDER — SODIUM CHLORIDE 0.9 % IV SOLN: INTRAVENOUS | Status: AC

## 2022-12-25 MED ORDER — DOCUSATE SODIUM 100 MG PO CAPS
100.0000 mg | ORAL_CAPSULE | Freq: Two times a day (BID) | ORAL | Status: DC
  Administered 2022-12-25 – 2022-12-30 (×10): 100 mg via ORAL
  Filled 2022-12-25 (×10): qty 1

## 2022-12-25 MED ORDER — METHOCARBAMOL 500 MG PO TABS: 500.0000 mg | ORAL_TABLET | Freq: Four times a day (QID) | ORAL | Status: DC | PRN

## 2022-12-25 MED ORDER — APIXABAN 5 MG PO TABS: 5.0000 mg | ORAL_TABLET | Freq: Two times a day (BID) | ORAL | Status: DC

## 2022-12-25 MED ORDER — ONDANSETRON HCL 4 MG/2ML IJ SOLN: 4.0000 mg | Freq: Four times a day (QID) | INTRAMUSCULAR | Status: DC | PRN

## 2022-12-25 MED ORDER — OXYCODONE HCL 5 MG PO TABS
5.0000 mg | ORAL_TABLET | Freq: Four times a day (QID) | ORAL | Status: DC | PRN
  Administered 2022-12-26: 10 mg via ORAL
  Filled 2022-12-25: qty 2

## 2022-12-25 MED ORDER — APIXABAN 5 MG PO TABS
10.0000 mg | ORAL_TABLET | Freq: Two times a day (BID) | ORAL | Status: DC
  Administered 2022-12-25 – 2022-12-30 (×10): 10 mg via ORAL
  Filled 2022-12-25 (×10): qty 2

## 2022-12-25 MED ORDER — HEPARIN (PORCINE) 25000 UT/250ML-% IV SOLN
1600.0000 [IU]/h | INTRAVENOUS | Status: DC
  Filled 2022-12-25: qty 250

## 2022-12-25 MED ORDER — ACETAMINOPHEN 500 MG PO TABS: 1000.0000 mg | ORAL_TABLET | Freq: Three times a day (TID) | ORAL | Status: DC | PRN

## 2022-12-25 MED ORDER — HEPARIN BOLUS VIA INFUSION: 4000.0000 [IU] | Freq: Once | INTRAVENOUS | Status: DC

## 2022-12-25 MED ORDER — IPRATROPIUM-ALBUTEROL 0.5-2.5 (3) MG/3ML IN SOLN: 3.0000 mL | RESPIRATORY_TRACT | Status: DC | PRN

## 2022-12-25 MED ORDER — ACYCLOVIR 400 MG PO TABS
400.0000 mg | ORAL_TABLET | Freq: Two times a day (BID) | ORAL | Status: DC
  Administered 2022-12-25 – 2022-12-30 (×10): 400 mg via ORAL
  Filled 2022-12-25 (×10): qty 1

## 2022-12-25 MED ORDER — IOHEXOL 300 MG/ML  SOLN
100.0000 mL | Freq: Once | INTRAMUSCULAR | Status: AC | PRN
  Administered 2022-12-25: 64 mL via INTRAVENOUS

## 2022-12-25 MED ORDER — GLUCOSE 4 G PO CHEW: 1.0000 | CHEWABLE_TABLET | ORAL | Status: DC | PRN

## 2022-12-25 MED ORDER — PANTOPRAZOLE SODIUM 40 MG PO TBEC
40.0000 mg | DELAYED_RELEASE_TABLET | Freq: Every day | ORAL | Status: DC
  Administered 2022-12-26 – 2022-12-30 (×4): 40 mg via ORAL
  Filled 2022-12-25 (×5): qty 1

## 2022-12-25 MED ORDER — ALBUTEROL SULFATE (2.5 MG/3ML) 0.083% IN NEBU: 2.5000 mg | INHALATION_SOLUTION | Freq: Four times a day (QID) | RESPIRATORY_TRACT | Status: DC | PRN

## 2022-12-25 MED ORDER — HYDRALAZINE HCL 20 MG/ML IJ SOLN: 10.0000 mg | Freq: Four times a day (QID) | INTRAMUSCULAR | Status: DC | PRN

## 2022-12-25 MED ORDER — BACLOFEN 20 MG PO TABS
20.0000 mg | ORAL_TABLET | Freq: Three times a day (TID) | ORAL | Status: DC
  Administered 2022-12-25 – 2022-12-30 (×14): 20 mg via ORAL
  Filled 2022-12-25 (×14): qty 1

## 2022-12-25 MED ORDER — ALBUTEROL SULFATE HFA 108 (90 BASE) MCG/ACT IN AERS: 2.0000 | INHALATION_SPRAY | Freq: Four times a day (QID) | RESPIRATORY_TRACT | Status: DC | PRN

## 2022-12-25 MED ORDER — TRIAMCINOLONE ACETONIDE 0.147 MG/GM EX AERS: 2.0000 | INHALATION_SPRAY | CUTANEOUS | Status: DC

## 2022-12-25 MED ORDER — SENNOSIDES-DOCUSATE SODIUM 8.6-50 MG PO TABS
2.0000 | ORAL_TABLET | Freq: Every day | ORAL | Status: DC
  Administered 2022-12-26 – 2022-12-30 (×5): 2 via ORAL
  Filled 2022-12-25 (×5): qty 2

## 2022-12-25 MED ORDER — LEVOTHYROXINE SODIUM 50 MCG PO TABS
50.0000 ug | ORAL_TABLET | Freq: Every day | ORAL | Status: DC
  Administered 2022-12-26 – 2022-12-30 (×5): 50 ug via ORAL
  Filled 2022-12-25 (×5): qty 1

## 2022-12-25 MED ORDER — LORATADINE 10 MG PO TABS
10.0000 mg | ORAL_TABLET | Freq: Every day | ORAL | Status: DC
  Administered 2022-12-26 – 2022-12-30 (×5): 10 mg via ORAL
  Filled 2022-12-25 (×5): qty 1

## 2022-12-25 MED ORDER — METHADONE HCL 10 MG PO TABS
10.0000 mg | ORAL_TABLET | Freq: Two times a day (BID) | ORAL | Status: DC
  Administered 2022-12-25 – 2022-12-30 (×10): 10 mg via ORAL
  Filled 2022-12-25 (×10): qty 1

## 2022-12-25 MED ORDER — ONDANSETRON HCL 4 MG PO TABS: 4.0000 mg | ORAL_TABLET | Freq: Four times a day (QID) | ORAL | Status: DC | PRN

## 2022-12-25 NOTE — Progress Notes (Signed)
Plan of Care Note for accepted transfer   Patient: Crystal [name] MRN: [id number]   [place]: 12/25/2022  Facility requesting transfer: [facility] Requesting Provider: Rogene Houston Reason for transfer: PE  Facility course: Patient with h/o class 2 obesity, reflex sympathetic dystrophy, chronic fatigue syndrome/fibromyalgia/chronic myofascial pain, DM, hypothyroidism, seizure d/o, and OSA not on CPAP who was seen in the ER last night for RLE pain and + occlusive DVT on [place].  Vascular surgery was called and recommended DOAC and outpatient vascular surgery f/u.  She awoke overnight with dizziness, CP, SOB and was found to have a B PE with RHC.   PCCM reviewed images, marginal.  Recommends Eliquis, obs, echo, and dc tomorrow.    Plan of care: The patient is accepted for admission to Telemetry unit, at [facility] or [facility].   Author: Karmen Bongo, MD 12/25/2022  Check [email or web address] for on-call coverage.  Nursing staff, Please call [place] number on Amion as soon as patient's arrival, so appropriate admitting provider can evaluate the pt.

## 2022-12-25 NOTE — ED Notes (Signed)
Lab made aware of add-on lab test.

## 2022-12-25 NOTE — Progress Notes (Signed)
Critical care attending:  Discussed case with ED provider.  Chart reviewed including CT chest.  Would classify patient as intermediate low risk pulmonary embolism suitable for admission to observation and initiation of DOAC in anticipation of expedited discharge.  Indeed, right heart strain identified on CT appears fairly minimal.  Would obtain BNP and echocardiogram to corroborate this finding.  Suspect RV dysfunction mild at best.  Patient would be suitable for discharge home in the next 24 to 48 hours.  Kipp Brood, MD [facility] ICU Physician [place]  Pager: [phone] Or Epic Secure Chat After hours: [phone].  12/25/2022, 2:48 PM

## 2022-12-25 NOTE — ED Notes (Signed)
Pt is in great spirits at the moment. Patiently Waiting to be seen.

## 2022-12-25 NOTE — Progress Notes (Signed)
ANTICOAGULATION CONSULT NOTE - Initial Consult  Pharmacy Consult for heparin Indication: pulmonary embolus  Allergies  Allergen Reactions   Amoxicillin     Severe yeast infection causing lesions and rawness   Celery (Apium Graveolens Var. Dulce) Skin Test     Numbness and tingling, bitter taste and prickling on the tongue   Flagyl [Metronidazole] Itching and Swelling   Morphine Nausea And Vomiting   Nsaids Nausea Only    Headaches   Other     Metals: Nickel, cat dander, dog dander, hickery, ash, walnut, lambs quarter, dust, ragweed, dustmites, difar, sawdust, tar   Finasteride Itching, Swelling and Rash    redness   Sulfonamide Derivatives Itching and Rash    Blisters in throat     Patient Measurements: Height: '5\' 8"'$  (172.7 cm) Weight: 116.1 kg (255 lb 15.3 oz) IBW/kg (Calculated) : 63.9 Heparin Dosing Weight: 90.7kg  Vital Signs: Temp: 98.2 F (36.8 C) (01/13 1148) Temp Source: Oral (01/13 1148) BP: 130/68 (01/13 1345) Pulse Rate: 64 (01/13 1345)  Labs: Recent Labs    12/25/22 1238  HGB 13.2  HCT 42.5  PLT 307  LABPROT 17.0*  INR 1.4*  CREATININE 0.89  TROPONINIHS 2    Estimated Creatinine Clearance: 87.7 mL/min (by C-G formula based on SCr of 0.89 mg/dL).   Medical History: Past Medical History:  Diagnosis Date   Asthma    Activity induced; improved , reports 08-06-2019 breathing now stable per patient report    Chronic fatigue syndrome    Chronic myofascial pain 1997   Dr Jason Fila   Complication of anesthesia    pseudocolonesterase deficiency   DDD (degenerative disc disease), lumbar [phone]   Dr Jenny Reichmann Rendall   Diabetes mellitus    type 2 , checks daily before breakfast     Endometriosis    Endometriosis    Family history of adverse reaction to anesthesia    Fibromyalgia 1997   Dr Jason Fila   Hypothyroidism    IBS (irritable bowel syndrome)    Interstitial and deep keratitis    Interstitial cystitis    Dr Alona Bene   Lumbago     Morbid obesity ([place])    Osteoarthritis of lumbar spine 2018   Dr Wandra Feinstein   Peripheral neuropathy    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[place])    hadnt had sx of this in over a year    Reflex sympathetic dystrophy    Reflex sympathetic dystrophy of right lower extremity 2002   Right toe- Dr Jason Fila;  "hasnt bothered me in a few years"    Seizures ([place])    last seizure was about 8 months ago, unsure what triggered it , say she was hospitalized at [facility] but encounter not seen in epic , does not take any seizure medication at this time , denies reoccurrence of seizure episodes since that time    Sleep apnea    no device at this time   Thyroid disease    Wears eyeglasses    Wheelchair dependent     Assessment: 45 YOF presenting with CP, recent DVT dx 1/12, Ct angio chest shows PE with evidence of RHS.  New Eliquis start yesterday, will give adjusted bolus considering RHS  Goal of Therapy:  Heparin level 0.3-0.7 units/ml aPTT 66-102 seconds Monitor platelets by anticoagulation protocol: Yes   Plan:  Heparin 4000 units IV x 1, and gtt at 1600 units/hr F/u 6 hour aPTT/HL  Bertis Ruddy, PharmD, [place] Pharmacist ED Pharmacist Phone #  [phone] 12/25/2022 2:34 PM

## 2022-12-25 NOTE — ED Provider Notes (Signed)
[facility]ERGENC[facility] DEPT Provider Note   CSN: [id number] Arrival date & time: 12/25/22  1133     History  Chief Complaint  Patient presents with   Chest Pain    Crystal [name] is a 62 y.o. female.  Seen in the ED last night and diagnosed with DVT right leg and started on Eliquis.  She took it this morning.  Today she developed some chest pain and shortness of breath with some dizziness.  She denies fevers or chills, no nausea or vomiting.  No coughing or hemoptysis.   Chest Pain      Home Medications Prior to Admission medications   Medication Sig Start Date End Date Taking? Authorizing Provider  acetaminophen (TYLENOL) 650 MG CR tablet Take 1,300 mg by mouth every 8 (eight) hours as needed for pain.    [provider]  acyclovir (ZOVIRAX) 400 MG tablet Take 400 mg by mouth 2 (two) times daily.    [provider]  albuterol (PROVENTIL HFA;VENTOLIN HFA) 108 (90 BASE) MCG/ACT inhaler Inhale 2 puffs into the lungs every 6 (six) hours as needed for shortness of breath.     [provider]  baclofen (LIORESAL) 20 MG tablet Take 20 mg by mouth 3 (three) times daily.     [provider]  Cholecalciferol (VITAMIN D3) 50 MCG (2000 UT) capsule Take 2,000 Units by mouth daily.     [provider]  [facility] Liver Oil CAPS Take 1 capsule by mouth in the morning and at bedtime.    [provider]  docusate sodium (COLACE) 100 MG capsule Take 100 mg by mouth 2 (two) times daily.    [provider]  EPINEPHrine (EPI-PEN) 0.3 mg/0.3 mL DEVI Inject 0.3 mg into the muscle once as needed (anaphylaxis).     [provider]  estrogens, conjugated, (PREMARIN) 0.45 MG tablet Take 0.45 mg by mouth daily. Take daily for 21 days then do not take for 7 days.    [provider]  fexofenadine (ALLEGRA) 180 MG tablet Take 180 mg by mouth daily.    [provider]  glucose 4 GM chewable tablet Chew 1 tablet by  mouth as needed for low blood sugar.    [provider]  ipratropium-albuterol (DUONEB) 0.5-2.5 (3) MG/3ML SOLN Take 3 mLs by nebulization every 4 (four) hours as needed (asthma).     [provider]  levothyroxine (SYNTHROID, LEVOTHROID) 50 MCG tablet Take 50 mcg by mouth daily before breakfast.     [provider]  methadone (DOLOPHINE) 10 MG tablet Take 10 mg by mouth in the morning and at bedtime.    [provider]  methocarbamol (ROBAXIN) 500 MG tablet Take 1 tablet (500 mg total) by mouth every 6 (six) hours as needed for muscle spasms. 10/29/22   Edmisten, Ok Anis, PA  Misc Natural Products (COLON-AID PO) Take 1 tablet by mouth in the morning and at bedtime.    [provider]  omeprazole (PRILOSEC) 40 MG capsule Take 40 mg by mouth daily as needed (acid reflux).    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 10/29/22   Edmisten, Kristie L, PA  potassium chloride (K-DUR,KLOR-CON) 10 MEQ tablet Take 20 mEq by mouth 2 (two) times daily.     [provider]  Probiotic Product (PROBIOTIC PO) Take 1  capsule by mouth 3 (three) times daily. Bio X 4    [provider]  Sennosides (SENOKOT PO) Take 2 tablets by mouth daily with lunch.    [provider]  sennosides-docusate sodium (SENOKOT-S) 8.6-50 MG tablet Take 2 tablets by mouth in the morning and at bedtime.    [provider]  triamcinolone (KENALOG) 0.147 MG/GM topical spray Apply 2-3 sprays topically 3 (three) times a week. 09/06/22   [provider]      Allergies    Amoxicillin, Celery (apium graveolens var. dulce) skin test, Flagyl [metronidazole], Morphine, Nsaids, Other, Finasteride, and Sulfonamide derivatives    Review of Systems   Review of Systems  Cardiovascular:  Positive  for chest pain.    Physical Exam Updated Vital Signs BP (!) 156/79   Pulse 78   Temp 98.2 F (36.8 C) (Oral)   Resp 13   Ht '5\' 8"'$  (1.727 m)   Wt 116.1 kg   SpO2 99%   BMI 38.92 kg/m  Physical Exam Vitals and nursing note reviewed.  Constitutional:      General: She is not in acute distress.    Appearance: She is well-developed.  HENT:     Head: Normocephalic and atraumatic.  Eyes:     Conjunctiva/sclera: Conjunctivae normal.  Cardiovascular:     Rate and Rhythm: Normal rate and regular rhythm.     Heart sounds: No murmur heard. Pulmonary:     Effort: Pulmonary effort is normal. No respiratory distress.     Breath sounds: Normal breath sounds.  Abdominal:     Palpations: Abdomen is soft.     Tenderness: There is no abdominal tenderness.  Musculoskeletal:        General: No swelling.     Cervical back: Neck supple.  Skin:    General: Skin is warm and dry.     Capillary Refill: Capillary refill takes less than 2 seconds.  Neurological:     Mental Status: She is alert.  Psychiatric:        Mood and Affect: Mood normal.     ED Results / Procedures / Treatments   Labs (all labs ordered are listed, but only abnormal results are displayed) Labs Reviewed  BASIC METABOLIC PANEL - Abnormal; Notable for the following components:      Result Value   CO2 33 (*)    All other components within normal limits  CBC - Abnormal; Notable for the following components:   RBC 5.31 (*)    MCH 24.9 (*)    All other components within normal limits  PROTIME-INR - Abnormal; Notable for the following components:   Prothrombin Time 17.0 (*)    INR 1.4 (*)    All other components within normal limits  BRAIN NATRIURETIC PEPTIDE  TROPONIN I (HIGH SENSITIVITY)  TROPONIN I (HIGH SENSITIVITY)    EKG None  Radiology CT Angio Chest PE W and/or Wo Contrast  Addendum Date: 12/25/2022   ADDENDUM REPORT: 12/25/2022 14:31 ADDENDUM: Critical Value/emergent results were discussed by  telephone on 12/25/2022 at 2:28 pm with provider Joselito Fieldhouse , who verbally acknowledged these results. Electronically Signed   By: Davina Poke D.O.   On: 12/25/2022 14:31   Result Date: 12/25/2022 CLINICAL DATA:  Pulmonary embolism (PE) suspected, high prob EXAM: CT ANGIOGRAPHY CHEST WITH CONTRAST TECHNIQUE: Multidetector CT imaging of the chest was performed using the standard protocol during bolus administration of intravenous contrast. Multiplanar CT image reconstructions and MIPs were obtained to evaluate the vascular  anatomy. RADIATION DOSE REDUCTION: This exam was performed according to the departmental dose-optimization program which includes automated exposure control, adjustment of the mA and/or kV according to patient size and/or use of iterative reconstruction technique. CONTRAST:  17m OMNIPAQUE IOHEXOL 300 MG/ML  SOLN COMPARISON:  01/02/2019 FINDINGS: Cardiovascular: Satisfactory opacification of the pulmonary arteries. Pulmonary arterial filling defects bilaterally including involvement of the lobar and segmental branch pulmonary arteries of the right lower lobe and right middle lobe as well as segmental branch pulmonary arteries of the left lower lobe. No filling defects within the upper lobes. No saddle embolism. RV to LV ratio of 1.09 with flattening of the interventricular septum. Heart size is normal. No pericardial effusion. Thoracic aorta is nonaneurysmal. Mediastinum/Nodes: No enlarged mediastinal, hilar, or axillary lymph nodes. Thyroid gland, trachea, and esophagus demonstrate no significant findings. Lungs/Pleura: No evidence of pulmonary infarction. No airspace consolidation. No pleural effusion or pneumothorax. Upper Abdomen: Adenomatous thickening of the left adrenal gland is unchanged and does not require follow-up imaging. No acute findings within the included upper abdomen. Musculoskeletal: Exaggerated thoracic kyphosis with findings of diffuse idiopathic skeletal  hyperostosis. No acute bony findings. No chest wall abnormality. Review of the MIP images confirms the above findings. IMPRESSION: Positive for acute bilateral pulmonary emboli with CT evidence of right heart strain (RV/LV Ratio = 1.09 ) consistent with at least submassive (intermediate risk) PE. The presence of right heart strain has been associated with an increased risk of morbidity and mortality. Please refer to the "Code PE Focused" order set in EPIC. Ordering provider has been paged. Documentation of the communication of the above findings will be added to the report in the form of an addendum. Electronically Signed: By: NDavina PokeD.O. On: 12/25/2022 14:19   [place] Img Lower Bilateral (DVT)  Result Date: 12/24/2022 CLINICAL DATA:  Bilateral lower extremity edema. Right hip surgery in November. EXAM: BILATERAL LOWER EXTREMITY VENOUS DOPPLER ULTRASOUND TECHNIQUE: Gray-scale sonography with graded compression, as well as color Doppler and duplex ultrasound were performed to evaluate the lower extremity deep venous systems from the level of the common femoral vein and including the common femoral, femoral, profunda femoral, popliteal and calf veins including the posterior tibial, peroneal and gastrocnemius veins when visible. The superficial great saphenous vein was also interrogated. Spectral Doppler was utilized to evaluate flow at rest and with distal augmentation maneuvers in the common femoral, femoral and popliteal veins. COMPARISON:  None Available. FINDINGS: RIGHT LOWER EXTREMITY Common Femoral Vein: Nonocclusive mural thrombus present which is echogenic and has the appearance of chronic mural thrombus. Saphenofemoral Junction: No evidence of thrombus. Normal compressibility and flow on color Doppler imaging. Profunda Femoral Vein: No evidence of thrombus. Normal compressibility and flow on color Doppler imaging. Femoral Vein: Thrombus present in the right femoral vein which appears to be  largely occlusive and likely more acute in appearance compared to the common femoral vein thrombus. Popliteal Vein: The popliteal vein is very poorly visualized and not adequately assessed. Calf Veins: Probable thrombus in the right posterior tibial vein. The peroneal vein is not visualized. Superficial Great Saphenous Vein: No evidence of thrombus. Normal compressibility. Venous Reflux:  None. Other Findings: No evidence of superficial thrombophlebitis or abnormal fluid collection. LEFT LOWER EXTREMITY Common Femoral Vein: No evidence of thrombus. Normal compressibility, respiratory phasicity and response to augmentation. Saphenofemoral Junction: No evidence of thrombus. Normal compressibility and flow on color Doppler imaging. Profunda Femoral Vein: No evidence of thrombus. Normal compressibility and flow on color Doppler imaging.  Femoral Vein: No evidence of thrombus. Normal compressibility, respiratory phasicity and response to augmentation. Popliteal Vein: No evidence of thrombus. Normal compressibility, respiratory phasicity and response to augmentation. Calf Veins: No evidence of thrombus. Normal compressibility and flow on color Doppler imaging. Superficial Great Saphenous Vein: The visualized great saphenous vein demonstrates thrombophlebitis up to nearly the level of the saphenofemoral junction. Venous Reflux:  None. Other Findings:  No abnormal fluid collections. IMPRESSION: 1. Acute occlusive DVT in the right femoral vein and probable acute occlusive thrombus in the right posterior tibial vein. 2. 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 3. Superficial thrombophlebitis of the left great saphenous vein. 4. Poor visualization of the right popliteal vein and nonvisualization of the right peroneal vein. Electronically Signed   By: Aletta Edouard M.D.   On: 12/24/2022 17:00    Procedures Procedures    Medications Ordered in ED Medications  iohexol (OMNIPAQUE)  300 MG/ML solution 100 mL (64 mLs Intravenous Contrast Given 12/25/22 1403)    ED Course/ Medical Decision Making/ A&P                             Medical Decision Making This patient presents to the ED for concern of chest pain, this involves an extensive number of treatment options, and is a complaint that carries with it a high risk of complications and morbidity.  The differential diagnosis includes pulmonary embolus, PE, pneumonia, other   Co morbidities that complicate the patient evaluation  Recent history of hip replacement, diabetes   Additional history obtained:  Additional history obtained from EMR External records from outside source obtained and reviewed including outpatient DVT study showing occlusive VTE and right femoral vein and right posterior tibial vein   Lab Tests:  I Ordered, and personally interpreted labs.  The pertinent results include: Reassuring troponin, reassuring CBC and BMP   Imaging Studies ordered:  I ordered imaging studies including CT angio chest I independently visualized and interpreted imaging which showed bilateral pulmonary emboli I agree with the radiologist interpretation   Cardiac Monitoring: / EKG:  The patient was maintained on a cardiac monitor.  I personally viewed and interpreted the cardiac monitored which showed an underlying rhythm of: Sinus rhythm   Consultations Obtained:  I requested consultation with the intensivist Dr. Lynetta Mare,  and discussed lab and imaging findings as well as pertinent plan - they recommend: No heparin, they would like patient to be treated with a DOAC admitted to hospitalist service for observation and echocardiogram.  He reviewed the CT results personally and states that the right heart strain is a very marginal   Problem List / ED Course / Critical interventions / Medication management  Pulmonary embolism-as above patient will be treated with DOAC rather than heparin drip.  Discussed with  patient and family and they are understanding this.  Patient took her Eliquis this morning she is hemodynamically stable.  She declined offer of pain medication stating she is not having any pain at this time  I have reviewed the patients home medicines and have made adjustments as needed         Amount and/or Complexity of Data Reviewed Labs: ordered. Radiology: ordered.  Risk Prescription drug management. Decision regarding hospitalization.   Discussed case with Dr. Lorin Mercy the hospitalist who is agreeable with admission        Final Clinical Impression(s) / ED Diagnoses Final diagnoses:  Acute pulmonary embolism, unspecified  pulmonary embolism type, unspecified whether acute cor pulmonale present [facility])    Rx / DC Orders ED Discharge Orders     None         Darci Current 12/25/22 1518    Fredia Sorrow, MD 12/26/22 [phone]

## 2022-12-25 NOTE — Discharge Instructions (Signed)
Information on my medicine - ELIQUIS (apixaban)  Why was Eliquis prescribed for you? Eliquis was prescribed to treat blood clots that may have been found in the veins of your legs (deep vein thrombosis) or in your lungs (pulmonary embolism) and to reduce the risk of them occurring again.  What do You need to know about Eliquis ? The starting dose is 10 mg (two 5 mg tablets) taken TWICE daily for the FIRST SEVEN (7) DAYS, then on January 19th in the evening, the dose is reduced to ONE 5 mg tablet taken TWICE daily.  Eliquis may be taken with or without food.   Try to take the dose about the same time in the morning and in the evening. If you have difficulty swallowing the tablet whole please discuss with your pharmacist how to take the medication safely.  Take Eliquis exactly as prescribed and DO NOT stop taking Eliquis without talking to the doctor who prescribed the medication.  Stopping may increase your risk of developing a new blood clot.  Refill your prescription before you run out.  After discharge, you should have regular check-up appointments with your healthcare provider that is prescribing your Eliquis.    What do you do if you miss a dose? If a dose of ELIQUIS is not taken at the scheduled time, take it as soon as possible on the same day and twice-daily administration should be resumed. The dose should not be doubled to make up for a missed dose.  Important Safety Information A possible side effect of Eliquis is bleeding. You should call your healthcare provider right away if you experience any of the following: Bleeding from an injury or your nose that does not stop. Unusual colored urine (red or dark brown) or unusual colored stools (red or black). Unusual bruising for unknown reasons. A serious fall or if you hit your head (even if there is no bleeding).  Some medicines may interact with Eliquis and might increase your risk of bleeding or clotting while on Eliquis.  To help avoid this, consult your healthcare provider or pharmacist prior to using any new prescription or non-prescription medications, including herbals, vitamins, non-steroidal anti-inflammatory drugs (NSAIDs) and supplements.  This website has more information on Eliquis (apixaban): http://www.eliquis.com/eliquis/home

## 2022-12-25 NOTE — ED Triage Notes (Signed)
Pt via pov from home; she was seen here last night and diagnosed with DVT; she has vascular consult next week. She reports awakening with dizziness, cp, pain upon deep inspiration. Pt alert & oriented, nad noted.

## 2022-12-25 NOTE — H&P (Signed)
History and Physical    Patient: Crystal [name] SHF:[id number] DOB: Apr 20, 1961 DOA: 12/25/2022 DOS: the patient was seen and examined on 12/25/2022 PCP: Rogers Blocker, MD  Patient coming from: Home  Chief Complaint:  Chief Complaint  Patient presents with   Chest Pain   HPI: Crystal [name] is a 62 y.o. female with medical history significant of for but not limited to asthma, chronic myofascial pain, history of degenerative disc disease, diabetes mellitus type 2, endometriosis, fibromyalgia, hypothyroidism, osteoarthritis of the lumbar spine, Prinzmetal angina, reflux sympathetic dystrophy of the right lower extremity, history of seizure disorder no longer on antiepileptics, sleep apnea not on any devices at this time as well as a history of a recent right hip replacement back in November 2023 who presented to the ED yesterday with complaints of leg swelling after she was found to have a DVT.  She was placed on apixaban and discharged home with outpatient follow-up for vascular given stability and the ED discussion with the vascular surgeon.  She went home and that night she became extremely short of breath, anxious and diaphoretic and had some chest discomfort and pain so she came back for representation is found to have a bilateral PE.  Patient's chest pain and discomfort have improved and she is no longer dizzy or short of breath.  CT scan done showed bilateral PE with right heart strain and PCCM was consulted and reviewed the images and they felt that they would classify the patient as intermediate low risk pulmonary embolism suitable for admission for observation with initiation of DOAC and felt that the CT scan right heart strain was fairly minimal.  Pulmonary recommending obtaining BNP as well as echocardiogram and monitoring overnight.  She denies any other concerns or complaints at this time and burning and discomfort in her urine, abdominal discomfort,.  Patient continues to have some leg  discomfort and states that she has had multiple complications from her surgery including different infections and rashes.  [facility] and [facility] was asked to admit this patient for observation for her new bilateral PE.  Review of Systems: As mentioned in the history of present illness. All other systems reviewed and are negative. Past Medical History:  Diagnosis Date   Asthma    Activity induced; improved , reports 08-06-2019 breathing now stable per patient report    Chronic fatigue syndrome    Chronic myofascial pain 1997   Dr Jason Fila   Complication of anesthesia    pseudocolonesterase deficiency   DDD (degenerative disc disease), lumbar [phone]   Dr Jenny Reichmann Rendall   Diabetes mellitus    type 2 , checks daily before breakfast     Endometriosis    Fibromyalgia 1997   Dr Jason Fila   Hypothyroidism    IBS (irritable bowel syndrome)    Interstitial cystitis    Dr Alona Bene   Morbid obesity [facility])    Osteoarthritis of lumbar spine 2018   Dr Wandra Feinstein   Peripheral neuropathy    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[place])    hadnt had sx of this in over a year    Reflex sympathetic dystrophy of right lower extremity 2002   Right toe- Dr Jason Fila;  "hasnt bothered me in a few years"    Seizures ([place])    last seizure was about 8 months ago, unsure what triggered it , say she was hospitalized at [facility] but encounter not seen in epic ,  does not take any seizure medication at this time , denies reoccurrence of seizure episodes since that time    Sleep apnea    no device at this time   Wears eyeglasses    Wheelchair dependent    Past Surgical History:  Procedure Laterality Date   [facility]- Dr Kendall Flack   ABDOMINAL SURGERY     BLADDER SURGERY  1997   Bladder distention, [facility] (1998, 2003, 2004, 2006, 2007, 2008, 2009, 2012, 2013)( [facility]-1999, 2001, 2002) [facility] (2015, 2017,)   BUNIONECTOMY  Right 2001   [facility]- Dr Melrose Nakayama   CARDIAC CATHETERIZATION  "some year ago i cant remember"    reports she was having chest pain at the time but says cath was clean     CHOLECYSTECTOMY  1987   [facility], Dr Werner Lean   COLONOSCOPY WITH PROPOFOL N/A 08/07/2019   Procedure: COLONOSCOPY WITH PROPOFOL;  Surgeon: Wilford Corner, MD;  Location: [facility];  Service: Endoscopy;  Laterality: N/A;   GYNECOLOGIC CRYOSURGERY  1983   Dr Oleh Genin office   JOINT REPLACEMENT     denies    KNEE ARTHROSCOPY Left 1991   [facility]- Dr Oretha Caprice   KNEE SURGERY     denies    POLYPECTOMY  08/07/2019   Procedure: POLYPECTOMY;  Surgeon: Wilford Corner, MD;  Location: [facility];  Service: Endoscopy;;   PUBOVAGINAL [place] Right 10/27/2022   Procedure: TOTAL HIP ARTHROPLASTY ANTERIOR APPROACH;  Surgeon: [name], MD;  Location: [facility];  Service: Orthopedics;  Laterality: Right;   TUBAL LIGATION  1990   [facility]- Dr Kendall Flack   Social History:  reports that she has never smoked. She has never used smokeless tobacco. She reports that she does not drink alcohol and does not use drugs.  Allergies  Allergen Reactions   Amoxicillin     Severe yeast infection causing lesions and rawness   Celery (Apium Graveolens Var. Dulce) Skin Test     Numbness and tingling, bitter taste and prickling on the tongue   Flagyl [Metronidazole] Itching and Swelling   Morphine Nausea And Vomiting   Nsaids Nausea Only    Headaches   Other     Metals: Nickel, cat dander, dog dander, hickery, ash, walnut, lambs quarter, dust, ragweed, dustmites, difar, sawdust, tar   Finasteride Itching, Swelling and Rash    redness   Sulfonamide Derivatives Itching and Rash    Blisters in throat    FAMILY HISTORY Patient states that her family members including brothers mother and father have had blood clots  Prior to Admission medications   Medication  Sig Start Date End Date Taking? Authorizing Provider  acetaminophen (TYLENOL) 650 MG CR tablet Take 1,300 mg by mouth every 8 (eight) hours as needed for pain.    [provider]  acyclovir (ZOVIRAX) 400 MG tablet Take 400 mg by mouth 2 (two) times daily.    [provider]  albuterol (PROVENTIL HFA;VENTOLIN HFA) 108 (90 BASE) MCG/ACT inhaler Inhale 2 puffs into the lungs every 6 (six) hours as needed for shortness of breath.     [provider]  baclofen (LIORESAL) 20 MG tablet Take 20 mg by mouth 3 (three) times daily.     [provider]  Cholecalciferol (VITAMIN D3) 50 MCG (2000 UT) capsule Take 2,000 Units by mouth daily.     [provider]  Cod Liver Oil CAPS Take 1 capsule by mouth in the morning and at bedtime.    [provider]  docusate sodium (COLACE) 100 MG capsule Take 100 mg by mouth 2 (two) times daily.    [provider]  EPINEPHrine (EPI-PEN) 0.3 mg/0.3 mL DEVI Inject 0.3 mg into the muscle once as needed (anaphylaxis).     [provider]  estrogens, conjugated, (PREMARIN) 0.45 MG tablet Take 0.45 mg by mouth daily. Take daily for 21 days then do not take for 7 days.    [provider]  fexofenadine (ALLEGRA) 180 MG tablet Take 180 mg by mouth daily.    [provider]  glucose 4 GM chewable tablet Chew 1 tablet by mouth as needed for low blood sugar.    [provider]  ipratropium-albuterol (DUONEB) 0.5-2.5 (3) MG/3ML SOLN Take 3 mLs by nebulization every 4 (four) hours as needed (asthma).     [provider]  levothyroxine (SYNTHROID, LEVOTHROID) 50 MCG tablet Take 50 mcg by mouth daily before breakfast.     [provider]  methadone (DOLOPHINE) 10 MG tablet Take 10 mg by mouth in the morning and at bedtime.    [provider]  methocarbamol (ROBAXIN) 500 MG tablet Take 1 tablet (500 mg total) by mouth every 6 (six) hours as needed for muscle spasms.  10/29/22   Edmisten, Ok Anis, PA  Misc Natural Products (COLON-AID PO) Take 1 tablet by mouth in the morning and at bedtime.    [provider]  omeprazole (PRILOSEC) 40 MG capsule Take 40 mg by mouth daily as needed (acid reflux).    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 10/29/22   Edmisten, Kristie L, PA  potassium chloride (K-DUR,KLOR-CON) 10 MEQ tablet Take 20 mEq by mouth 2 (two) times daily.     [provider]  Probiotic Product (PROBIOTIC PO) Take 1 capsule by mouth 3 (three) times daily. Bio X 4    [provider]  Sennosides (SENOKOT PO) Take 2 tablets by mouth daily with lunch.    [provider]  sennosides-docusate sodium (SENOKOT-S) 8.6-50 MG tablet Take 2 tablets by mouth in the morning and at bedtime.    [provider]  triamcinolone (KENALOG) 0.147 MG/GM topical spray Apply 2-3 sprays topically 3 (three) times a week. 09/06/22   [provider]   Physical Exam: Vitals:   12/25/22 1500 12/25/22 1530 12/25/22 1559 12/25/22 1600  BP: (!) 152/79 (!) 155/75  (!) 152/74  Pulse: 76 73  79  Resp: '12 13  12  '$ Temp:   98 F (36.7 C)   TempSrc:   Oral   SpO2: 100% 100%  100%  Weight:      Height:       Examination: Physical Exam:  Constitutional: WN/WD obese AAF in NAD Respiratory: Diminished to auscultation bilaterally, no wheezing, rales, rhonchi or crackles. Normal respiratory effort and patient is not tachypenic. No accessory muscle use. Unlabored breathing and not wearing Supplemental O2 via [place] Cardiovascular: RRR, no murmurs / rubs / gallops. S1 and S2 auscultated. No extremity edema. Abdomen: Soft, non-tender, Distended 2/2 to body habitus. Bowel sounds positive.  GU: Deferred. Musculoskeletal: No clubbing / cyanosis of digits/nails. No joint  deformity upper and lower extremities but Right Leg much more swollen compared to the Left  Skin: No rashes, lesions, ulcers on a limited skin evaluation. No induration; Warm and dry.  Neurologic: CN 2-12 grossly intact with no focal deficits. Romberg sign and cerebellar reflexes not assessed.  Psychiatric: Normal judgment and insight. Alert and oriented x 3. Normal mood and appropriate affect.   Data Reviewed:  Recent Results (from the past 2160 hour(s))  Glucose, capillary     Status: None   Collection Time: 10/21/22  1:08 PM  Result Value Ref Range   Glucose-Capillary 84 70 - 99 mg/dL    Comment: Glucose reference range applies only to samples taken after fasting for at least 8 hours.  Surgical pcr screen     Status: None   Collection Time: 10/21/22  1:34 PM   Specimen: Nasal Mucosa; Nasal Swab  Result Value Ref Range   MRSA, PCR NEGATIVE NEGATIVE   Staphylococcus aureus NEGATIVE NEGATIVE    Comment: (NOTE) The Xpert SA Assay (FDA approved for NASAL specimens in patients 32 years of age and older), is one component of a comprehensive surveillance program. It is not intended to diagnose infection nor to guide or monitor treatment. Performed at [facility], [place] [address], [place], [place] [address]   Hemoglobin A1c per protocol     Status: None   Collection Time: 10/21/22  1:34 PM  Result Value Ref Range   Hgb A1c MFr Bld 5.4 4.8 - 5.6 %    Comment: (NOTE) Pre diabetes:          5.7%-6.4%  Diabetes:              >6.4%  Glycemic control for   <7.0% adults with diabetes    Mean Plasma Glucose 108.28 mg/dL    Comment: Performed at [facility] [address], [place], [place] [address]  Basic metabolic panel per protocol     Status: Abnormal   Collection Time: 10/21/22  1:34 PM  Result Value Ref Range   Sodium 141 135 - 145 mmol/L   Potassium 4.8 3.5 - 5.1 mmol/L   Chloride 107 98 - 111 mmol/L   CO2 30 22 - 32 mmol/L   Glucose, Bld 81 70 - 99  mg/dL    Comment: Glucose reference range applies only to samples taken after fasting for at least 8 hours.   BUN 8 8 - 23 mg/dL   Creatinine, Ser 0.92 0.44 - 1.00 mg/dL   Calcium 9.0 8.9 - 10.3 mg/dL   GFR, Estimated >60 >60 mL/min    Comment: (NOTE) Calculated using the CKD-EPI Creatinine Equation (2021)    Anion gap 4 (L) 5 - 15    Comment: Performed at [facility], [place] [address], [place], [place] [address]  CBC per protocol     Status: Abnormal   Collection Time: 10/21/22  1:34 PM  Result Value Ref Range   WBC 7.5 4.0 - 10.5 K/uL   RBC 5.49 (H) 3.87 - 5.11 MIL/uL   Hemoglobin 14.5 12.0 - 15.0 g/dL   HCT 47.0 (H) 36.0 - 46.0 %   MCV 85.6 80.0 - 100.0 fL   MCH 26.4 26.0 - 34.0 pg   MCHC 30.9 30.0 - 36.0 g/dL   RDW 14.9 11.5 - 15.5 %   Platelets 307 150 - 400 K/uL   nRBC 0.0 0.0 - 0.2 %    Comment: Performed at [facility], [place] [address], [place], [place] [address]  Type and screen [facility]  Status: None   Collection Time: 10/21/22  1:34 PM  Result Value Ref Range   ABO/RH(D) B POS    Antibody Screen NEG    Sample Expiration 10/30/2022,2359    Extend sample reason      NO TRANSFUSIONS OR PREGNANCY IN THE PAST 3 MONTHS Performed at [facility], [place] [address], [place], [place] [address]   Glucose, capillary     Status: Abnormal   Collection Time: 10/27/22  6:58 AM  Result Value Ref Range   Glucose-Capillary 102 (H) 70 - 99 mg/dL    Comment: Glucose reference range applies only to samples taken after fasting for at least 8 hours.  ABO/Rh     Status: None   Collection Time: 10/27/22  7:25 AM  Result Value Ref Range   ABO/RH(D)      B POS Performed at [facility], [place] [address], [place], [place] [address]   Glucose, capillary     Status: Abnormal   Collection Time: 10/27/22 10:47 AM  Result Value Ref Range   Glucose-Capillary 144 (H) 70 - 99 mg/dL    Comment:  Glucose reference range applies only to samples taken after fasting for at least 8 hours.  CBC     Status: Abnormal   Collection Time: 10/28/22  3:04 AM  Result Value Ref Range   WBC 16.5 (H) 4.0 - 10.5 K/uL   RBC 4.69 3.87 - 5.11 MIL/uL   Hemoglobin 12.5 12.0 - 15.0 g/dL   HCT 40.3 36.0 - 46.0 %   MCV 85.9 80.0 - 100.0 fL   MCH 26.7 26.0 - 34.0 pg   MCHC 31.0 30.0 - 36.0 g/dL   RDW 14.9 11.5 - 15.5 %   Platelets 248 150 - 400 K/uL   nRBC 0.0 0.0 - 0.2 %    Comment: Performed at [facility], [place] [address] [place], [place] [address]  Basic metabolic panel     Status: Abnormal   Collection Time: 10/28/22  3:04 AM  Result Value Ref Range   Sodium 142 135 - 145 mmol/L   Potassium 3.9 3.5 - 5.1 mmol/L   Chloride 104 98 - 111 mmol/L   CO2 29 22 - 32 mmol/L   Glucose, Bld 134 (H) 70 - 99 mg/dL    Comment: Glucose reference range applies only to samples taken after fasting for at least 8 hours.   BUN 6 (L) 8 - 23 mg/dL   Creatinine, Ser 0.76 0.44 - 1.00 mg/dL   Calcium 8.6 (L) 8.9 - 10.3 mg/dL   GFR, Estimated >60 >60 mL/min    Comment: (NOTE) Calculated using the CKD-EPI Creatinine Equation (2021)    Anion gap 9 5 - 15    Comment: Performed at [facility], [place] [address] [place],  [address]  CBC     Status: Abnormal   Collection Time: 10/29/22  3:14 AM  Result Value Ref Range   WBC 15.1 (H) 4.0 - 10.5 K/uL   RBC 4.37 3.87 - 5.11 MIL/uL   Hemoglobin 11.4 (L) 12.0 - 15.0 g/dL   HCT 37.7 36.0 - 46.0 %   MCV 86.3 80.0 - 100.0 fL   MCH 26.1 26.0 - 34.0 pg   MCHC 30.2 30.0 - 36.0 g/dL   RDW 15.0 11.5 - 15.5 %   Platelets 212 150 - 400 K/uL   nRBC 0.0 0.0 - 0.2 %    Comment: Performed at [facility], [address] [place], [place] [address]  Glucose, capillary     Status: Abnormal   Collection Time: 10/30/22  7:57 AM  Result Value Ref Range   Glucose-Capillary 106 (H) 70 - 99 mg/dL    Comment: Glucose  reference range applies only to samples taken after fasting for at least 8 hours.  Basic metabolic panel     Status: Abnormal   Collection Time: 12/25/22 12:38 PM  Result Value Ref Range   Sodium 141 135 - 145 mmol/L   Potassium 3.8 3.5 - 5.1 mmol/L   Chloride 102 98 - 111 mmol/L   CO2 33 (H) 22 - 32 mmol/L   Glucose, Bld 95 70 - 99 mg/dL    Comment: Glucose reference range applies only to samples taken after fasting for at least 8 hours.   BUN 10 8 - 23 mg/dL   Creatinine, Ser 0.89 0.44 - 1.00 mg/dL   Calcium 9.6 8.9 - 10.3 mg/dL   GFR, Estimated >60 >60 mL/min    Comment: (NOTE) Calculated using the CKD-EPI Creatinine Equation (2021)    Anion gap 6 5 - 15    Comment: Performed at [facility], [address] [place], [place] [address]  CBC     Status: Abnormal   Collection Time: 12/25/22 12:38 PM  Result Value Ref Range   WBC 7.4 4.0 - 10.5 K/uL   RBC 5.31 (H) 3.87 - 5.11 MIL/uL   Hemoglobin 13.2 12.0 - 15.0 g/dL   HCT 42.5 36.0 - 46.0 %   MCV 80.0 80.0 - 100.0 fL   MCH 24.9 (L) 26.0 - 34.0 pg   MCHC 31.1 30.0 - 36.0 g/dL   RDW 14.0 11.5 - 15.5 %   Platelets 307 150 - 400 K/uL   nRBC 0.0 0.0 - 0.2 %    Comment: Performed at [facility], [address], [place], [place] [address]  Troponin I (High Sensitivity)     Status: None   Collection Time: 12/25/22 12:38 PM  Result Value Ref Range   Troponin I (High Sensitivity) 2 <18 ng/L    Comment: (NOTE) Elevated high sensitivity troponin I (hsTnI) values and significant  changes across serial measurements may suggest ACS but many other  chronic and acute conditions are known to elevate hsTnI results.  Refer to the "Links" section for chest pain algorithms and additional  guidance. Performed at [facility], [address] [place], [place] [address]   Protime-INR (order if Patient is taking Coumadin / Warfarin)     Status: Abnormal   Collection Time: 12/25/22  12:38 PM  Result Value Ref Range   Prothrombin Time 17.0 (H) 11.4 - 15.2 seconds   INR 1.4 (H) 0.8 - 1.2    Comment: (NOTE) INR goal varies based on device and disease states. Performed at [facility], [address] [place], [place] [address]   Brain natriuretic peptide     Status: None   Collection Time: 12/25/22 12:38 PM  Result Value Ref Range   B Natriuretic Peptide 30.0 0.0 - 100.0 pg/mL    Comment: Performed at [facility], [place], [place] [address]  Troponin I (High Sensitivity)     Status: None   Collection Time: 12/25/22  2:09 PM  Result Value Ref Range   Troponin I (High Sensitivity) 2 <18 ng/L    Comment: (NOTE) Elevated high sensitivity troponin I (hsTnI) values and significant  changes across serial measurements may suggest ACS but  many other  chronic and acute conditions are known to elevate hsTnI results.  Refer to the "Links" section for chest pain algorithms and additional  guidance. Performed at [facility], [address], [place], [place] [address]   Magnesium     Status: None   Collection Time: 12/25/22  6:32 PM  Result Value Ref Range   Magnesium 2.1 1.7 - 2.4 mg/dL    Comment: Performed at [facility], [place] [address], [place], [place] [address]  Phosphorus     Status: None   Collection Time: 12/25/22  6:32 PM  Result Value Ref Range   Phosphorus 3.7 2.5 - 4.6 mg/dL    Comment: Performed at [facility], [place] [address] [place], [place] [address]   Assessment and Plan: No notes have been filed under this hospital service. Service: Hospitalist  Acute Bilateral PE with associated with Acute occlusive DVT in the right femoral vein and probable acute occlusive thrombus in the right posterior tibial vein and Chronic appearing nonocclusive mural thrombus in the right common femoral vein. -Placed in Observation Telemetry -CTA of the Chest  PE protocol done and showed "Positive for acute bilateral pulmonary emboli with CT evidence of right heart strain (RV/LV Ratio = 1.09 ) consistent with at least submassive (intermediate risk) PE. The presence of right heart strain has been associated with an increased risk of morbidity and mortality. Please refer to the "Code PE Focused" order set in EPIC." -LE Venous Duplex done and showed "Acute occlusive DVT in the right femoral vein and probable acute occlusive thrombus in the right posterior tibial vein. 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 Superficial thrombophlebitis of the left great saphenous vein. Poor visualization of the right popliteal vein and nonvisualization of the right peroneal vein." -Check echocardiogram as well as BNP but BNP was not elevated -Resume apixaban -Takes Premarin but will hold and stop -Obtain an ambulatory home O2 screen prior to discharge -Troponin x 2 are negative -Chest discomfort has improved -Will give gentle IV fluid hydration with normal saline at 75 MLS per hour for 16 hours  Hypothyroidism -Check TSH -C/w Levothyroxine 50 mcg po Daily  Chronic Pain -Resume Home Baclofen and Meds including Methadone, Acetaminophen, and Breakthrough Oxycodne  GERD/GI Prophylaxis -C/w Pantoprazole 40 mg po Daily   Obesity -Complicates overall prognosis and care -Estimated body mass index is 38.92 kg/m as calculated from the following:   Height as of this encounter: '5\' 8"'$  (1.727 m).   Weight as of this encounter: 116.1 kg.  -Weight Loss and Dietary Counseling given  Advance Care Planning:   Code Status: Prior FULL CODE  Consults: EDP discussed with Pulmonary   Family Communication: Discussed with Husband at bedside  Severity of Illness: The appropriate patient status for this patient is OBSERVATION. Observation status is judged to be reasonable and necessary in order to provide the required intensity of service to  ensure the patient's safety. The patient's presenting symptoms, physical exam findings, and initial radiographic and laboratory data in the context of their medical condition is felt to place them at decreased risk for further clinical deterioration. Furthermore, it is anticipated that the patient will be medically stable for discharge from the hospital within 2 midnights of admission.   Author: Raiford Noble, DO [facility]  12/25/2022 5:17 PM  For on call review [email or web address].

## 2022-12-25 NOTE — Progress Notes (Signed)
ANTICOAGULATION CONSULT NOTE - Initial Consult  Pharmacy Consult for Eliquis Indication: pulmonary embolus  Allergies  Allergen Reactions   Amoxicillin     Severe yeast infection causing lesions and rawness   Celery (Apium Graveolens Var. Dulce) Skin Test     Numbness and tingling, bitter taste and prickling on the tongue   Flagyl [Metronidazole] Itching and Swelling   Morphine Nausea And Vomiting   Nsaids Nausea Only    Headaches   Other     Metals: Nickel, cat dander, dog dander, hickery, ash, walnut, lambs quarter, dust, ragweed, dustmites, difar, sawdust, tar   Finasteride Itching, Swelling and Rash    redness   Sulfonamide Derivatives Itching and Rash    Blisters in throat     Patient Measurements: Height: '5\' 8"'$  (172.7 cm) Weight: 116.1 kg (255 lb 15.3 oz) IBW/kg (Calculated) : 63.9 Heparin Dosing Weight: 90.7kg  Vital Signs: Temp: 98.2 F (36.8 C) (01/13 1730) Temp Source: Oral (01/13 1730) BP: 151/86 (01/13 1730) Pulse Rate: 75 (01/13 1730)  Labs: Recent Labs    12/25/22 1238 12/25/22 1409  HGB 13.2  --   HCT 42.5  --   PLT 307  --   LABPROT 17.0*  --   INR 1.4*  --   CREATININE 0.89  --   TROPONINIHS 2 2     Estimated Creatinine Clearance: 87.7 mL/min (by C-G formula based on SCr of 0.89 mg/dL).   Medical History: Past Medical History:  Diagnosis Date   Asthma    Activity induced; improved , reports 08-06-2019 breathing now stable per patient report    Chronic fatigue syndrome    Chronic myofascial pain 1997   Dr Jason Fila   Complication of anesthesia    pseudocolonesterase deficiency   DDD (degenerative disc disease), lumbar [phone]   Dr Jenny Reichmann Rendall   Diabetes mellitus    type 2 , checks daily before breakfast     Endometriosis    Fibromyalgia 1997   Dr Jason Fila   Hypothyroidism    IBS (irritable bowel syndrome)    Interstitial cystitis    Dr Alona Bene   Morbid obesity [facility])    Osteoarthritis of lumbar spine 2018   Dr  Wandra Feinstein   Peripheral neuropathy    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[place])    hadnt had sx of this in over a year    Reflex sympathetic dystrophy of right lower extremity 2002   Right toe- Dr Jason Fila;  "hasnt bothered me in a few years"    Seizures ([place])    last seizure was about 8 months ago, unsure what triggered it , say she was hospitalized at [facility] but encounter not seen in epic , does not take any seizure medication at this time , denies reoccurrence of seizure episodes since that time    Sleep apnea    no device at this time   Wears eyeglasses    Wheelchair dependent     Assessment: 26 YOF presenting with CP, recent DVT dx 1/12, Ct angio chest shows PE with evidence of RHS.  New Eliquis start yesterday 1/12 PM, Pharmacy consulted to continue dosing.  Today, 12/25/2022 Confirmed with patient that she has taken 2 doses of '10mg'$  thus far CBC: WNL SCr WNL No bleeding reported  Goal of Therapy:  Therapeutic anticoagulation   Plan:  Continue with Eliquis '10mg'$  BID x 6 more days, then '5mg'$  BID thereafter No dose adjustments needed  Peggyann Juba, PharmD, [facility]: [phone] 12/25/2022 6:31 PM

## 2022-12-26 ENCOUNTER — Observation Stay (HOSPITAL_COMMUNITY): Payer: Medicare Other

## 2022-12-26 ENCOUNTER — Encounter (HOSPITAL_COMMUNITY): Payer: Self-pay | Admitting: Internal Medicine

## 2022-12-26 DIAGNOSIS — I2602 Saddle embolus of pulmonary artery with acute cor pulmonale: Secondary | ICD-10-CM | POA: Diagnosis not present

## 2022-12-26 DIAGNOSIS — S71001A Unspecified open wound, right hip, initial encounter: Secondary | ICD-10-CM | POA: Diagnosis not present

## 2022-12-26 DIAGNOSIS — I82401 Acute embolism and thrombosis of unspecified deep veins of right lower extremity: Secondary | ICD-10-CM | POA: Diagnosis not present

## 2022-12-26 DIAGNOSIS — I2609 Other pulmonary embolism with acute cor pulmonale: Secondary | ICD-10-CM | POA: Diagnosis not present

## 2022-12-26 LAB — ECHOCARDIOGRAM COMPLETE
Area-P 1/2: 3.77 cm2
Calc EF: 60.3 %
Height: 68 in
S' Lateral: 2.2 cm
Single Plane A2C EF: 54.2 %
Single Plane A4C EF: 66.9 %
Weight: 4095.26 oz

## 2022-12-26 LAB — COMPREHENSIVE METABOLIC PANEL
ALT: 10 U/L (ref 0–44)
AST: 16 U/L (ref 15–41)
Albumin: 3.5 g/dL (ref 3.5–5.0)
Alkaline Phosphatase: 95 U/L (ref 38–126)
Anion gap: 8 (ref 5–15)
BUN: 7 mg/dL — ABNORMAL LOW (ref 8–23)
CO2: 30 mmol/L (ref 22–32)
Calcium: 9.1 mg/dL (ref 8.9–10.3)
Chloride: 100 mmol/L (ref 98–111)
Creatinine, Ser: 0.84 mg/dL (ref 0.44–1.00)
GFR, Estimated: >60 mL/min (ref >60)
Glucose, Bld: 98 mg/dL (ref 70–99)
Potassium: 3.8 mmol/L (ref 3.5–5.1)
Sodium: 138 mmol/L (ref 135–145)
Total Bilirubin: 0.4 mg/dL (ref 0.3–1.2)
Total Protein: 7.5 g/dL (ref 6.5–8.1)

## 2022-12-26 LAB — CBC WITH DIFFERENTIAL/PLATELET
Abs Immature Granulocytes: 0.04 10*3/uL (ref 0.00–0.07)
Basophils Absolute: 0.1 10*3/uL (ref 0.0–0.1)
Basophils Relative: 1 %
Eosinophils Absolute: 0.2 10*3/uL (ref 0.0–0.5)
Eosinophils Relative: 2 %
HCT: 42.2 % (ref 36.0–46.0)
Hemoglobin: 12.4 g/dL (ref 12.0–15.0)
Immature Granulocytes: 1 %
Lymphocytes Relative: 35 %
Lymphs Abs: 2.8 10*3/uL (ref 0.7–4.0)
MCH: 24.1 pg — ABNORMAL LOW (ref 26.0–34.0)
MCHC: 29.4 g/dL — ABNORMAL LOW (ref 30.0–36.0)
MCV: 82.1 fL (ref 80.0–100.0)
Monocytes Absolute: 0.8 10*3/uL (ref 0.1–1.0)
Monocytes Relative: 10 %
Neutro Abs: 4.1 10*3/uL (ref 1.7–7.7)
Neutrophils Relative %: 51 %
Platelets: 272 10*3/uL (ref 150–400)
RBC: 5.14 MIL/uL — ABNORMAL HIGH (ref 3.87–5.11)
RDW: 14 % (ref 11.5–15.5)
WBC: 7.9 10*3/uL (ref 4.0–10.5)
nRBC: 0 % (ref 0.0–0.2)

## 2022-12-26 LAB — GLUCOSE, CAPILLARY: Glucose-Capillary: 91 mg/dL (ref 70–99)

## 2022-12-26 LAB — TSH: TSH: 2.019 u[IU]/mL (ref 0.350–4.500)

## 2022-12-26 LAB — PHOSPHORUS: Phosphorus: 4 mg/dL (ref 2.5–4.6)

## 2022-12-26 LAB — MAGNESIUM: Magnesium: 1.8 mg/dL (ref 1.7–2.4)

## 2022-12-26 MED ORDER — PERFLUTREN LIPID MICROSPHERE
1.0000 mL | INTRAVENOUS | Status: AC | PRN
  Administered 2022-12-26: 2 mL via INTRAVENOUS

## 2022-12-26 MED ORDER — IOHEXOL 300 MG/ML  SOLN
100.0000 mL | Freq: Once | INTRAMUSCULAR | Status: AC | PRN
  Administered 2022-12-26: 100 mL via INTRAVENOUS

## 2022-12-26 NOTE — Consult Note (Signed)
[facility] Nurse Consult Note: Reason for Consult:Right hip wound. Wound type: infectious, surgical  Pressure Injury POA: N/A  Discussed with Bedside RN and with Hospitalist, Dr. Alfredia Ferguson via [place]. Dr. Maureen Ralphs (Orthopedics) has been following this wound and provided guidance that the dressing should be reinforced, no changed as silver had been used in it. Bedside RN C. Millar has reinforced the dressing and Dr. Alfredia Ferguson reports he will consult with Dr. Maureen Ralphs tomorrow.  No role for WOC Nursing at this time.  [facility] nursing team will not follow, but will remain available to this patient, the nursing and medical teams.  Please re-consult if needed.  Thank you for inviting [place] to participate in this patient's Plan of Care.  Maudie Flakes, MSN, RN, CNS, [place], Serita Grammes, [employer], [employer] phone:  [phone]

## 2022-12-26 NOTE — Progress Notes (Signed)
  Echocardiogram 2D Echocardiogram has been performed.  Crystal [name] 12/26/2022, 10:18 AM

## 2022-12-26 NOTE — Progress Notes (Addendum)
Dr. Alfredia Ferguson removed old dressing and ordered a CT scan of the right hip. He also consulted with wound nurse, Lorie. Blood cultures were drawn. Lorie wants to leave original dressing in place as ordered. So Mererdith and myself cleansed the Rt hip area with normal saline and applied Silver Hydrofiber dressing topped with 4x4 gauze and ABD. Dr. Alfredia Ferguson will consult with Dr. Maureen Ralphs in the AM.

## 2022-12-26 NOTE — Progress Notes (Signed)
PROGRESS NOTE    [name]  SLH:[id number] DOB: 11-29-1961 DOA: 12/25/2022 PCP: Rogers Blocker, MD   Brief Narrative:  Crystal [name] is a 62 y.o. female with medical history significant of for but not limited to asthma, chronic myofascial pain, history of degenerative disc disease, diabetes mellitus type 2, endometriosis, fibromyalgia, hypothyroidism, osteoarthritis of the lumbar spine, Prinzmetal angina, reflux sympathetic dystrophy of the right lower extremity, history of seizure disorder no longer on antiepileptics, sleep apnea not on any devices at this time as well as a history of a recent right hip replacement back in November 2023 who presented to the ED yesterday with complaints of leg swelling after she was found to have a DVT.  She was placed on apixaban and discharged home with outpatient follow-up for vascular given stability and the ED discussion with the vascular surgeon.  She went home and that night she became extremely short of breath, anxious and diaphoretic and had some chest discomfort and pain so she came back for representation is found to have a bilateral PE.  Patient's chest pain and discomfort have improved and she is no longer dizzy or short of breath.  CT scan done showed bilateral PE with right heart strain and PCCM was consulted and reviewed the images and they felt that they would classify the patient as intermediate low risk pulmonary embolism suitable for admission for observation with initiation of DOAC and felt that the CT scan right heart strain was fairly minimal.  Pulmonary recommending obtaining BNP as well as echocardiogram and monitoring overnight.  She denies any other concerns or complaints at this time and burning and discomfort in her urine, abdominal discomfort,.  Patient continues to have some leg discomfort and states that she has had multiple complications from her surgery including different infections and rashes.  [facility] was [facility] for [facility] evaluation and [facility] was asked to admit this patient for observation for her new bilateral PE.   **She is improving and continues to have some chest discomfort.  Complain of significant pain in her right hip and has a notable wound that is purulent drainage and foul-smelling odor as well as some mild dehiscence.  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 -Placed in Observation Telemetry -CTA of the Chest PE protocol done and showed "Positive for acute bilateral pulmonary emboli with CT evidence of right heart strain (RV/LV Ratio = 1.09 ) consistent with at least submassive (intermediate risk) PE. The presence of right heart strain has been associated with an increased risk of morbidity and mortality. Please refer to the "Code PE Focused" order set in EPIC." -LE Venous Duplex done and showed "Acute occlusive DVT in the right femoral vein and probable acute occlusive thrombus in the right posterior tibial vein. 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 Superficial thrombophlebitis of the left great saphenous vein. Poor visualization of the right popliteal vein and nonvisualization of the right peroneal vein." -Check echocardiogram as well as BNP but BNP was not elevated -ECHO showed no Evidence of  Right Heart Systolic Dysfunction -Resume apixaban but may need to transition to Heparin gtt given Below  -Takes Premarin but will hold and stop -Obtain an ambulatory home O2 screen prior to discharge -Troponin x 2 are  negative -Chest discomfort has improved -Will give gentle IV fluid hydration with normal saline at 75 MLS per hour for 16 hours  Right Hip Wound and Drainage -Noted to have a Right Hip wound with some drainage, foul smelling odor, and Slight Wound Dehiscence -Obtain Blood Cx x2 -Check Right Hip CT w/wo Contrast to evaluate for Deeper Infection -Discussed with ID and Hold off Abx at this time as patient is not Septic -WOC nurse consulted -Will need Ortho to Evaluate and Treat and her Primary Orthopedic Surgeon will be notified in the AM    Hypothyroidism -Checked TSH and was 2.019 -C/w Levothyroxine 50 mcg po Daily   Chronic Pain -Resume Home Baclofen and Meds including Methadone, Acetaminophen, and Breakthrough Oxycodne   GERD/GI Prophylaxis -C/w Pantoprazole 40 mg po Daily    Obesity -Complicates overall prognosis and care -Estimated body mass index is 38.92 kg/m as calculated from the following:   Height as of this encounter: '5\' 8"'$  (1.727 m).   Weight as of this encounter: 116.1 kg.  -Weight Loss and Dietary Counseling given   DVT prophylaxis: SCDs Start: 12/25/22 1803 apixaban (ELIQUIS) tablet 10 mg  apixaban (ELIQUIS) tablet 5 mg    Code Status: Full Code Family Communication: Discussed with husband at bedside  Disposition Plan:  Level of care: Telemetry Status is: Observation The patient will require care spanning > 2 midnights and should be moved to inpatient because: Needs further orthopedic evaluation and following up on her   Consultants:  Discussed with infectious diseases Will consult orthopedic surgery in the a.m.  Procedures:  Echocardiogram IMPRESSIONS     1. Right heart not well visualized but appears to be normal in size with  preserved function.   2. Left ventricular ejection fraction, by estimation, is 70 to 75%. The  left ventricle has hyperdynamic function. The left ventricle has no  regional wall motion abnormalities. Left ventricular  diastolic parameters  were normal.   3. Right ventricular systolic function is normal. The right ventricular  size is normal.   4. The mitral valve is normal in structure. No evidence of mitral valve  regurgitation. No evidence of mitral stenosis.   5. The aortic valve is tricuspid. Aortic valve regurgitation is not  visualized. No aortic stenosis is present.   6. The inferior vena cava is dilated in size with >50% respiratory  variability, suggesting right atrial pressure of 8 mmHg.   FINDINGS   Left Ventricle: Left ventricular ejection fraction, by estimation, is 70  to 75%. The left ventricle has hyperdynamic function. The left ventricle  has no regional wall motion abnormalities. Definity contrast agent was  given IV to delineate the left  ventricular endocardial borders. The left ventricular internal cavity size  was normal in size. There is no left ventricular hypertrophy. Left  ventricular diastolic parameters were normal.   Right Ventricle: The right ventricular size is normal. Right ventricular  systolic function is normal.   Left Atrium: Left atrial size was normal in size.   Right Atrium: Right atrial size was normal in size.   Pericardium: There is no evidence of pericardial effusion.   Mitral Valve: The mitral valve is normal in structure. No evidence of  mitral valve regurgitation. No evidence of mitral valve stenosis.   Tricuspid  Valve: The tricuspid valve is normal in structure. Tricuspid  valve regurgitation is trivial. No evidence of tricuspid stenosis.   Aortic Valve: The aortic valve is tricuspid. Aortic valve regurgitation is  not visualized. No aortic stenosis is present.   Pulmonic Valve: The pulmonic valve was normal in structure. Pulmonic valve  regurgitation is not visualized. No evidence of pulmonic stenosis.   Aorta: The aortic root is normal in size and structure.   Venous: The inferior vena cava is dilated in size with greater than 50%   respiratory variability, suggesting right atrial pressure of 8 mmHg.   IAS/Shunts: No atrial level shunt detected by color flow Doppler.   Additional Comments: Right heart not well visualized but appears to be  normal in size with preserved function.     LEFT VENTRICLE  PLAX 2D  LVIDd:         4.10 cm     Diastology  LVIDs:         2.20 cm     LV e' medial:    7.51 cm/s  LV PW:         1.20 cm     LV E/e' medial:  8.0  LV IVS:        0.90 cm     LV e' lateral:   11.35 cm/s  LVOT diam:     2.30 cm     LV E/e' lateral: 5.3  LV SV:         82  LV SV Index:   36  LVOT Area:     4.15 cm    LV Volumes (MOD)  LV vol d, MOD A2C: 63.6 ml  LV vol d, MOD A4C: 72.2 ml  LV vol s, MOD A2C: 29.1 ml  LV vol s, MOD A4C: 23.9 ml  LV SV MOD A2C:     34.5 ml  LV SV MOD A4C:     72.2 ml  LV SV MOD BP:      41.9 ml   RIGHT VENTRICLE             IVC  RV S prime:     14.80 cm/s  IVC diam: 2.30 cm  TAPSE (M-mode): 1.8 cm   LEFT ATRIUM             Index        RIGHT ATRIUM           Index  LA diam:        2.90 cm 1.28 cm/m   RA Area:     12.30 cm  LA Vol (A2C):   42.2 ml 18.59 ml/m  RA Volume:   25.80 ml  11.37 ml/m  LA Vol (A4C):   20.9 ml 9.21 ml/m  LA Biplane Vol: 29.7 ml 13.09 ml/m   AORTIC VALVE  LVOT Vmax:   104.00 cm/s  LVOT Vmean:  63.800 cm/s  LVOT VTI:    0.197 m    AORTA  Ao Root diam: 3.00 cm  Ao Asc diam:  3.30 cm   MITRAL VALVE  MV Area (PHT): 3.77 cm    SHUNTS  MV Decel Time: 201 msec    Systemic VTI:  0.20 m  MV E velocity: 60.10 cm/s  Systemic Diam: 2.30 cm  MV A velocity: 61.10 cm/s  MV E/A ratio:  0.98  MV A Prime:    7.7 cm/s   Antimicrobials:  Anti-infectives (From admission, onward)    Start     Dose/Rate  Route Frequency Ordered Stop   12/25/22 2200  acyclovir (ZOVIRAX) tablet 400 mg        400 mg Oral 2 times daily 12/25/22 1813         Subjective: Seen and examined at bedside and was still complaining of some chest discomfort and felt about the  same.  Has not ambulated yet.  Also complaining of significant pain in her right hip with foul-smelling odor and drainage noted.  No lightheadedness or dizziness.  Legs remain still swollen.  No other concerns or plaints this time.  Objective: Vitals:   12/25/22 1730 12/25/22 2225 12/26/22 0429 12/26/22 1303  BP: (!) 151/86 133/76 (!) 144/77 139/76  Pulse: 75 73 73 84  Resp: '16 18 18 16  '$ Temp: 98.2 F (36.8 C) 98.3 F (36.8 C) 97.6 F (36.4 C) 98.6 F (37 C)  TempSrc: Oral Oral Oral Oral  SpO2: 100% 97% 100% 99%  Weight:      Height:       No intake or output data in the 24 hours ending 12/26/22 1425 Filed Weights   12/25/22 1145  Weight: 116.1 kg   Examination: Physical Exam:  Constitutional: WN/WD obese African-American female sitting on the chair at bedside Respiratory: Diminished to auscultation bilaterally with coarse breath sounds, no wheezing, rales, rhonchi or crackles. Normal respiratory effort and patient is not tachypenic. No accessory muscle use.  Unlabored breathing but not wearing supplemental oxygen via nasal cannula Cardiovascular: RRR, no murmurs / rubs / gallops. S1 and S2 auscultated.  Has 1+ lower extremity edema worse on the right compared to left Abdomen: Soft, non-tender, distended due to body habitus bowel sounds positive.  GU: Deferred. Musculoskeletal: No clubbing / cyanosis of digits/nails.    Skin: Has a wound on the right hip with some slight dehiscence and surrounding erythema with purulent drainage Neurologic: CN 2-12 grossly intact with no focal deficits. Romberg sign and cerebellar reflexes not assessed.  Psychiatric: Normal judgment and insight. Alert and oriented x 3. Normal mood and appropriate affect.   Data Reviewed: I have personally reviewed following labs and imaging studies  CBC: Recent Labs  Lab 12/25/22 1238 12/26/22 0721  WBC 7.4 7.9  NEUTROABS  --  4.1  HGB 13.2 12.4  HCT 42.5 42.2  MCV 80.0 82.1  PLT 307 144   Basic  Metabolic Panel: Recent Labs  Lab 12/25/22 1238 12/25/22 1832 12/26/22 0721  NA 141  --  138  K 3.8  --  3.8  CL 102  --  100  CO2 33*  --  30  GLUCOSE 95  --  98  BUN 10  --  7*  CREATININE 0.89  --  0.84  CALCIUM 9.6  --  9.1  MG  --  2.1 1.8  PHOS  --  3.7 4.0   GFR: Estimated Creatinine Clearance: 93 mL/min (by C-G formula based on SCr of 0.84 mg/dL). Liver Function Tests: Recent Labs  Lab 12/26/22 0721  AST 16  ALT 10  ALKPHOS 95  BILITOT 0.4  PROT 7.5  ALBUMIN 3.5   No results for input(s): "LIPASE", "AMYLASE" in the last 168 hours. No results for input(s): "AMMONIA" in the last 168 hours. Coagulation Profile: Recent Labs  Lab 12/25/22 1238  INR 1.4*   Cardiac Enzymes: No results for input(s): "CKTOTAL", "CKMB", "CKMBINDEX", "TROPONINI" in the last 168 hours. BNP (last 3 results) No results for input(s): "PROBNP" in the last 8760 hours. HbA1C: No results for input(s): "HGBA1C" in  the last 72 hours. CBG: Recent Labs  Lab 12/26/22 0713  GLUCAP 91   Lipid Profile: No results for input(s): "CHOL", "HDL", "LDLCALC", "TRIG", "CHOLHDL", "LDLDIRECT" in the last 72 hours. Thyroid Function Tests: Recent Labs    12/26/22 0721  TSH 2.019   Anemia Panel: No results for input(s): "VITAMINB12", "FOLATE", "FERRITIN", "TIBC", "IRON", "RETICCTPCT" in the last 72 hours. Sepsis Labs: No results for input(s): "PROCALCITON", "LATICACIDVEN" in the last 168 hours.  No results found for this or any previous visit (from the past 240 hour(s)).   Radiology Studies: ECHOCARDIOGRAM COMPLETE  Result Date: 12/26/2022    ECHOCARDIOGRAM REPORT   Patient Name:   Crystal [name] Date of Exam: 12/26/2022 Medical Rec #:  [id number]      Height:       68.0 in Accession #:    [id number]     Weight:       256.0 lb Date of Birth:  May 08, 1961     BSA:          2.270 m Patient Age:    27 years       BP:           144/77 mmHg Patient Gender: F              HR:           90 bpm. Exam  Location:  Inpatient Procedure: 2D Echo, Cardiac Doppler, Color Doppler and Intracardiac            Opacification Agent Indications:    I26.02 Pulmonary embolus  History:        Patient has prior history of Echocardiogram examinations, most                 recent 09/30/2008. Abnormal ECG, Arrythmias:Tachycardia,                 Signs/Symptoms:Dyspnea, Shortness of Breath and Chest Pain; Risk                 Factors:Diabetes and Hypertension.  Sonographer:    Roseanna Rainbow RDCS Referring Phys: [id number] [facility] LATIF [facility]  Sonographer Comments: Technically difficult study due to poor echo windows, suboptimal apical window, suboptimal subcostal window and patient is obese. Image acquisition challenging due to patient body habitus. Extremely difficult apicals due to habitus and high fowler's position. IMPRESSIONS  1. Right heart not well visualized but appears to be normal in size with preserved function.  2. Left ventricular ejection fraction, by estimation, is 70 to 75%. The left ventricle has hyperdynamic function. The left ventricle has no regional wall motion abnormalities. Left ventricular diastolic parameters were normal.  3. Right ventricular systolic function is normal. The right ventricular size is normal.  4. The mitral valve is normal in structure. No evidence of mitral valve regurgitation. No evidence of mitral stenosis.  5. The aortic valve is tricuspid. Aortic valve regurgitation is not visualized. No aortic stenosis is present.  6. The inferior vena cava is dilated in size with >50% respiratory variability, suggesting right atrial pressure of 8 mmHg. FINDINGS  Left Ventricle: Left ventricular ejection fraction, by estimation, is 70 to 75%. The left ventricle has hyperdynamic function. The left ventricle has no regional wall motion abnormalities. Definity contrast agent was given IV to delineate the left ventricular endocardial borders. The left ventricular internal cavity size was normal in size. There is no  left ventricular hypertrophy. Left ventricular diastolic parameters were normal. Right Ventricle: The right ventricular size is normal.  Right ventricular systolic function is normal. Left Atrium: Left atrial size was normal in size. Right Atrium: Right atrial size was normal in size. Pericardium: There is no evidence of pericardial effusion. Mitral Valve: The mitral valve is normal in structure. No evidence of mitral valve regurgitation. No evidence of mitral valve stenosis. Tricuspid Valve: The tricuspid valve is normal in structure. Tricuspid valve regurgitation is trivial. No evidence of tricuspid stenosis. Aortic Valve: The aortic valve is tricuspid. Aortic valve regurgitation is not visualized. No aortic stenosis is present. Pulmonic Valve: The pulmonic valve was normal in structure. Pulmonic valve regurgitation is not visualized. No evidence of pulmonic stenosis. Aorta: The aortic root is normal in size and structure. Venous: The inferior vena cava is dilated in size with greater than 50% respiratory variability, suggesting right atrial pressure of 8 mmHg. IAS/Shunts: No atrial level shunt detected by color flow Doppler. Additional Comments: Right heart not well visualized but appears to be normal in size with preserved function.  LEFT VENTRICLE PLAX 2D LVIDd:         4.10 cm     Diastology LVIDs:         2.20 cm     LV e' medial:    7.51 cm/s LV PW:         1.20 cm     LV E/e' medial:  8.0 LV IVS:        0.90 cm     LV e' lateral:   11.35 cm/s LVOT diam:     2.30 cm     LV E/e' lateral: 5.3 LV SV:         82 LV SV Index:   36 LVOT Area:     4.15 cm  LV Volumes (MOD) LV vol d, MOD A2C: 63.6 ml LV vol d, MOD A4C: 72.2 ml LV vol s, MOD A2C: 29.1 ml LV vol s, MOD A4C: 23.9 ml LV SV MOD A2C:     34.5 ml LV SV MOD A4C:     72.2 ml LV SV MOD BP:      41.9 ml RIGHT VENTRICLE             IVC RV S prime:     14.80 cm/s  IVC diam: 2.30 cm TAPSE (M-mode): 1.8 cm LEFT ATRIUM             Index        RIGHT ATRIUM            Index LA diam:        2.90 cm 1.28 cm/m   RA Area:     12.30 cm LA Vol (A2C):   42.2 ml 18.59 ml/m  RA Volume:   25.80 ml  11.37 ml/m LA Vol (A4C):   20.9 ml 9.21 ml/m LA Biplane Vol: 29.7 ml 13.09 ml/m  AORTIC VALVE LVOT Vmax:   104.00 cm/s LVOT Vmean:  63.800 cm/s LVOT VTI:    0.197 m  AORTA Ao Root diam: 3.00 cm Ao Asc diam:  3.30 cm MITRAL VALVE MV Area (PHT): 3.77 cm    SHUNTS MV Decel Time: 201 msec    Systemic VTI:  0.20 m MV E velocity: 60.10 cm/s  Systemic Diam: 2.30 cm MV A velocity: 61.10 cm/s MV E/A ratio:  0.98 MV A Prime:    7.7 cm/s Kirk Ruths MD Electronically signed by Kirk Ruths MD Signature Date/Time: 12/26/2022/10:28:28 AM    Final    DG CHEST PORT 1 VIEW  Result  Date: 12/26/2022 CLINICAL DATA:  Shortness of breath, dyspnea. EXAM: PORTABLE CHEST 1 VIEW COMPARISON:  Chest x-ray dated 06/26/2019. FINDINGS: Heart size and mediastinal contours are stable. Lungs are clear. No pleural effusion or pneumothorax is seen. Osseous structures about the chest are unremarkable. IMPRESSION: No active disease. No evidence of pneumonia or pulmonary edema. Electronically Signed   By: Franki Cabot M.D.   On: 12/26/2022 07:47   CT Angio Chest PE W and/or Wo Contrast  Addendum Date: 12/25/2022   ADDENDUM REPORT: 12/25/2022 14:31 ADDENDUM: Critical Value/emergent results were discussed by telephone on 12/25/2022 at 2:28 pm with provider CELESTE BEATTY , who verbally acknowledged these results. Electronically Signed   By: Davina Poke D.O.   On: 12/25/2022 14:31   Result Date: 12/25/2022 CLINICAL DATA:  Pulmonary embolism (PE) suspected, high prob EXAM: CT ANGIOGRAPHY CHEST WITH CONTRAST TECHNIQUE: Multidetector CT imaging of the chest was performed using the standard protocol during bolus administration of intravenous contrast. Multiplanar CT image reconstructions and MIPs were obtained to evaluate the vascular anatomy. RADIATION DOSE REDUCTION: This exam was performed according to the  departmental dose-optimization program which includes automated exposure control, adjustment of the mA and/or kV according to patient size and/or use of iterative reconstruction technique. CONTRAST:  69m OMNIPAQUE IOHEXOL 300 MG/ML  SOLN COMPARISON:  01/02/2019 FINDINGS: Cardiovascular: Satisfactory opacification of the pulmonary arteries. Pulmonary arterial filling defects bilaterally including involvement of the lobar and segmental branch pulmonary arteries of the right lower lobe and right middle lobe as well as segmental branch pulmonary arteries of the left lower lobe. No filling defects within the upper lobes. No saddle embolism. RV to LV ratio of 1.09 with flattening of the interventricular septum. Heart size is normal. No pericardial effusion. Thoracic aorta is nonaneurysmal. Mediastinum/Nodes: No enlarged mediastinal, hilar, or axillary lymph nodes. Thyroid gland, trachea, and esophagus demonstrate no significant findings. Lungs/Pleura: No evidence of pulmonary infarction. No airspace consolidation. No pleural effusion or pneumothorax. Upper Abdomen: Adenomatous thickening of the left adrenal gland is unchanged and does not require follow-up imaging. No acute findings within the included upper abdomen. Musculoskeletal: Exaggerated thoracic kyphosis with findings of diffuse idiopathic skeletal hyperostosis. No acute bony findings. No chest wall abnormality. Review of the MIP images confirms the above findings. IMPRESSION: Positive for acute bilateral pulmonary emboli with CT evidence of right heart strain (RV/LV Ratio = 1.09 ) consistent with at least submassive (intermediate risk) PE. The presence of right heart strain has been associated with an increased risk of morbidity and mortality. Please refer to the "Code PE Focused" order set in EPIC. Ordering provider has been paged. Documentation of the communication of the above findings will be added to the report in the form of an addendum. Electronically  Signed: By: NDavina PokeD.O. On: 12/25/2022 14:19   [place]Venous Img Lower Bilateral (DVT)  Result Date: 12/24/2022 CLINICAL DATA:  Bilateral lower extremity edema. Right hip surgery in November. EXAM: BILATERAL LOWER EXTREMITY VENOUS DOPPLER ULTRASOUND TECHNIQUE: Gray-scale sonography with graded compression, as well as color Doppler and duplex ultrasound were performed to evaluate the lower extremity deep venous systems from the level of the common femoral vein and including the common femoral, femoral, profunda femoral, popliteal and calf veins including the posterior tibial, peroneal and gastrocnemius veins when visible. The superficial great saphenous vein was also interrogated. Spectral Doppler was utilized to evaluate flow at rest and with distal augmentation maneuvers in the common femoral, femoral and popliteal veins. COMPARISON:  None Available. FINDINGS: RIGHT  LOWER EXTREMITY Common Femoral Vein: Nonocclusive mural thrombus present which is echogenic and has the appearance of chronic mural thrombus. Saphenofemoral Junction: No evidence of thrombus. Normal compressibility and flow on color Doppler imaging. Profunda Femoral Vein: No evidence of thrombus. Normal compressibility and flow on color Doppler imaging. Femoral Vein: Thrombus present in the right femoral vein which appears to be largely occlusive and likely more acute in appearance compared to the common femoral vein thrombus. Popliteal Vein: The popliteal vein is very poorly visualized and not adequately assessed. Calf Veins: Probable thrombus in the right posterior tibial vein. The peroneal vein is not visualized. Superficial Great Saphenous Vein: No evidence of thrombus. Normal compressibility. Venous Reflux:  None. Other Findings: No evidence of superficial thrombophlebitis or abnormal fluid collection. LEFT LOWER EXTREMITY Common Femoral Vein: No evidence of thrombus. Normal compressibility, respiratory phasicity and response to  augmentation. Saphenofemoral Junction: No evidence of thrombus. Normal compressibility and flow on color Doppler imaging. Profunda Femoral Vein: No evidence of thrombus. Normal compressibility and flow on color Doppler imaging. Femoral Vein: No evidence of thrombus. Normal compressibility, respiratory phasicity and response to augmentation. Popliteal Vein: No evidence of thrombus. Normal compressibility, respiratory phasicity and response to augmentation. Calf Veins: No evidence of thrombus. Normal compressibility and flow on color Doppler imaging. Superficial Great Saphenous Vein: The visualized great saphenous vein demonstrates thrombophlebitis up to nearly the level of the saphenofemoral junction. Venous Reflux:  None. Other Findings:  No abnormal fluid collections. IMPRESSION: 1. Acute occlusive DVT in the right femoral vein and probable acute occlusive thrombus in the right posterior tibial vein. 2. 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 3. Superficial thrombophlebitis of the left great saphenous vein. 4. Poor visualization of the right popliteal vein and nonvisualization of the right peroneal vein. Electronically Signed   By: Aletta Edouard M.D.   On: 12/24/2022 17:00    Scheduled Meds:  acyclovir  400 mg Oral BID   apixaban  10 mg Oral BID   Followed by   Derrill Memo ON 12/31/2022] apixaban  5 mg Oral BID   baclofen  20 mg Oral TID   cholecalciferol  2,000 Units Oral Daily   docusate sodium  100 mg Oral BID   levothyroxine  50 mcg Oral Q0600   loratadine  10 mg Oral Daily   methadone  10 mg Oral Q12H   pantoprazole  40 mg Oral Daily   senna-docusate  2 tablet Oral Daily   [START ON 12/27/2022] triamcinolone  2-3 spray Topical Once per day on Mon Wed Fri   Continuous Infusions:   LOS: 0 days   Raiford Noble, DO [facility] Available via Epic secure chat 7am-7pm After these hours, please refer to coverage provider listed on [email or web address] 12/26/2022,  2:25 PM

## 2022-12-27 ENCOUNTER — Other Ambulatory Visit (HOSPITAL_COMMUNITY): Payer: Self-pay

## 2022-12-27 DIAGNOSIS — I2609 Other pulmonary embolism with acute cor pulmonale: Secondary | ICD-10-CM | POA: Diagnosis not present

## 2022-12-27 DIAGNOSIS — E669 Obesity, unspecified: Secondary | ICD-10-CM | POA: Diagnosis not present

## 2022-12-27 DIAGNOSIS — I82411 Acute embolism and thrombosis of right femoral vein: Secondary | ICD-10-CM

## 2022-12-27 DIAGNOSIS — I82401 Acute embolism and thrombosis of unspecified deep veins of right lower extremity: Secondary | ICD-10-CM | POA: Diagnosis not present

## 2022-12-27 DIAGNOSIS — N179 Acute kidney failure, unspecified: Secondary | ICD-10-CM | POA: Diagnosis not present

## 2022-12-27 LAB — COMPREHENSIVE METABOLIC PANEL
ALT: 10 U/L (ref 0–44)
AST: 17 U/L (ref 15–41)
Albumin: 3.6 g/dL (ref 3.5–5.0)
Alkaline Phosphatase: 95 U/L (ref 38–126)
Anion gap: 9 (ref 5–15)
BUN: 8 mg/dL (ref 8–23)
CO2: 28 mmol/L (ref 22–32)
Calcium: 8.8 mg/dL — ABNORMAL LOW (ref 8.9–10.3)
Chloride: 103 mmol/L (ref 98–111)
Creatinine, Ser: 1.19 mg/dL — ABNORMAL HIGH (ref 0.44–1.00)
GFR, Estimated: 52 mL/min — ABNORMAL LOW (ref >60)
Glucose, Bld: 91 mg/dL (ref 70–99)
Potassium: 3.5 mmol/L (ref 3.5–5.1)
Sodium: 140 mmol/L (ref 135–145)
Total Bilirubin: 0.3 mg/dL (ref 0.3–1.2)
Total Protein: 7.6 g/dL (ref 6.5–8.1)

## 2022-12-27 LAB — CBC WITH DIFFERENTIAL/PLATELET
Abs Immature Granulocytes: 0.02 10*3/uL (ref 0.00–0.07)
Basophils Absolute: 0.1 10*3/uL (ref 0.0–0.1)
Basophils Relative: 1 %
Eosinophils Absolute: 0.2 10*3/uL (ref 0.0–0.5)
Eosinophils Relative: 2 %
HCT: 43.4 % (ref 36.0–46.0)
Hemoglobin: 12.8 g/dL (ref 12.0–15.0)
Immature Granulocytes: 0 %
Lymphocytes Relative: 34 %
Lymphs Abs: 3 10*3/uL (ref 0.7–4.0)
MCH: 24.5 pg — ABNORMAL LOW (ref 26.0–34.0)
MCHC: 29.5 g/dL — ABNORMAL LOW (ref 30.0–36.0)
MCV: 83.1 fL (ref 80.0–100.0)
Monocytes Absolute: 1 10*3/uL (ref 0.1–1.0)
Monocytes Relative: 11 %
Neutro Abs: 4.6 10*3/uL (ref 1.7–7.7)
Neutrophils Relative %: 52 %
Platelets: 293 10*3/uL (ref 150–400)
RBC: 5.22 MIL/uL — ABNORMAL HIGH (ref 3.87–5.11)
RDW: 14.1 % (ref 11.5–15.5)
WBC: 8.8 10*3/uL (ref 4.0–10.5)
nRBC: 0 % (ref 0.0–0.2)

## 2022-12-27 LAB — PHOSPHORUS: Phosphorus: 2.6 mg/dL (ref 2.5–4.6)

## 2022-12-27 LAB — MAGNESIUM: Magnesium: 2 mg/dL (ref 1.7–2.4)

## 2022-12-27 LAB — GLUCOSE, CAPILLARY: Glucose-Capillary: 140 mg/dL — ABNORMAL HIGH (ref 70–99)

## 2022-12-27 MED ORDER — INFLUENZA VAC SPLIT QUAD 0.5 ML IM SUSY: 0.5000 mL | PREFILLED_SYRINGE | INTRAMUSCULAR | Status: DC

## 2022-12-27 MED ORDER — SODIUM CHLORIDE 0.9 % IV SOLN: INTRAVENOUS | Status: DC

## 2022-12-27 MED ORDER — PNEUMOCOCCAL 20-VAL CONJ VACC 0.5 ML IM SUSY
0.5000 mL | PREFILLED_SYRINGE | INTRAMUSCULAR | Status: AC
  Administered 2022-12-29: 0.5 mL via INTRAMUSCULAR
  Filled 2022-12-27: qty 0.5

## 2022-12-27 NOTE — Progress Notes (Signed)
Overnight   NAME: Crystal [name] MRN: [id number] DOB : 02/05/1961    Date of Service   12/27/2022   HPI/Events of Note    Notified by RN for patient refusal of IVF.  Patient is aware of the implications of this and confirms.    A&O x 4     Interventions/ Plan   Resume fluid as agreeable        Gershon Cull BSN MSNA MSN ACNPC-AG Acute Care Nurse Practitioner [place].

## 2022-12-27 NOTE — Care Management Obs Status (Signed)
[place] NOTIFICATION   Patient Details  Name: Crystal [name] MRN: [id number] Date of Birth: 04-Jan-1961   Medicare Observation Status Notification Given:  Yes    Angelita Ingles, RN 12/27/2022, 10:29 AM

## 2022-12-27 NOTE — Progress Notes (Signed)
Subjective: Crystal [name] is a 62 yo female well known to me from a right THA in 12/23 who has had difficulty healing the superior aspect of her incision. She was admitted for DVT/PE and noted to have drainage from the open area of the incision. She has not had any fever, chills or recent infectious symptoms.We were consulted to examine the wound and treat as needed    Objective: Vital signs in last 24 hours: Temp:  [98.4 F (36.9 C)-98.7 F (37.1 C)] 98.4 F (36.9 C) (01/15 0441) Pulse Rate:  [81-83] 81 (01/15 0441) Resp:  [18] 18 (01/15 0441) BP: (144-149)/(77-83) 149/83 (01/15 0441) SpO2:  [99 %] 99 % (01/15 0441)  Intake/Output from previous day: 01/14 0701 - 01/15 0700 In: 120 [P.O.:120] Out: -  Intake/Output this shift: Total I/O In: 240 [P.O.:240] Out: -   Recent Labs    12/25/22 1238 12/26/22 0721 12/27/22 1017  HGB 13.2 12.4 12.8   Recent Labs    12/26/22 0721 12/27/22 1017  WBC 7.9 8.8  RBC 5.14* 5.22*  HCT 42.2 43.4  PLT 272 293   Recent Labs    12/26/22 0721 12/27/22 1017  NA 138 140  K 3.8 3.5  CL 100 103  CO2 30 28  BUN 7* 8  CREATININE 0.84 1.19*  GLUCOSE 98 91  CALCIUM 9.1 8.8*   Recent Labs    12/25/22 1238  INR 1.4*    Neurologically intact Neurovascular intact No cellulitis present Compartment soft Incision well healed except for superficial open area at top part of incision. Her pannus folds over this area and keeps it moist with odor secondary to the moisture. There is no communication with deep tissue and no drainage is noted. There is no surrounding erythema.     Assessment/Plan: S.p right THA- There are no signs of deep or superficial infection.The key to getting this to heal is keeping it dry surrounding the wound and putting a topical antimicrobial on the wound itself. There is no need for surgical intervention at this time. We will follow  [name], MD 12/27/2022, 2:23 PM

## 2022-12-27 NOTE — Progress Notes (Signed)
PROGRESS NOTE    Crystal [name]  ANV:[id number] DOB: Aug 27, 1961 DOA: 12/25/2022 PCP: Rogers Blocker, MD   Brief Narrative:  Crystal [name] is a 62 y.o. female with medical history significant of for but not limited to asthma, chronic myofascial pain, history of degenerative disc disease, diabetes mellitus type 2, endometriosis, fibromyalgia, hypothyroidism, osteoarthritis of the lumbar spine, Prinzmetal angina, reflux sympathetic dystrophy of the right lower extremity, history of seizure disorder no longer on antiepileptics, sleep apnea not on any devices at this time as well as a history of a recent right hip replacement back in November 2023 who presented to the ED yesterday with complaints of leg swelling after she was found to have a DVT.  She was placed on apixaban and discharged home with outpatient follow-up for vascular given stability and the ED discussion with the vascular surgeon.  She went home and that night she became extremely short of breath, anxious and diaphoretic and had some chest discomfort and pain so she came back for representation is found to have a bilateral PE.  Patient's chest pain and discomfort have improved and she is no longer dizzy or short of breath.  CT scan done showed bilateral PE with right heart strain and PCCM was consulted and reviewed the images and they felt that they would classify the patient as intermediate low risk pulmonary embolism suitable for admission for observation with initiation of DOAC and felt that the CT scan right heart strain was fairly minimal.  Pulmonary recommending obtaining BNP as well as echocardiogram and monitoring overnight.  She denies any other concerns or complaints at this time and burning and discomfort in her urine, abdominal discomfort,.  Patient continues to have some leg discomfort and states that she has had multiple complications from her surgery including different infections and rashes.  [facility] was [facility] for [facility] evaluation and [facility] was asked to admit this patient for observation for her new bilateral PE.    **She is improving and continues to have some chest discomfort.  Complaining of significant pain in her right hip and has a notable wound that is purulent drainage and foul-smelling odor as well as some mild dehiscence. Orthopedic Surgery notified and case was discussed with ID who recommends holding off antibiotics at this time given that she is afebrile and has no white count and no evidence of sepsis or worsening cellulitis  Given contrasted scans she now has an AKI now and will start IVF Hydration and check U/A, Urine Electrolytes.   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 -Placed in Observation Telemetry -CTA of the Chest PE protocol done and showed "Positive for acute bilateral pulmonary emboli with CT evidence of right heart strain (RV/LV Ratio = 1.09 ) consistent with at least submassive (intermediate risk) PE. The presence of right heart strain has been associated with an increased risk of morbidity and mortality. Please refer to the "Code PE Focused" order set in EPIC." -LE Venous Duplex done and showed "Acute occlusive DVT in the right femoral vein and probable acute occlusive thrombus in the right posterior tibial vein. 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 Superficial thrombophlebitis of the left great saphenous vein. Poor visualization of the right popliteal vein and nonvisualization of the right  peroneal vein." -BNP was not elevated -ECHO showed no Evidence of Right Heart Systolic Dysfunction -Resumed Apixaban but may need to transition to Heparin gtt given Below  -Takes Premarin but will hold and  stop -Obtain an ambulatory home O2 screen prior to discharge and this is pending  -Troponin x 2 are negative -Chest discomfort has improved -Resumed gentle IV fluid hydration with normal saline at 75 MLS given AKI as below    Right Hip Wound and Drainage -Noted to have a Right Hip wound with some drainage, foul smelling odor, and Slight Wound Dehiscence -Obtain Blood Cx x2 and showed NGTD at <12 Hours -Check Right Hip CT w/wo Contrast to evaluate for Deeper Infection and showed "Postsurgical changes from right total hip arthroplasty with acquired protrusion deformity with superomedial migration of the acetabular component with thinning of the medial  acetabular wall. No cortical breakthrough. No periprosthetic fracture or evidence of loosening of the femoral component. Fluid tracking along the incision site at the anterolateral aspect of the right hip. Otherwise, no appreciable periprosthetic fluid collection. Moderate degenerative changes of the left hip joint." -Hip X-Ray done and showed "Changes of right hip replacement. No hardware or bony complicating feature. No change since intraoperative imaging. No fracture, subluxation or dislocation." -Discussed with ID and Hold off Abx at this time as patient is not Septic -WOC nurse consulted -Will need Ortho to Evaluate and Treat and her Primary Orthopedic Surgeon will be notified in the AM    Hypothyroidism -Checked TSH and was 2.019 -C/w Levothyroxine 50 mcg po Daily  AKI -In the setting of two Contrasted Scans -BUN/Cr Trend: Recent Labs  Lab 12/25/22 1238 12/26/22 0721 12/27/22 1017  BUN 10 7* 8  CREATININE 0.89 0.84 1.19*  -IVF Hydration now stopped but will resume at 75 mL/hr -Check U/A, Urine Na+, Urine Cr, and Urine Osm -If not improving will obtain a Renal U/S -Avoid Nephrotoxic Medications, Contrast Dyes, Hypotension and Dehydration to Ensure Adequate Renal Perfusion and will need to Renally Adjust Meds -Continue to Monitor  and Trend Renal Function carefully and repeat CMP in the AM   Chronic Pain -Resume Home Baclofen and Meds including Methadone, Acetaminophen, and Breakthrough Oxycodne   GERD/GI Prophylaxis -C/w Pantoprazole 40 mg po Daily    Obesity -Complicates overall prognosis and care -Estimated body mass index is 38.92 kg/m as calculated from the following:   Height as of this encounter: '5\' 8"'$  (1.727 m).   Weight as of this encounter: 116.1 kg.  -Weight Loss and Dietary Counseling given  DVT prophylaxis: SCDs Start: 12/25/22 1803 apixaban (ELIQUIS) tablet 10 mg  apixaban (ELIQUIS) tablet 5 mg    Code Status: Full Code Family Communication: Discussed with Husband at bedside   Disposition Plan:  Level of care: Telemetry Status is: Observation The patient will require care spanning > 2 midnights and should be moved to inpatient because: Needs Ortho Evaluation and now has an AKI   Consultants:  Discussed with Pulmonary Orthopedic Surgery Dr. Wynelle Link   Procedures:  As delineated as above   Antimicrobials:  Anti-infectives (From admission, onward)    Start     Dose/Rate Route Frequency Ordered Stop   12/25/22 2200  acyclovir (ZOVIRAX) tablet 400 mg        400 mg Oral 2 times daily 12/25/22 1813         Subjective: Seen and examined at bedside and she is sitting in the chair and felt okay.  Thinks her chest discomfort is improved a little  bit.  Still has not ambulated or amatory home O2 screen.  Awaiting Ortho eval.  Continues to have some drainage but states that it was just yesterday.  No other concerns or close at this time.  Objective: Vitals:   12/26/22 0429 12/26/22 1303 12/26/22 2027 12/27/22 0441  BP: (!) 144/77 139/76 (!) 144/77 (!) 149/83  Pulse: 73 84 83 81  Resp: '18 16 18 18  '$ Temp: 97.6 F (36.4 C) 98.6 F (37 C) 98.7 F (37.1 C) 98.4 F (36.9 C)  TempSrc: Oral Oral Oral Oral  SpO2: 100% 99% 99% 99%  Weight:      Height:        Intake/Output Summary (Last 24  hours) at 12/27/2022 1406 Last data filed at 12/27/2022 7510 Gross per 24 hour  Intake 360 ml  Output --  Net 360 ml   Filed Weights   12/25/22 1145  Weight: 116.1 kg   Examination: Physical Exam:  Constitutional: WN/WD obese AAF in NAD appears calm sitting in the chair at bedside Respiratory: Diminished to auscultation bilaterally, no wheezing, rales, rhonchi or crackles. Normal respiratory effort and patient is not tachypenic. No accessory muscle use. Unlabored breathing  Cardiovascular: RRR, no murmurs / rubs / gallops. S1 and S2 auscultated. 1+ LE edema worse on the Right compared to the Left Abdomen: Soft, non-tender, Distended 2/2 body habitus. Bowel sounds positive.  GU: Deferred. Musculoskeletal: No clubbing / cyanosis of digits/nails. No joint deformity upper and lower extremities but has swollen legs  Skin: Has a Right Hip wound and is dressed today with some drainage  Neurologic: CN 2-12 grossly intact with no focal deficits. Romberg sign and cerebellar reflexes not assessed.  Psychiatric: Normal judgment and insight. Alert and oriented x 3. Normal mood and appropriate affect.   Data Reviewed: I have personally reviewed following labs and imaging studies  CBC: Recent Labs  Lab 12/25/22 1238 12/26/22 0721 12/27/22 1017  WBC 7.4 7.9 8.8  NEUTROABS  --  4.1 4.6  HGB 13.2 12.4 12.8  HCT 42.5 42.2 43.4  MCV 80.0 82.1 83.1  PLT 307 272 258   Basic Metabolic Panel: Recent Labs  Lab 12/25/22 1238 12/25/22 1409 12/25/22 1832 12/26/22 0721 12/27/22 1017  NA 141  --   --  138 140  K 3.8  --   --  3.8 3.5  CL 102  --   --  100 103  CO2 33*  --   --  30 28  GLUCOSE 95  --   --  98 91  BUN 10  --   --  7* 8  CREATININE 0.89  --   --  0.84 1.19*  CALCIUM 9.6  --   --  9.1 8.8*  MG  --  2.0 2.1 1.8 2.0  PHOS  --  3.7 3.7 4.0 2.6   GFR: Estimated Creatinine Clearance: 65.6 mL/min (A) (by C-G formula based on SCr of 1.19 mg/dL (H)). Liver Function Tests: Recent  Labs  Lab 12/26/22 0721 12/27/22 1017  AST 16 17  ALT 10 10  ALKPHOS 95 95  BILITOT 0.4 0.3  PROT 7.5 7.6  ALBUMIN 3.5 3.6   No results for input(s): "LIPASE", "AMYLASE" in the last 168 hours. No results for input(s): "AMMONIA" in the last 168 hours. Coagulation Profile: Recent Labs  Lab 12/25/22 1238  INR 1.4*   Cardiac Enzymes: No results for input(s): "CKTOTAL", "CKMB", "CKMBINDEX", "TROPONINI" in the last 168 hours. BNP (last 3 results) No results for  input(s): "PROBNP" in the last 8760 hours. HbA1C: No results for input(s): "HGBA1C" in the last 72 hours. CBG: Recent Labs  Lab 12/26/22 0713 12/27/22 0713  GLUCAP 91 140*   Lipid Profile: No results for input(s): "CHOL", "HDL", "LDLCALC", "TRIG", "CHOLHDL", "LDLDIRECT" in the last 72 hours. Thyroid Function Tests: Recent Labs    12/26/22 0721  TSH 2.019   Anemia Panel: No results for input(s): "VITAMINB12", "FOLATE", "FERRITIN", "TIBC", "IRON", "RETICCTPCT" in the last 72 hours. Sepsis Labs: No results for input(s): "PROCALCITON", "LATICACIDVEN" in the last 168 hours.  Recent Results (from the past 240 hour(s))  Culture, blood (Routine X 2) w Reflex to ID Panel     Status: None (Preliminary result)   Collection Time: 12/26/22  2:43 PM   Specimen: BLOOD LEFT ARM  Result Value Ref Range Status   Specimen Description   Final    BLOOD LEFT ARM BOTTLES DRAWN AEROBIC AND ANAEROBIC Performed at [facility] [address], [place], [place] [address]    Special Requests   Final    Blood Culture adequate volume Performed at [facility] [address] [place], [place] [address]    Culture   Final    NO GROWTH < 12 HOURS Performed at [facility] [address] [place], [place] [address]    Report Status PENDING  Incomplete  Culture, blood (Routine X 2) w Reflex to ID Panel     Status: None (Preliminary result)   Collection Time: 12/26/22  2:43 PM   Specimen:  BLOOD LEFT HAND  Result Value Ref Range Status   Specimen Description   Final    BLOOD LEFT HAND BOTTLES DRAWN AEROBIC ONLY Performed at [facility] [address], [place], [place] [address]    Special Requests   Final    Blood Culture adequate volume Performed at [facility] [address] [place], [place] [address]    Culture   Final    NO GROWTH < 12 HOURS Performed at [facility] [address], [place], [place] [address]    Report Status PENDING  Incomplete    Radiology Studies: CT HIP RIGHT W WO CONTRAST  Result Date: 12/26/2022 CLINICAL DATA:  Right hip pain EXAM: CT OF THE LOWER RIGHT EXTREMITY WITHOUT CONTRAST TECHNIQUE: Multidetector CT imaging of the lower right extremity was performed following the standard protocol before and during bolus administration of intravenous contrast. RADIATION DOSE REDUCTION: This exam was performed according to the departmental dose-optimization program which includes automated exposure control, adjustment of the mA and/or kV according to patient size and/or use of iterative reconstruction technique. CONTRAST:  173m OMNIPAQUE IOHEXOL 300 MG/ML  SOLN COMPARISON:  Intraoperative fluoroscopic images from 10/27/2022 FINDINGS: Bones/Joint/Cartilage Postsurgical changes from right total hip arthroplasty. Acquired protrusio deformity with superomedial migration of the acetabular component with thinning of the medial acetabular wall. No cortical breakthrough. The femoral head remains centered within the acetabular cup. No dislocation. No periprosthetic fracture. No evidence of loosening of the femoral component. There is fluid tracking along the incision site at the anterolateral aspect of the right hip. Otherwise, no appreciable periprosthetic fluid collection. Bony pelvis intact without fracture or diastasis. Moderate degenerative changes of the left hip joint. Lower lumbar spondylosis. No lytic or sclerotic  bone lesion. Ligaments Suboptimally assessed by CT. Muscles and Tendons No acute musculotendinous abnormality by CT. Soft tissues Soft tissue edema at the lateral aspect of the hip. No inguinal  lymphadenopathy. No acute abnormality is seen within the pelvis. IMPRESSION: 1. Postsurgical changes from right total hip arthroplasty with acquired protrusion deformity with superomedial migration of the acetabular component with thinning of the medial acetabular wall. No cortical breakthrough. No periprosthetic fracture or evidence of loosening of the femoral component. 2. Fluid tracking along the incision site at the anterolateral aspect of the right hip. Otherwise, no appreciable periprosthetic fluid collection. 3. Moderate degenerative changes of the left hip joint. Electronically Signed   By: Davina Poke D.O.   On: 12/26/2022 17:11   DG HIP UNILAT W OR W/O PELVIS 2-3 VIEWS RIGHT  Result Date: 12/26/2022 CLINICAL DATA:  Hip replacement, infection suspected 269010 Wound dehiscence 269010 EXAM: DG HIP (WITH OR WITHOUT PELVIS) 2-3V RIGHT COMPARISON:  10/27/2022 FINDINGS: Changes of right hip replacement. No hardware or bony complicating feature. No change since intraoperative imaging. No fracture, subluxation or dislocation. IMPRESSION: Right hip replacement.  No visible complicating feature. Electronically Signed   By: Rolm Baptise M.D.   On: 12/26/2022 17:08   ECHOCARDIOGRAM COMPLETE  Result Date: 12/26/2022    ECHOCARDIOGRAM REPORT   Patient Name:   Crystal [name] Date of Exam: 12/26/2022 Medical Rec #:  [id number]      Height:       68.0 in Accession #:    [id number]     Weight:       256.0 lb Date of Birth:  Jun 17, 1961     BSA:          2.270 m Patient Age:    65 years       BP:           144/77 mmHg Patient Gender: F              HR:           90 bpm. Exam Location:  Inpatient Procedure: 2D Echo, Cardiac Doppler, Color Doppler and Intracardiac            Opacification Agent Indications:    I26.02 Pulmonary  embolus  History:        Patient has prior history of Echocardiogram examinations, most                 recent 09/30/2008. Abnormal ECG, Arrythmias:Tachycardia,                 Signs/Symptoms:Dyspnea, Shortness of Breath and Chest Pain; Risk                 Factors:Diabetes and Hypertension.  Sonographer:    Roseanna Rainbow RDCS Referring Phys: [id number] [facility] LATIF [facility]  Sonographer Comments: Technically difficult study due to poor echo windows, suboptimal apical window, suboptimal subcostal window and patient is obese. Image acquisition challenging due to patient body habitus. Extremely difficult apicals due to habitus and high fowler's position. IMPRESSIONS  1. Right heart not well visualized but appears to be normal in size with preserved function.  2. Left ventricular ejection fraction, by estimation, is 70 to 75%. The left ventricle has hyperdynamic function. The left ventricle has no regional wall motion abnormalities. Left ventricular diastolic parameters were normal.  3. Right ventricular systolic function is normal. The right ventricular size is normal.  4. The mitral valve is normal in structure. No evidence of mitral valve regurgitation. No evidence of mitral stenosis.  5. The aortic valve is tricuspid. Aortic valve regurgitation is not visualized. No aortic stenosis is present.  6. The inferior vena cava is dilated in size with >  50% respiratory variability, suggesting right atrial pressure of 8 mmHg. FINDINGS  Left Ventricle: Left ventricular ejection fraction, by estimation, is 70 to 75%. The left ventricle has hyperdynamic function. The left ventricle has no regional wall motion abnormalities. Definity contrast agent was given IV to delineate the left ventricular endocardial borders. The left ventricular internal cavity size was normal in size. There is no left ventricular hypertrophy. Left ventricular diastolic parameters were normal. Right Ventricle: The right ventricular size is normal. Right ventricular  systolic function is normal. Left Atrium: Left atrial size was normal in size. Right Atrium: Right atrial size was normal in size. Pericardium: There is no evidence of pericardial effusion. Mitral Valve: The mitral valve is normal in structure. No evidence of mitral valve regurgitation. No evidence of mitral valve stenosis. Tricuspid Valve: The tricuspid valve is normal in structure. Tricuspid valve regurgitation is trivial. No evidence of tricuspid stenosis. Aortic Valve: The aortic valve is tricuspid. Aortic valve regurgitation is not visualized. No aortic stenosis is present. Pulmonic Valve: The pulmonic valve was normal in structure. Pulmonic valve regurgitation is not visualized. No evidence of pulmonic stenosis. Aorta: The aortic root is normal in size and structure. Venous: The inferior vena cava is dilated in size with greater than 50% respiratory variability, suggesting right atrial pressure of 8 mmHg. IAS/Shunts: No atrial level shunt detected by color flow Doppler. Additional Comments: Right heart not well visualized but appears to be normal in size with preserved function.  LEFT VENTRICLE PLAX 2D LVIDd:         4.10 cm     Diastology LVIDs:         2.20 cm     LV e' medial:    7.51 cm/s LV PW:         1.20 cm     LV E/e' medial:  8.0 LV IVS:        0.90 cm     LV e' lateral:   11.35 cm/s LVOT diam:     2.30 cm     LV E/e' lateral: 5.3 LV SV:         82 LV SV Index:   36 LVOT Area:     4.15 cm  LV Volumes (MOD) LV vol d, MOD A2C: 63.6 ml LV vol d, MOD A4C: 72.2 ml LV vol s, MOD A2C: 29.1 ml LV vol s, MOD A4C: 23.9 ml LV SV MOD A2C:     34.5 ml LV SV MOD A4C:     72.2 ml LV SV MOD BP:      41.9 ml RIGHT VENTRICLE             IVC RV S prime:     14.80 cm/s  IVC diam: 2.30 cm TAPSE (M-mode): 1.8 cm LEFT ATRIUM             Index        RIGHT ATRIUM           Index LA diam:        2.90 cm 1.28 cm/m   RA Area:     12.30 cm LA Vol (A2C):   42.2 ml 18.59 ml/m  RA Volume:   25.80 ml  11.37 ml/m LA Vol (A4C):    20.9 ml 9.21 ml/m LA Biplane Vol: 29.7 ml 13.09 ml/m  AORTIC VALVE LVOT Vmax:   104.00 cm/s LVOT Vmean:  63.800 cm/s LVOT VTI:    0.197 m  AORTA Ao Root diam: 3.00 cm Ao Asc  diam:  3.30 cm MITRAL VALVE MV Area (PHT): 3.77 cm    SHUNTS MV Decel Time: 201 msec    Systemic VTI:  0.20 m MV E velocity: 60.10 cm/s  Systemic Diam: 2.30 cm MV A velocity: 61.10 cm/s MV E/A ratio:  0.98 MV A Prime:    7.7 cm/s Kirk Ruths MD Electronically signed by Kirk Ruths MD Signature Date/Time: 12/26/2022/10:28:28 AM    Final    DG CHEST PORT 1 VIEW  Result Date: 12/26/2022 CLINICAL DATA:  Shortness of breath, dyspnea. EXAM: PORTABLE CHEST 1 VIEW COMPARISON:  Chest x-ray dated 06/26/2019. FINDINGS: Heart size and mediastinal contours are stable. Lungs are clear. No pleural effusion or pneumothorax is seen. Osseous structures about the chest are unremarkable. IMPRESSION: No active disease. No evidence of pneumonia or pulmonary edema. Electronically Signed   By: Franki Cabot M.D.   On: 12/26/2022 07:47    Scheduled Meds:  acyclovir  400 mg Oral BID   apixaban  10 mg Oral BID   Followed by   Derrill Memo ON 12/31/2022] apixaban  5 mg Oral BID   baclofen  20 mg Oral TID   cholecalciferol  2,000 Units Oral Daily   docusate sodium  100 mg Oral BID   [START ON 12/28/2022] influenza vac split quadrivalent PF  0.5 mL Intramuscular Tomorrow-1000   levothyroxine  50 mcg Oral Q0600   loratadine  10 mg Oral Daily   methadone  10 mg Oral Q12H   pantoprazole  40 mg Oral Daily   [START ON 12/28/2022] pneumococcal 20-valent conjugate vaccine  0.5 mL Intramuscular Tomorrow-1000   senna-docusate  2 tablet Oral Daily   triamcinolone  2-3 spray Topical Once per day on Mon Wed Fri   Continuous Infusions:   LOS: 0 days   Raiford Noble, DO [facility] Available via Epic secure chat 7am-7pm After these hours, please refer to coverage provider listed on [email or web address] 12/27/2022, 2:06 PM

## 2022-12-28 ENCOUNTER — Inpatient Hospital Stay (HOSPITAL_COMMUNITY): Payer: Medicare Other

## 2022-12-28 DIAGNOSIS — G8929 Other chronic pain: Secondary | ICD-10-CM | POA: Diagnosis present

## 2022-12-28 DIAGNOSIS — Y831 Surgical operation with implant of artificial internal device as the cause of abnormal reaction of the patient, or of later complication, without mention of misadventure at the time of the procedure: Secondary | ICD-10-CM | POA: Diagnosis present

## 2022-12-28 DIAGNOSIS — Z79890 Hormone replacement therapy (postmenopausal): Secondary | ICD-10-CM | POA: Diagnosis not present

## 2022-12-28 DIAGNOSIS — Z6841 Body Mass Index (BMI) 40.0 and over, adult: Secondary | ICD-10-CM | POA: Diagnosis not present

## 2022-12-28 DIAGNOSIS — N179 Acute kidney failure, unspecified: Secondary | ICD-10-CM | POA: Diagnosis not present

## 2022-12-28 DIAGNOSIS — Z993 Dependence on wheelchair: Secondary | ICD-10-CM | POA: Diagnosis not present

## 2022-12-28 DIAGNOSIS — T8131XA Disruption of external operation (surgical) wound, not elsewhere classified, initial encounter: Secondary | ICD-10-CM | POA: Diagnosis present

## 2022-12-28 DIAGNOSIS — I82411 Acute embolism and thrombosis of right femoral vein: Secondary | ICD-10-CM | POA: Diagnosis present

## 2022-12-28 DIAGNOSIS — R569 Unspecified convulsions: Secondary | ICD-10-CM

## 2022-12-28 DIAGNOSIS — Z885 Allergy status to narcotic agent status: Secondary | ICD-10-CM | POA: Diagnosis not present

## 2022-12-28 DIAGNOSIS — I82441 Acute embolism and thrombosis of right tibial vein: Secondary | ICD-10-CM | POA: Diagnosis present

## 2022-12-28 DIAGNOSIS — I82401 Acute embolism and thrombosis of unspecified deep veins of right lower extremity: Secondary | ICD-10-CM | POA: Diagnosis not present

## 2022-12-28 DIAGNOSIS — Z880 Allergy status to penicillin: Secondary | ICD-10-CM | POA: Diagnosis not present

## 2022-12-28 DIAGNOSIS — Z96641 Presence of right artificial hip joint: Secondary | ICD-10-CM | POA: Diagnosis present

## 2022-12-28 DIAGNOSIS — E669 Obesity, unspecified: Secondary | ICD-10-CM | POA: Diagnosis not present

## 2022-12-28 DIAGNOSIS — Z882 Allergy status to sulfonamides status: Secondary | ICD-10-CM | POA: Diagnosis not present

## 2022-12-28 DIAGNOSIS — Z23 Encounter for immunization: Secondary | ICD-10-CM | POA: Diagnosis present

## 2022-12-28 DIAGNOSIS — E876 Hypokalemia: Secondary | ICD-10-CM | POA: Diagnosis present

## 2022-12-28 DIAGNOSIS — M797 Fibromyalgia: Secondary | ICD-10-CM | POA: Diagnosis present

## 2022-12-28 DIAGNOSIS — E1142 Type 2 diabetes mellitus with diabetic polyneuropathy: Secondary | ICD-10-CM | POA: Diagnosis present

## 2022-12-28 DIAGNOSIS — Z79899 Other long term (current) drug therapy: Secondary | ICD-10-CM | POA: Diagnosis not present

## 2022-12-28 DIAGNOSIS — J45909 Unspecified asthma, uncomplicated: Secondary | ICD-10-CM | POA: Diagnosis present

## 2022-12-28 DIAGNOSIS — Z888 Allergy status to other drugs, medicaments and biological substances status: Secondary | ICD-10-CM | POA: Diagnosis not present

## 2022-12-28 DIAGNOSIS — I2609 Other pulmonary embolism with acute cor pulmonale: Secondary | ICD-10-CM | POA: Diagnosis present

## 2022-12-28 DIAGNOSIS — Z91018 Allergy to other foods: Secondary | ICD-10-CM | POA: Diagnosis not present

## 2022-12-28 DIAGNOSIS — Z886 Allergy status to analgesic agent status: Secondary | ICD-10-CM | POA: Diagnosis not present

## 2022-12-28 DIAGNOSIS — E039 Hypothyroidism, unspecified: Secondary | ICD-10-CM | POA: Diagnosis present

## 2022-12-28 DIAGNOSIS — F445 Conversion disorder with seizures or convulsions: Secondary | ICD-10-CM | POA: Diagnosis not present

## 2022-12-28 LAB — CBC WITH DIFFERENTIAL/PLATELET
Abs Immature Granulocytes: 0.02 10*3/uL (ref 0.00–0.07)
Basophils Absolute: 0.1 10*3/uL (ref 0.0–0.1)
Basophils Relative: 1 %
Eosinophils Absolute: 0.2 10*3/uL (ref 0.0–0.5)
Eosinophils Relative: 2 %
HCT: 42.4 % (ref 36.0–46.0)
Hemoglobin: 12.8 g/dL (ref 12.0–15.0)
Immature Granulocytes: 0 %
Lymphocytes Relative: 30 %
Lymphs Abs: 2.3 10*3/uL (ref 0.7–4.0)
MCH: 24.9 pg — ABNORMAL LOW (ref 26.0–34.0)
MCHC: 30.2 g/dL (ref 30.0–36.0)
MCV: 82.5 fL (ref 80.0–100.0)
Monocytes Absolute: 0.9 10*3/uL (ref 0.1–1.0)
Monocytes Relative: 11 %
Neutro Abs: 4.2 10*3/uL (ref 1.7–7.7)
Neutrophils Relative %: 56 %
Platelets: 282 10*3/uL (ref 150–400)
RBC: 5.14 MIL/uL — ABNORMAL HIGH (ref 3.87–5.11)
RDW: 14.2 % (ref 11.5–15.5)
WBC: 7.6 10*3/uL (ref 4.0–10.5)
nRBC: 0 % (ref 0.0–0.2)

## 2022-12-28 LAB — COMPREHENSIVE METABOLIC PANEL
ALT: 10 U/L (ref 0–44)
AST: 19 U/L (ref 15–41)
Albumin: 3.7 g/dL (ref 3.5–5.0)
Alkaline Phosphatase: 101 U/L (ref 38–126)
Anion gap: 6 (ref 5–15)
BUN: 9 mg/dL (ref 8–23)
CO2: 30 mmol/L (ref 22–32)
Calcium: 8.9 mg/dL (ref 8.9–10.3)
Chloride: 103 mmol/L (ref 98–111)
Creatinine, Ser: 1.03 mg/dL — ABNORMAL HIGH (ref 0.44–1.00)
GFR, Estimated: >60 mL/min (ref >60)
Glucose, Bld: 118 mg/dL — ABNORMAL HIGH (ref 70–99)
Potassium: 3.7 mmol/L (ref 3.5–5.1)
Sodium: 139 mmol/L (ref 135–145)
Total Bilirubin: 0.2 mg/dL — ABNORMAL LOW (ref 0.3–1.2)
Total Protein: 7.9 g/dL (ref 6.5–8.1)

## 2022-12-28 LAB — URINALYSIS, COMPLETE (UACMP) WITH MICROSCOPIC
Bilirubin Urine: NEGATIVE
Glucose, UA: NEGATIVE mg/dL
Hgb urine dipstick: NEGATIVE
Ketones, ur: NEGATIVE mg/dL
Leukocytes,Ua: NEGATIVE
Nitrite: NEGATIVE
Protein, ur: NEGATIVE mg/dL
Specific Gravity, Urine: 1.008 (ref 1.005–1.030)
pH: 7 (ref 5.0–8.0)

## 2022-12-28 LAB — MAGNESIUM: Magnesium: 1.8 mg/dL (ref 1.7–2.4)

## 2022-12-28 LAB — CREATININE, URINE, RANDOM: Creatinine, Urine: 77 mg/dL

## 2022-12-28 LAB — SODIUM, URINE, RANDOM: Sodium, Ur: 68 mmol/L

## 2022-12-28 LAB — PHOSPHORUS: Phosphorus: 2.2 mg/dL — ABNORMAL LOW (ref 2.5–4.6)

## 2022-12-28 LAB — GLUCOSE, CAPILLARY: Glucose-Capillary: 117 mg/dL — ABNORMAL HIGH (ref 70–99)

## 2022-12-28 MED ORDER — DIAZEPAM 5 MG/ML IJ SOLN
2.5000 mg | Freq: Once | INTRAMUSCULAR | Status: AC | PRN
  Administered 2022-12-28: 2.5 mg via INTRAVENOUS
  Filled 2022-12-28: qty 2

## 2022-12-28 MED ORDER — K PHOS MONO-SOD PHOS DI & MONO 155-852-130 MG PO TABS
500.0000 mg | ORAL_TABLET | Freq: Two times a day (BID) | ORAL | Status: AC
  Administered 2022-12-28 (×2): 500 mg via ORAL
  Filled 2022-12-28 (×2): qty 2

## 2022-12-28 NOTE — Progress Notes (Signed)
Pt came down for MRI. Given IV Valium before coming for exam. Pt at first didn't want to even get on to exam table stating she was claustrophobic. Reminded pt that she was given anti-anxiety meds just before leaving her room. Pt attempted to get on exam table but was unable to lay down stating "my back is fused." Pt was in pain and did not want to attempt MRI. Pt states she "barely made it through a 5 minute CT scan." MRI ordered is approx. 30 minutes long. Pt sent back to room and RN notified.

## 2022-12-28 NOTE — Progress Notes (Signed)
Please see following note from MRI:  Pt is on her way back upstairs. At first she stated she was severely claustrophobic but then we reminded her that she had valium right before coming down here. She did attempt to get on the table but she was unable to lay down. She said she barely made it through a 5 minute CT scan recently and this MRI is 30-35 minutes long.   Pt returned to room. Resting at this time.

## 2022-12-28 NOTE — Progress Notes (Signed)
Called RN regarding Routine EEG.  Patient getting ready to go for MRI now.   Will arrive in early am tomorrow 12/29/22 to complete EEG.

## 2022-12-28 NOTE — Progress Notes (Signed)
PROGRESS NOTE    Crystal [name]  WRU:[id number] DOB: 11/19/1961 DOA: 12/25/2022 PCP: Rogers Blocker, MD   Brief Narrative:  Crystal [name] is a 62 y.o. female with medical history significant of for but not limited to asthma, chronic myofascial pain, history of degenerative disc disease, diabetes mellitus type 2, endometriosis, fibromyalgia, hypothyroidism, osteoarthritis of the lumbar spine, Prinzmetal angina, reflux sympathetic dystrophy of the right lower extremity, history of seizure disorder no longer on antiepileptics, sleep apnea not on any devices at this time as well as a history of a recent right hip replacement back in November 2023 who presented to the ED yesterday with complaints of leg swelling after she was found to have a DVT.  She was placed on apixaban and discharged home with outpatient follow-up for vascular given stability and the ED discussion with the vascular surgeon.  She went home and that night she became extremely short of breath, anxious and diaphoretic and had some chest discomfort and pain so she came back for representation is found to have a bilateral PE.  Patient's chest pain and discomfort have improved and she is no longer dizzy or short of breath.  CT scan done showed bilateral PE with right heart strain and PCCM was consulted and reviewed the images and they felt that they would classify the patient as intermediate low risk pulmonary embolism suitable for admission for observation with initiation of DOAC and felt that the CT scan right heart strain was fairly minimal.  Pulmonary recommending obtaining BNP as well as echocardiogram and monitoring overnight.  She denies any other concerns or complaints at this time and burning and discomfort in her urine, abdominal discomfort,.  Patient continues to have some leg discomfort and states that she has had multiple complications from her surgery including different infections and rashes.  Patient was [facility] for [facility] evaluation and [facility] was asked to admit this patient for observation for her new bilateral PE.    **She is improving and continues to have some chest discomfort.  Complaining of significant pain in her right hip and has a notable wound that is purulent drainage and foul-smelling odor as well as some mild dehiscence. Orthopedic Surgery notified and case was discussed with ID who recommends holding off antibiotics at this time given that she is afebrile and has no white count and no evidence of sepsis or worsening cellulitis   Given contrasted scans she now has an AKI now and will start IVF Hydration and check U/A, Urine Electrolytes.  AKI is slowly improving however she never received IV fluid hydration given that she had refused.  She was ambulating today was to be discharged home however then she had 2 seizure-like episodes that were short-lived.  She did not need to receive Ativan however given her history of seizures and history of pseudoseizures neurology was consulted and recommending a full workup with an MRI as well as an EEG.  MRI pending to be done today and EEG cannot be done until tomorrow morning  Assessment and Plan:  Acute Bilateral PE with associated with Acute occlusive DVT in the right femoral vein and probable acute occlusive thrombus in the right posterior tibial vein and Chronic appearing nonocclusive mural thrombus in the right common femoral vein. -Placed in Observation Telemetry however she will need to be made inpatient now given her seizure-like activity -CTA of the Chest PE protocol done and showed "Positive for acute bilateral pulmonary emboli with CT evidence of  right heart strain (RV/LV Ratio = 1.09 ) consistent with at least submassive (intermediate risk) PE. The presence of right heart strain has been associated with an increased risk of morbidity and mortality. Please refer to the "Code PE Focused" order set in EPIC." -LE Venous Duplex done and  showed "Acute occlusive DVT in the right femoral vein and probable acute occlusive thrombus in the right posterior tibial vein. 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 Superficial thrombophlebitis of the left great saphenous vein. Poor visualization of the right popliteal vein and nonvisualization of the right peroneal vein." -BNP was not elevated -ECHO showed no Evidence of Right Heart Systolic Dysfunction -Resumed Apixaban but may need to transition to Heparin gtt given Below  -Takes Premarin but will hold and stop -Obtain an ambulatory home O2 screen prior to discharge and this is pending  -Troponin x 2 are negative -Chest discomfort has improved -Resumed gentle IV fluid hydration with normal saline at 75 MLS given AKI as below patient refused overnight and is hydrating orally.  Will continue order for now and if patient is agreeable we will continue IV fluid hydration   Right Hip Wound and Drainage -Noted to have a Right Hip wound with some drainage, foul smelling odor, and Slight Wound Dehiscence -Obtain Blood Cx x2 and showed NGTD at <12 Hours -Check Right Hip CT w/wo Contrast to evaluate for Deeper Infection and showed "Postsurgical changes from right total hip arthroplasty with acquired protrusion deformity with superomedial migration of the acetabular component with thinning of the medial  acetabular wall. No cortical breakthrough. No periprosthetic fracture or evidence of loosening of the femoral component. Fluid tracking along the incision site at the anterolateral aspect of the right hip. Otherwise, no appreciable periprosthetic fluid collection. Moderate degenerative changes of the left hip joint." -Hip X-Ray done and showed "Changes of right hip replacement. No hardware or bony complicating feature. No change since intraoperative imaging. No fracture, subluxation or dislocation." -Discussed with ID and Hold off Abx at this time as patient is not  Septic -WOC nurse consulted -Orthopedic Evaluated and they feel there is no signs of deep or superficial infection and they recommend keeping the wound dry and putting topical antimicrobial in the wound itself.  Today the wound was dressed with iodoform and sterile gauze and orthopedic surgery recommending doing daily dressing changes to continue to follow   Hypothyroidism -Checked TSH and was 2.019 -C/w Levothyroxine 50 mcg po Daily   AKI -In the setting of two Contrasted Scans -BUN/Cr Trend: Recent Labs  Lab 12/25/22 1238 12/26/22 0721 12/27/22 1017 12/28/22 1045  BUN 10 7* 8 9  CREATININE 0.89 0.84 1.19* 1.03*  -IVF Hydration now stopped but will resume at 75 mL/hr patient has refused and was drinking orally -Check U/A, Urine Na+, Urine Cr, and Urine Osm and still pending to be done -If not improving will obtain a Renal U/S -Avoid Nephrotoxic Medications, Contrast Dyes, Hypotension and Dehydration to Ensure Adequate Renal Perfusion and will need to Renally Adjust Meds -Continue to Monitor and Trend Renal Function carefully and repeat CMP in the AM    Chronic Pain -Resume Home Baclofen and Meds including Methadone, Acetaminophen, and Breakthrough Oxycodne   GERD/GI Prophylaxis -C/w Pantoprazole 40 mg po Daily    Seizure-Like Activity -Ddx Includes Seizure vs. Pseudoseizure vs. Other -Happened after Ambulating -Check MRI Brain w/wo Contrast as well as EEG -Has a Hx of Seizures/Psueudoseizures  -Neuro notified and will review the patient's chart; family  states that she has had workup for this 5 years ago but has not had a seizure in about a year and has not on any more antiepileptics -Will have the review after imaging and EEG is done and get their opinion  Obesity -Complicates overall prognosis and care -Estimated body mass index is 38.92 kg/m as calculated from the following:   Height as of this encounter: '5\' 8"'$  (1.727 m).   Weight as of this encounter: 116.1 kg.   -Weight Loss and Dietary Counseling given  DVT prophylaxis: SCDs Start: 12/25/22 1803 apixaban (ELIQUIS) tablet 10 mg  apixaban (ELIQUIS) tablet 5 mg    Code Status: Full Code Family Communication: Discussed with husband at bedside  Disposition Plan:  Level of care: Telemetry Status is: Observation The patient will require care spanning > 2 midnights and should be moved to inpatient because: She had seizure-like activity today and needs further workup and discussion with neuro   Consultants:  Discussed the case with neurology Orthopedic surgery  Procedures:  As delineated as above  Echocardiogram IMPRESSIONS     1. Right heart not well visualized but appears to be normal in size with  preserved function.   2. Left ventricular ejection fraction, by estimation, is 70 to 75%. The  left ventricle has hyperdynamic function. The left ventricle has no  regional wall motion abnormalities. Left ventricular diastolic parameters  were normal.   3. Right ventricular systolic function is normal. The right ventricular  size is normal.   4. The mitral valve is normal in structure. No evidence of mitral valve  regurgitation. No evidence of mitral stenosis.   5. The aortic valve is tricuspid. Aortic valve regurgitation is not  visualized. No aortic stenosis is present.   6. The inferior vena cava is dilated in size with >50% respiratory  variability, suggesting right atrial pressure of 8 mmHg.   FINDINGS   Left Ventricle: Left ventricular ejection fraction, by estimation, is 70  to 75%. The left ventricle has hyperdynamic function. The left ventricle  has no regional wall motion abnormalities. Definity contrast agent was  given IV to delineate the left  ventricular endocardial borders. The left ventricular internal cavity size  was normal in size. There is no left ventricular hypertrophy. Left  ventricular diastolic parameters were normal.   Right Ventricle: The right ventricular  size is normal. Right ventricular  systolic function is normal.   Left Atrium: Left atrial size was normal in size.   Right Atrium: Right atrial size was normal in size.   Pericardium: There is no evidence of pericardial effusion.   Mitral Valve: The mitral valve is normal in structure. No evidence of  mitral valve regurgitation. No evidence of mitral valve stenosis.   Tricuspid Valve: The tricuspid valve is normal in structure. Tricuspid  valve regurgitation is trivial. No evidence of tricuspid stenosis.   Aortic Valve: The aortic valve is tricuspid. Aortic valve regurgitation is  not visualized. No aortic stenosis is present.   Pulmonic Valve: The pulmonic valve was normal in structure. Pulmonic valve  regurgitation is not visualized. No evidence of pulmonic stenosis.   Aorta: The aortic root is normal in size and structure.   Venous: The inferior vena cava is dilated in size with greater than 50%  respiratory variability, suggesting right atrial pressure of 8 mmHg.   IAS/Shunts: No atrial level shunt detected by color flow Doppler.   Additional Comments: Right heart not well visualized but appears to be  normal in size  with preserved function.     LEFT VENTRICLE  PLAX 2D  LVIDd:         4.10 cm     Diastology  LVIDs:         2.20 cm     LV e' medial:    7.51 cm/s  LV PW:         1.20 cm     LV E/e' medial:  8.0  LV IVS:        0.90 cm     LV e' lateral:   11.35 cm/s  LVOT diam:     2.30 cm     LV E/e' lateral: 5.3  LV SV:         82  LV SV Index:   36  LVOT Area:     4.15 cm    LV Volumes (MOD)  LV vol d, MOD A2C: 63.6 ml  LV vol d, MOD A4C: 72.2 ml  LV vol s, MOD A2C: 29.1 ml  LV vol s, MOD A4C: 23.9 ml  LV SV MOD A2C:     34.5 ml  LV SV MOD A4C:     72.2 ml  LV SV MOD BP:      41.9 ml   RIGHT VENTRICLE             IVC  RV S prime:     14.80 cm/s  IVC diam: 2.30 cm  TAPSE (M-mode): 1.8 cm   LEFT ATRIUM             Index        RIGHT ATRIUM           Index   LA diam:        2.90 cm 1.28 cm/m   RA Area:     12.30 cm  LA Vol (A2C):   42.2 ml 18.59 ml/m  RA Volume:   25.80 ml  11.37 ml/m  LA Vol (A4C):   20.9 ml 9.21 ml/m  LA Biplane Vol: 29.7 ml 13.09 ml/m   AORTIC VALVE  LVOT Vmax:   104.00 cm/s  LVOT Vmean:  63.800 cm/s  LVOT VTI:    0.197 m    AORTA  Ao Root diam: 3.00 cm  Ao Asc diam:  3.30 cm   MITRAL VALVE  MV Area (PHT): 3.77 cm    SHUNTS  MV Decel Time: 201 msec    Systemic VTI:  0.20 m  MV E velocity: 60.10 cm/s  Systemic Diam: 2.30 cm  MV A velocity: 61.10 cm/s  MV E/A ratio:  0.98  MV A Prime:    7.7 cm/s   Antimicrobials:  Anti-infectives (From admission, onward)    Start     Dose/Rate Route Frequency Ordered Stop   12/25/22 2200  acyclovir (ZOVIRAX) tablet 400 mg        400 mg Oral 2 times daily 12/25/22 1813         Subjective: Seen and examined at bedside and she is sitting in the chair and felt okay.  Was to be discharged today and then when she was ambulating for home O2 screen she started becoming dizzy and then had seizure-like episodes which were short-lived.  She continued to complain of some lower extremity swelling worse on the right compared to left and continued to have some slight chest discomfort.  No other concerns or questions time.  Objective: Vitals:   12/27/22 2109 12/28/22 0445 12/28/22 1300 12/28/22 1350  BP: Marland Kitchen)  150/82 136/75  138/63  Pulse: 79 76  79  Resp: '20 16  18  '$ Temp: 98.1 F (36.7 C) 98.2 F (36.8 C)  98.3 F (36.8 C)  TempSrc: Oral Oral  Oral  SpO2: 98% 97% 100% 99%  Weight:      Height:        Intake/Output Summary (Last 24 hours) at 12/28/2022 1602 Last data filed at 12/28/2022 1200 Gross per 24 hour  Intake 720 ml  Output --  Net 720 ml   Filed Weights   12/25/22 1145  Weight: 116.1 kg   Examination: Physical Exam:  Constitutional: WN/WD obese African-American female currently sitting in the chair and in no acute distress Respiratory: Diminished to  auscultation bilaterally, no wheezing, rales, rhonchi or crackles. Normal respiratory effort and patient is not tachypenic. No accessory muscle use.  Unlabored breathing Cardiovascular: RRR, no murmurs / rubs / gallops. S1 and S2 auscultated.  Has 1+ lower extremity IMA worse on the right compared to the left Abdomen: Soft, non-tender, distended secondary to body habitus. Bowel sounds positive.  GU: Deferred. Musculoskeletal: No clubbing / cyanosis of digits/nails. No joint deformity upper and lower extremities.  Skin: No rashes, lesions, ulcers on limited skin evaluation and right wound is covered and packed. No induration; Warm and dry.  Neurologic: CN 2-12 grossly intact with no focal deficits. Romberg sign and cerebellar reflexes not assessed.  Psychiatric: Normal judgment and insight. Alert and oriented x 3. Normal mood and appropriate affect.   Data Reviewed: I have personally reviewed following labs and imaging studies  CBC: Recent Labs  Lab 12/25/22 1238 12/26/22 0721 12/27/22 1017 12/28/22 1045  WBC 7.4 7.9 8.8 7.6  NEUTROABS  --  4.1 4.6 4.2  HGB 13.2 12.4 12.8 12.8  HCT 42.5 42.2 43.4 42.4  MCV 80.0 82.1 83.1 82.5  PLT 307 272 293 716   Basic Metabolic Panel: Recent Labs  Lab 12/25/22 1238 12/25/22 1409 12/25/22 1832 12/26/22 0721 12/27/22 1017 12/28/22 1045  NA 141  --   --  138 140 139  K 3.8  --   --  3.8 3.5 3.7  CL 102  --   --  100 103 103  CO2 33*  --   --  '30 28 30  '$ GLUCOSE 95  --   --  98 91 118*  BUN 10  --   --  7* 8 9  CREATININE 0.89  --   --  0.84 1.19* 1.03*  CALCIUM 9.6  --   --  9.1 8.8* 8.9  MG  --  2.0 2.1 1.8 2.0 1.8  PHOS  --  3.7 3.7 4.0 2.6 2.2*   GFR: Estimated Creatinine Clearance: 75.8 mL/min (A) (by C-G formula based on SCr of 1.03 mg/dL (H)). Liver Function Tests: Recent Labs  Lab 12/26/22 0721 12/27/22 1017 12/28/22 1045  AST '16 17 19  '$ ALT '10 10 10  '$ ALKPHOS 95 95 101  BILITOT 0.4 0.3 0.2*  PROT 7.5 7.6 7.9  ALBUMIN 3.5  3.6 3.7   No results for input(s): "LIPASE", "AMYLASE" in the last 168 hours. No results for input(s): "AMMONIA" in the last 168 hours. Coagulation Profile: Recent Labs  Lab 12/25/22 1238  INR 1.4*   Cardiac Enzymes: No results for input(s): "CKTOTAL", "CKMB", "CKMBINDEX", "TROPONINI" in the last 168 hours. BNP (last 3 results) No results for input(s): "PROBNP" in the last 8760 hours. HbA1C: No results for input(s): "HGBA1C" in the last 72 hours. CBG: Recent Labs  Lab 12/26/22 0713 12/27/22 0713 12/28/22 0751  GLUCAP 91 140* 117*   Lipid Profile: No results for input(s): "CHOL", "HDL", "LDLCALC", "TRIG", "CHOLHDL", "LDLDIRECT" in the last 72 hours. Thyroid Function Tests: Recent Labs    12/26/22 0721  TSH 2.019   Anemia Panel: No results for input(s): "VITAMINB12", "FOLATE", "FERRITIN", "TIBC", "IRON", "RETICCTPCT" in the last 72 hours. Sepsis Labs: No results for input(s): "PROCALCITON", "LATICACIDVEN" in the last 168 hours.  Recent Results (from the past 240 hour(s))  Culture, blood (Routine X 2) w Reflex to ID Panel     Status: None (Preliminary result)   Collection Time: 12/26/22  2:43 PM   Specimen: BLOOD LEFT ARM  Result Value Ref Range Status   Specimen Description   Final    BLOOD LEFT ARM BOTTLES DRAWN AEROBIC AND ANAEROBIC Performed at [facility] [address], [place], [place] [address]    Special Requests   Final    Blood Culture adequate volume Performed at [facility] [address] [place], [place] [address]    Culture   Final    NO GROWTH 1 DAY Performed at [facility], [place] [address], [place], [place] [address]    Report Status PENDING  Incomplete  Culture, blood (Routine X 2) w Reflex to ID Panel     Status: None (Preliminary result)   Collection Time: 12/26/22  2:43 PM   Specimen: BLOOD LEFT HAND  Result Value Ref Range Status   Specimen Description   Final    BLOOD LEFT HAND BOTTLES  DRAWN AEROBIC ONLY Performed at [facility] [address], [place], [place] [address]    Special Requests   Final    Blood Culture adequate volume Performed at [facility] [address] [place], [place] [address]    Culture   Final    NO GROWTH 1 DAY Performed at [facility], [employer] [address], [place], [place] [address]    Report Status PENDING  Incomplete    Radiology Studies: No results found.  Scheduled Meds:  acyclovir  400 mg Oral BID   apixaban  10 mg Oral BID   Followed by   Derrill Memo ON 12/31/2022] apixaban  5 mg Oral BID   baclofen  20 mg Oral TID   cholecalciferol  2,000 Units Oral Daily   docusate sodium  100 mg Oral BID   influenza vac split quadrivalent PF  0.5 mL Intramuscular Tomorrow-1000   levothyroxine  50 mcg Oral Q0600   loratadine  10 mg Oral Daily   methadone  10 mg Oral Q12H   pantoprazole  40 mg Oral Daily   phosphorus  500 mg Oral BID   pneumococcal 20-valent conjugate vaccine  0.5 mL Intramuscular Tomorrow-1000   senna-docusate  2 tablet Oral Daily   triamcinolone  2-3 spray Topical Once per day on Mon Wed Fri   Continuous Infusions:  sodium chloride      LOS: 0 days   Raiford Noble, DO [facility] Available via Epic secure chat 7am-7pm After these hours, please refer to coverage provider listed on [email or web address] 12/28/2022, 4:02 PM

## 2022-12-28 NOTE — Progress Notes (Signed)
Subjective:    Patient seen in rounds by Dr. Wynelle Link. Denies any fever, chills, or infectious symptoms.  Objective: Vital signs in last 24 hours: Temp:  [98.1 F (36.7 C)-98.3 F (36.8 C)] 98.2 F (36.8 C) (01/16 0445) Pulse Rate:  [74-79] 76 (01/16 0445) Resp:  [16-20] 16 (01/16 0445) BP: (136-150)/(68-82) 136/75 (01/16 0445) SpO2:  [97 %-100 %] 97 % (01/16 0445)  Intake/Output from previous day:  Intake/Output Summary (Last 24 hours) at 12/28/2022 0757 Last data filed at 12/27/2022 1700 Gross per 24 hour  Intake 480 ml  Output --  Net 480 ml    Intake/Output this shift: No intake/output data recorded.  Labs: Recent Labs    12/25/22 1238 12/26/22 0721 12/27/22 1017  HGB 13.2 12.4 12.8   Recent Labs    12/26/22 0721 12/27/22 1017  WBC 7.9 8.8  RBC 5.14* 5.22*  HCT 42.2 43.4  PLT 272 293   Recent Labs    12/26/22 0721 12/27/22 1017  NA 138 140  K 3.8 3.5  CL 100 103  CO2 30 28  BUN 7* 8  CREATININE 0.84 1.19*  GLUCOSE 98 91  CALCIUM 9.1 8.8*   Recent Labs    12/25/22 1238  INR 1.4*    Exam: General - Patient is Alert and Oriented Dressing/Incision - Incision well healed except for superficial open area at top part of incision. Her pannus folds over this area and keeps it moist with odor secondary to the moisture. There is no communication with deep tissue and no drainage is noted. There is no surrounding erythema. Motor Function - intact, moving foot and toes well on exam.  Past Medical History:  Diagnosis Date   Asthma    Activity induced; improved , reports 08-06-2019 breathing now stable per patient report    Chronic fatigue syndrome    Chronic myofascial pain 1997   Dr Jason Fila   Complication of anesthesia    pseudocolonesterase deficiency   DDD (degenerative disc disease), lumbar [phone]   Dr Jenny Reichmann [facility]   Diabetes mellitus    type 2 , checks daily before breakfast     Endometriosis    Fibromyalgia 1997   Dr Jason Fila    Hypothyroidism    IBS (irritable bowel syndrome)    Interstitial cystitis    Dr Alona Bene   Morbid obesity [facility])    Osteoarthritis of lumbar spine 2018   Dr Wandra Feinstein   Peripheral neuropathy    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[place])    hadnt had sx of this in over a year    Reflex sympathetic dystrophy of right lower extremity 2002   Right toe- Dr Jason Fila;  "hasnt bothered me in a few years"    Seizures ([place])    last seizure was about 8 months ago, unsure what triggered it , say she was hospitalized at [facility] but encounter not seen in epic , does not take any seizure medication at this time , denies reoccurrence of seizure episodes since that time    Sleep apnea    no device at this time   Wears eyeglasses    Wheelchair dependent     Assessment/Plan:    Principal Problem:   Pulmonary embolism with acute cor pulmonale ([place])  Estimated body mass index is 38.92 kg/m as calculated from the following:   Height as of this encounter: '5\' 8"'$  (1.727 m).   Weight as of this encounter: 116.1 kg.  No signs of deep or superficial infection.  Wound dressed with iodoform and sterile gauze. Will do daily dressing changes and continue to follow.  R. Jaynie Bream, PA-C Orthopedic Surgery [phone] 12/28/2022, 7:57 AM

## 2022-12-29 ENCOUNTER — Inpatient Hospital Stay (HOSPITAL_COMMUNITY)
Admit: 2022-12-29 | Discharge: 2022-12-29 | Disposition: A | Discharge status: Home / Self Care | Payer: Medicare Other | Attending: Clinical Neurophysiology | Admitting: Clinical Neurophysiology

## 2022-12-29 DIAGNOSIS — R569 Unspecified convulsions: Secondary | ICD-10-CM | POA: Diagnosis not present

## 2022-12-29 DIAGNOSIS — I2609 Other pulmonary embolism with acute cor pulmonale: Secondary | ICD-10-CM | POA: Diagnosis not present

## 2022-12-29 LAB — COMPREHENSIVE METABOLIC PANEL
ALT: 9 U/L (ref 0–44)
AST: 14 U/L — ABNORMAL LOW (ref 15–41)
Albumin: 3.6 g/dL (ref 3.5–5.0)
Alkaline Phosphatase: 76 U/L (ref 38–126)
Anion gap: 5 (ref 5–15)
BUN: 9 mg/dL (ref 8–23)
CO2: 32 mmol/L (ref 22–32)
Calcium: 8.5 mg/dL — ABNORMAL LOW (ref 8.9–10.3)
Chloride: 101 mmol/L (ref 98–111)
Creatinine, Ser: 0.88 mg/dL (ref 0.44–1.00)
GFR, Estimated: >60 mL/min (ref >60)
Glucose, Bld: 98 mg/dL (ref 70–99)
Potassium: 3.4 mmol/L — ABNORMAL LOW (ref 3.5–5.1)
Sodium: 138 mmol/L (ref 135–145)
Total Bilirubin: 0.6 mg/dL (ref 0.3–1.2)
Total Protein: 6.3 g/dL — ABNORMAL LOW (ref 6.5–8.1)

## 2022-12-29 LAB — CBC WITH DIFFERENTIAL/PLATELET
Abs Immature Granulocytes: 0.03 10*3/uL (ref 0.00–0.07)
Basophils Absolute: 0.1 10*3/uL (ref 0.0–0.1)
Basophils Relative: 1 %
Eosinophils Absolute: 0.3 10*3/uL (ref 0.0–0.5)
Eosinophils Relative: 4 %
HCT: 36.9 % (ref 36.0–46.0)
Hemoglobin: 11.3 g/dL — ABNORMAL LOW (ref 12.0–15.0)
Immature Granulocytes: 0 %
Lymphocytes Relative: 44 %
Lymphs Abs: 3.6 10*3/uL (ref 0.7–4.0)
MCH: 24.7 pg — ABNORMAL LOW (ref 26.0–34.0)
MCHC: 30.6 g/dL (ref 30.0–36.0)
MCV: 80.7 fL (ref 80.0–100.0)
Monocytes Absolute: 1 10*3/uL (ref 0.1–1.0)
Monocytes Relative: 12 %
Neutro Abs: 3.1 10*3/uL (ref 1.7–7.7)
Neutrophils Relative %: 39 %
Platelets: 262 10*3/uL (ref 150–400)
RBC: 4.57 MIL/uL (ref 3.87–5.11)
RDW: 14.1 % (ref 11.5–15.5)
WBC: 8.1 10*3/uL (ref 4.0–10.5)
nRBC: 0 % (ref 0.0–0.2)

## 2022-12-29 LAB — PHOSPHORUS: Phosphorus: 4.4 mg/dL (ref 2.5–4.6)

## 2022-12-29 LAB — MAGNESIUM: Magnesium: 2 mg/dL (ref 1.7–2.4)

## 2022-12-29 LAB — GLUCOSE, CAPILLARY: Glucose-Capillary: 99 mg/dL (ref 70–99)

## 2022-12-29 MED ORDER — GERHARDT'S BUTT CREAM
TOPICAL_CREAM | Freq: Four times a day (QID) | CUTANEOUS | Status: DC | PRN
  Filled 2022-12-29: qty 1

## 2022-12-29 MED ORDER — POTASSIUM CHLORIDE CRYS ER 20 MEQ PO TBCR
40.0000 meq | EXTENDED_RELEASE_TABLET | Freq: Once | ORAL | Status: AC
  Administered 2022-12-29: 40 meq via ORAL
  Filled 2022-12-29: qty 2

## 2022-12-29 NOTE — Progress Notes (Signed)
[facility]  PROGRESS NOTE  Crystal [name] ZOX:[id number] DOB: 15-Sep-1961 DOA: 12/25/2022 PCP: Rogers Blocker, MD   Brief HPI:   62 y.o. female with medical history significant of for but not limited to asthma, chronic myofascial pain, history of degenerative disc disease, diabetes mellitus type 2, endometriosis, fibromyalgia, hypothyroidism, osteoarthritis of the lumbar spine, Prinzmetal angina, reflux sympathetic dystrophy of the right lower extremity, history of seizure disorder no longer on antiepileptics, sleep apnea not on any devices at this time as well as a history of a recent right hip replacement back in November 2023 who presented to the ED yesterday with complaints of leg swelling after she was found to have a DVT.  She was placed on apixaban and discharged home with outpatient follow-up for vascular given stability and the ED discussion with the vascular surgeon.  She went home and that night she became extremely short of breath, anxious and diaphoretic and had some chest discomfort and pain so she came back for representation is found to have a bilateral PE. PCCM was consulted and reviewed the images and they felt that they would classify the patient as intermediate low risk pulmonary embolism suitable for admission for observation with initiation of DOAC and felt that the CT scan right heart strain was fairly minimal.    Subjective   Patient had?  Seizure-like episode yesterday while ambulating.  EEG was obtained which was negative.  MRI brain could not be obtained as patient has claustrophobia and also cannot lay on her back due to chronic back pain.  She denies shortness of breath.   Assessment/Plan:    Acute bilateral PE -Occlusive DVT in the right femoral vein -CTA chest was consistent with right heart strain with at least submassive PE -Lower extremity venous duplex was positive for acute occlusive DVT in the right femoral vein -Echocardiogram showed no evidence of  right heart systolic dysfunction -Initially started on heparin; switched to apixaban -Patient takes Premarin at home; currently on hold  Seizure versus pseudoseizure -Patient states that she was diagnosed with nonepileptic spells in 2009 -Patient says that she regularly has these spells at home; last one was more than a year ago -Unable to find documentation regarding nonepileptic seizures -EEG obtained during this admission was negative -Will consult neurology for further recommendations  Right hip wound and drainage -Patient is s/p right total hip arthroplasty; 8 weeks ago -CT of right hip showed "Postsurgical changes from right total hip arthroplasty with acquired protrusion deformity with superomedial migration of the acetabular component with thinning of the medial  acetabular wall. No cortical breakthrough. No periprosthetic fracture or evidence of loosening of the femoral component. Fluid tracking along the incision site at the anterolateral aspect of the right hip  -Orthopedics consulted, no signs of deep flexion, only superficial wound of surgical site.  Recommend WBAT.  -Orthopedics following for daily dressing changes  Hypothyroidism -Continue Synthroid 50 mcg p.o. daily  Acute kidney injury -Creatinine improving, 1.03 -?  Contrast-induced injury  Chronic pain -Continue baclofen, methadone -Continue as needed oxycodone  Hypokalemia -Potassium is 3.4 -Replace potassium and follow BMP in am   Obesity -BMI 38.92 kg/m      Medications     acyclovir  400 mg Oral BID   apixaban  10 mg Oral BID   Followed by   Derrill Memo ON 12/31/2022] apixaban  5 mg Oral BID   baclofen  20 mg Oral TID   cholecalciferol  2,000 Units Oral Daily   docusate sodium  100 mg Oral BID   influenza vac split quadrivalent PF  0.5 mL Intramuscular Tomorrow-1000   levothyroxine  50 mcg Oral Q0600   loratadine  10 mg Oral Daily   methadone  10 mg Oral Q12H   pantoprazole  40 mg Oral Daily    senna-docusate  2 tablet Oral Daily   triamcinolone  2-3 spray Topical Once per day on Mon Wed Fri     Data Reviewed:   CBG:  Recent Labs  Lab 12/26/22 0713 12/27/22 0713 12/28/22 0751 12/29/22 0752  GLUCAP 91 140* 117* 99    SpO2: 97 % O2 Flow Rate (L/min): 2 L/min    Vitals:   12/28/22 1350 12/28/22 2157 12/29/22 0453 12/29/22 1321  BP: 138/63 123/64 118/69 (!) 125/52  Pulse: 79 68 76 80  Resp: '18 14 14 18  '$ Temp: 98.3 F (36.8 C) 98.2 F (36.8 C) 98 F (36.7 C) 98.2 F (36.8 C)  TempSrc: Oral Oral Oral Oral  SpO2: 99% 97% 98% 97%  Weight:   127.7 kg   Height:          Data Reviewed:  Basic Metabolic Panel: Recent Labs  Lab 12/25/22 1238 12/25/22 1409 12/25/22 1832 12/26/22 0721 12/27/22 1017 12/28/22 1045 12/29/22 0624  NA 141  --   --  138 140 139 138  K 3.8  --   --  3.8 3.5 3.7 3.4*  CL 102  --   --  100 103 103 101  CO2 33*  --   --  '30 28 30 '$ 32  GLUCOSE 95  --   --  98 91 118* 98  BUN 10  --   --  7* '8 9 9  '$ CREATININE 0.89  --   --  0.84 1.19* 1.03* 0.88  CALCIUM 9.6  --   --  9.1 8.8* 8.9 8.5*  MG  --    < > 2.1 1.8 2.0 1.8 2.0  PHOS  --    < > 3.7 4.0 2.6 2.2* 4.4   < > = values in this interval not displayed.    CBC: Recent Labs  Lab 12/25/22 1238 12/26/22 0721 12/27/22 1017 12/28/22 1045 12/29/22 0624  WBC 7.4 7.9 8.8 7.6 8.1  NEUTROABS  --  4.1 4.6 4.2 3.1  HGB 13.2 12.4 12.8 12.8 11.3*  HCT 42.5 42.2 43.4 42.4 36.9  MCV 80.0 82.1 83.1 82.5 80.7  PLT 307 272 293 282 262    LFT Recent Labs  Lab 12/26/22 0721 12/27/22 1017 12/28/22 1045 12/29/22 0624  AST '16 17 19 '$ 14*  ALT '10 10 10 9  '$ ALKPHOS 95 95 101 76  BILITOT 0.4 0.3 0.2* 0.6  PROT 7.5 7.6 7.9 6.3*  ALBUMIN 3.5 3.6 3.7 3.6     Antibiotics: Anti-infectives (From admission, onward)    Start     Dose/Rate Route Frequency Ordered Stop   12/25/22 2200  acyclovir (ZOVIRAX) tablet 400 mg        400 mg Oral 2 times daily 12/25/22 1813          DVT  prophylaxis: Eliquis  Code Status: Full code  Family Communication: Discussed with patient's husband at bedside   CONSULTS    Objective    Physical Examination:   General-appears in no acute distress Heart-S1-S2, regular, no murmur auscultated Lungs-clear to auscultation bilaterally, no wheezing or crackles auscultated Abdomen-soft, nontender, no organomegaly Extremities-no edema in the lower extremities Neuro-alert, oriented x3, no focal deficit noted   Status  is: Inpatient: Pulmonary embolism    Pressure Injury 12/25/22 Coccyx Mid Stage 2 -  Partial thickness loss of dermis presenting as a shallow open injury with a red, pink wound bed without slough. healing pressure wound accquired after pt's hip replacement surgery (Active)  12/25/22 1912  Location: Coccyx  Location Orientation: Mid  Staging: Stage 2 -  Partial thickness loss of dermis presenting as a shallow open injury with a red, pink wound bed without slough.  Wound Description (Comments): healing pressure wound accquired after pt's hip replacement surgery  Present on Admission: Yes        [place]   [facility] If 7PM-7AM, please contact night-coverage at [email or web address], Office  [phone]   12/29/2022, 4:37 PM  LOS: 1 day

## 2022-12-29 NOTE — Procedures (Signed)
Patient Name: Crystal [name]  MRN: [id number]  Epilepsy Attending: Lora Havens  Referring Physician/Provider: Kerney Elbe, DO  Date: 12/29/2022  Duration: 25 mins  Patient history: 62 year old female with seizure-like activity.  EEG to evaluate for seizure.  Level of alertness: Awake, asleep  AEDs during EEG study: None  Technical aspects: This EEG study was done with scalp electrodes positioned according to the 10-20 International system of electrode placement. Electrical activity was reviewed with band pass filter of 1-'70Hz'$ , sensitivity of 7 uV/mm, display speed of 35m/sec with a '60Hz'$  notched filter applied as appropriate. EEG data were recorded continuously and digitally stored.  Video monitoring was available and reviewed as appropriate.  Description: The posterior dominant rhythm consists of 8-9 Hz activity of moderate voltage (25-35 uV) seen predominantly in posterior head regions, symmetric and reactive to eye opening and eye closing. Sleep was characterized by vertex waves, sleep spindles (12 to 14 Hz), maximal frontocentral region.  Hyperventilation and photic stimulation were not performed.     IMPRESSION: This study is within normal limits. No seizures or epileptiform discharges were seen throughout the recording.  A normal interictal EEG does not exclude the diagnosis of epilepsy.  Elmire Amrein OBarbra Sarks

## 2022-12-29 NOTE — Progress Notes (Signed)
[place] for Eliquis Indication: pulmonary embolus  Allergies  Allergen Reactions   Amoxicillin     Severe yeast infection causing lesions and rawness   Celery (Apium Graveolens Var. Dulce) Skin Test     Numbness and tingling, bitter taste and prickling on the tongue   Flagyl [Metronidazole] Itching and Swelling   Morphine Nausea And Vomiting   Nsaids Nausea Only    Headaches   Other     Metals: Nickel, cat dander, dog dander, hickery, ash, walnut, lambs quarter, dust, ragweed, dustmites, difar, sawdust, tar   Finasteride Itching, Swelling and Rash    redness   Sulfonamide Derivatives Itching and Rash    Blisters in throat     Patient Measurements: Height: '5\' 8"'$  (172.7 cm) Weight: 127.7 kg (281 lb 8.4 oz) IBW/kg (Calculated) : 63.9 Heparin Dosing Weight: 90.7kg  Vital Signs: Temp: 98 F (36.7 C) (01/17 0453) Temp Source: Oral (01/17 0453) BP: 118/69 (01/17 0453) Pulse Rate: 76 (01/17 0453)  Labs: Recent Labs    12/27/22 1017 12/28/22 1045 12/29/22 0624  HGB 12.8 12.8 11.3*  HCT 43.4 42.4 36.9  PLT 293 282 262  CREATININE 1.19* 1.03* 0.88     Estimated Creatinine Clearance: 93.5 mL/min (by C-G formula based on SCr of 0.88 mg/dL).   Medical History: Past Medical History:  Diagnosis Date   Asthma    Activity induced; improved , reports 08-06-2019 breathing now stable per patient report    Chronic fatigue syndrome    Chronic myofascial pain 1997   Dr Jason Fila   Complication of anesthesia    pseudocolonesterase deficiency   DDD (degenerative disc disease), lumbar [phone]   Dr Jenny Reichmann Rendall   Diabetes mellitus    type 2 , checks daily before breakfast     Endometriosis    Fibromyalgia 1997   Dr Jason Fila   Hypothyroidism    IBS (irritable bowel syndrome)    Interstitial cystitis    Dr Alona Bene   Morbid obesity [facility])    Osteoarthritis of lumbar spine 2018   Dr Wandra Feinstein   Peripheral neuropathy     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[place])    hadnt had sx of this in over a year    Reflex sympathetic dystrophy of right lower extremity 2002   Right toe- Dr Jason Fila;  "hasnt bothered me in a few years"    Seizures ([place])    last seizure was about 8 months ago, unsure what triggered it , say she was hospitalized at [facility] but encounter not seen in epic , does not take any seizure medication at this time , denies reoccurrence of seizure episodes since that time    Sleep apnea    no device at this time   Wears eyeglasses    Wheelchair dependent     Assessment: 70 YOF presenting with CP, recent DVT dx 1/12, Ct angio chest shows PE with evidence of RHS.  New Eliquis start yesterday 1/12 PM, Pharmacy consulted to continue dosing.  Today, 12/29/2022 Pharmacy education provided 1/14 CBC: hgb slightly decreased, plt WNL SCr WNL No bleeding reported  Goal of Therapy:  Therapeutic anticoagulation   Plan:  Continue with Eliquis '10mg'$  BID x 2 more days, then '5mg'$  BID thereafter No dose adjustments anticipated and Pharmacy will sign off on consult but continue to monitor CBC as ordered by provider and signs/symptoms of bleeding  Thank you for allowing pharmacy to be a part of this patient's care.  Ketrick Matney  Alyson Locket, PharmD, BCPS Clinical Pharmacist [place] Please utilize Amion for appropriate phone number to reach the unit pharmacist ([place]) 12/29/2022 8:50 AM

## 2022-12-29 NOTE — TOC Progression Note (Signed)
Transition of Care [facility]) - Progression Note    Patient Details  Name: Crystal [name] MRN: [id number] Date of Birth: October 12, 1961  Transition of Care [facility]) CM/SW [place], RN Phone Number:[phone]  12/29/2022, 10:09 AM  Clinical Narrative:    CM received message from [facility] with [place] stating that patient is currently active with [place] for [facility] PT.         Expected Discharge Plan and Services                                               Social Determinants of Health (SDOH) Interventions SDOH Screenings   Food Insecurity: No Food Insecurity (12/25/2022)  Housing: Low Risk  (12/25/2022)  Transportation Needs: No Transportation Needs (12/25/2022)  Utilities: Not At Risk (12/25/2022)  Tobacco Use: Low Risk  (12/26/2022)    Readmission Risk Interventions     No data to display

## 2022-12-29 NOTE — Progress Notes (Signed)
Subjective:    Patient reports pain as mild.   Patient seen in rounds for Dr. Wynelle Link. Patient is recently admitted for PE   She denies fevers, chills.  Objective: Vital signs in last 24 hours: Temp:  [98 F (36.7 C)-98.3 F (36.8 C)] 98 F (36.7 C) (01/17 0453) Pulse Rate:  [68-79] 76 (01/17 0453) Resp:  [14-18] 14 (01/17 0453) BP: (118-138)/(63-69) 118/69 (01/17 0453) SpO2:  [97 %-100 %] 98 % (01/17 0453) Weight:  [127.7 kg] 127.7 kg (01/17 0453)  Intake/Output from previous day:  Intake/Output Summary (Last 24 hours) at 12/29/2022 1127 Last data filed at 12/28/2022 1620 Gross per 24 hour  Intake 240 ml  Output 700 ml  Net -460 ml     Intake/Output this shift: No intake/output data recorded.  Labs: Recent Labs    12/27/22 1017 12/28/22 1045 12/29/22 0624  HGB 12.8 12.8 11.3*   Recent Labs    12/28/22 1045 12/29/22 0624  WBC 7.6 8.1  RBC 5.14* 4.57  HCT 42.4 36.9  PLT 282 262   Recent Labs    12/28/22 1045 12/29/22 0624  NA 139 138  K 3.7 3.4*  CL 103 101  CO2 30 32  BUN 9 9  CREATININE 1.03* 0.88  GLUCOSE 118* 98  CALCIUM 8.9 8.5*   No results for input(s): "LABPT", "INR" in the last 72 hours.  Exam: General - Patient is Alert, Appropriate, and Oriented Extremity - Neurovascular intact Dressing - scant drainage. Approx nickel sized superficial wound to superior aspect of incision. No active drainage. No surrounding erythema or palpable fluid collection.  Motor Function - intact, moving foot and toes well on exam.   Past Medical History:  Diagnosis Date   Asthma    Activity induced; improved , reports 08-06-2019 breathing now stable per patient report    Chronic fatigue syndrome    Chronic myofascial pain 1997   Dr Jason Fila   Complication of anesthesia    pseudocolonesterase deficiency   DDD (degenerative disc disease), lumbar [phone]   Dr Jenny Reichmann Rendall   Diabetes mellitus    type 2 , checks daily before breakfast     Endometriosis     Fibromyalgia 1997   Dr Jason Fila   Hypothyroidism    IBS (irritable bowel syndrome)    Interstitial cystitis    Dr Alona Bene   Morbid obesity [facility])    Osteoarthritis of lumbar spine 2018   Dr Wandra Feinstein   Peripheral neuropathy    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[place])    hadnt had sx of this in over a year    Reflex sympathetic dystrophy of right lower extremity 2002   Right toe- Dr Jason Fila;  "hasnt bothered me in a few years"    Seizures ([place])    last seizure was about 8 months ago, unsure what triggered it , say she was hospitalized at [facility] but encounter not seen in epic , does not take any seizure medication at this time , denies reoccurrence of seizure episodes since that time    Sleep apnea    no device at this time   Wears eyeglasses    Wheelchair dependent     Assessment/Plan:    Principal Problem:   Pulmonary embolism with acute cor pulmonale ([place])  8 weeks s/p right total hip arthroplasty with superficial wound of surgical site WBAT Continue daily dressing changes with iodoform gauze, sterile 4x4, paper tape. We will continue to follow and perform daily dressing changes while  she is in the hospital. Dressing change instructions discussed with patient and husband.   Shearon Balo, PA-C Orthopedic Surgery 12/29/2022, 11:27 AM

## 2022-12-29 NOTE — Progress Notes (Signed)
EEG complete - results pending 

## 2022-12-30 DIAGNOSIS — F445 Conversion disorder with seizures or convulsions: Secondary | ICD-10-CM

## 2022-12-30 DIAGNOSIS — I2609 Other pulmonary embolism with acute cor pulmonale: Secondary | ICD-10-CM | POA: Diagnosis not present

## 2022-12-30 LAB — BASIC METABOLIC PANEL
Anion gap: 9 (ref 5–15)
BUN: 11 mg/dL (ref 8–23)
CO2: 29 mmol/L (ref 22–32)
Calcium: 9.1 mg/dL (ref 8.9–10.3)
Chloride: 100 mmol/L (ref 98–111)
Creatinine, Ser: 0.82 mg/dL (ref 0.44–1.00)
GFR, Estimated: >60 mL/min (ref >60)
Glucose, Bld: 115 mg/dL — ABNORMAL HIGH (ref 70–99)
Potassium: 4 mmol/L (ref 3.5–5.1)
Sodium: 138 mmol/L (ref 135–145)

## 2022-12-30 LAB — OSMOLALITY, URINE: Osmolality, Ur: 322 mOsm/kg (ref 300–900)

## 2022-12-30 LAB — GLUCOSE, CAPILLARY: Glucose-Capillary: 123 mg/dL — ABNORMAL HIGH (ref 70–99)

## 2022-12-30 MED ORDER — ZINC OXIDE 40 % EX OINT: TOPICAL_OINTMENT | Freq: Every day | CUTANEOUS | Status: DC | PRN

## 2022-12-30 NOTE — Consult Note (Signed)
Neurology Consultation  Reason for Consult: seizure like activity Referring Physician: Dr.Lama   History is obtained from:patient and medical record   HPI: Crystal [name] is a 62 y.o. 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 Two days ago she was working on being discharged and she was walking with therapy when she had 2 seizure like episodes. She remembers all events of the episodes. She states she was walking with therapy when she started to become SOB, she leaned over the walker felt lightheaded, and she was placed in a wheelchair and brought back to her room. She then was assisted to the chair when her upper body started shaking, this happened twice each lasting 60 seconds and then was assisted into the bed where she slept off the day. She states these episodes are exactly like all her other episodes in the past, she gets fogginess just prior to event and then has bilateral upper extremity shaking and she lays down and takes a nap and then she is better.  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 She has never been on seizure medication. She said at one time the doctors felt it was caused by orthostatic blood pressure and told her to move slow when changing positions. She states she has not had an episode in about 1 year. She also states has not had any episodes in the hospital since 2 days ago  Neurology consulted for assistance    ROS: Full ROS was performed and is negative except as noted in the HPI.   Past Medical History:  Diagnosis Date   Asthma    Activity  induced; improved , reports 08-06-2019 breathing now stable per patient report    Chronic fatigue syndrome    Chronic myofascial pain 1997   Dr Jason Fila   Complication of anesthesia    pseudocolonesterase deficiency   DDD (degenerative disc disease), lumbar [phone]   Dr Jenny Reichmann Rendall   Diabetes mellitus    type 2 , checks daily before breakfast     Endometriosis    Fibromyalgia 1997   Dr Jason Fila   Hypothyroidism    IBS (irritable bowel syndrome)    Interstitial cystitis    Dr Alona Bene   Morbid obesity [facility])    Osteoarthritis of lumbar spine 2018   Dr Wandra Feinstein   Peripheral neuropathy    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 No pertinent family  history.   Social History:   reports that she has never smoked. She has never used smokeless tobacco. She reports that she does not drink alcohol and does not use drugs.  Medications  Current Facility-Administered Medications:    0.9 %  sodium chloride infusion, , Intravenous, Continuous, Sheikh, Omair Latif, DO   acetaminophen (TYLENOL) tablet 1,000 mg, 1,000 mg, Oral, Q8H PRN, Sheikh, Omair Latif, DO   acyclovir (ZOVIRAX) tablet 400 mg, 400 mg, Oral, BID, Sheikh, Omair Latif, DO, 400 mg at 12/30/22 1051   albuterol (PROVENTIL) (2.5 MG/3ML) 0.083% nebulizer solution 2.5 mg, 2.5 mg, Nebulization, Q6H PRN, Suzzanne Cloud, RPH   apixaban (ELIQUIS) tablet 10 mg, 10 mg, Oral, BID, 10 mg at 12/30/22 1050  **FOLLOWED BY** [START ON 12/31/2022] apixaban (ELIQUIS) tablet 5 mg, 5 mg, Oral, BID, Maricela Bo, Erin R, RPH   baclofen (LIORESAL) tablet 20 mg, 20 mg, Oral, TID, Sheikh, Omair Latif, DO, 20 mg at 12/30/22 1052   cholecalciferol (VITAMIN D3) 25 MCG (1000 UNIT) tablet 2,000 Units, 2,000 Units, Oral, Daily, Sheikh, Omair Latif, DO, 2,000 Units at 12/30/22 1050   docusate sodium (COLACE) capsule 100 mg, 100 mg, Oral, BID, Sheikh, Omair Latif, DO, 100 mg at 12/30/22 1050   Gerhardt's butt cream, , Topical, QID PRN, Raenette Rover, NP, Given at 12/29/22 2211   glucose chewable tablet 4 g, 1 tablet, Oral, PRN, Alfredia Ferguson, Omair Latif, DO   hydrALAZINE (APRESOLINE) injection 10 mg, 10 mg, Intravenous, Q6H PRN, Sheikh, Omair Latif, DO   influenza vac split quadrivalent PF (FLUARIX) injection 0.5 mL, 0.5 mL, Intramuscular, Tomorrow-1000, Sheikh, Omair Latif, DO   ipratropium-albuterol (DUONEB) 0.5-2.5 (3) MG/3ML nebulizer solution 3 mL, 3 mL, Nebulization, Q4H PRN, Sheikh, Omair Latif, DO   levothyroxine (SYNTHROID) tablet 50 mcg, 50 mcg, Oral, Q0600, Raiford Noble [place], DO, 50 mcg at 12/30/22 7035   liver oil-zinc oxide (DESITIN) 40 % ointment, , Topical, Daily PRN, Darrick Meigs, Marge Duncans, MD   loratadine (CLARITIN) tablet 10 mg, 10 mg, Oral, Daily, Sheikh, Omair Latif, DO, 10 mg at 12/30/22 1052   methadone (DOLOPHINE) tablet 10 mg, 10 mg, Oral, Q12H, Sheikh, Omair Latif, DO, 10 mg at 12/30/22 1052   methocarbamol (ROBAXIN) tablet 500 mg, 500 mg, Oral, Q6H PRN, Sheikh, Omair Latif, DO   ondansetron [facility]) tablet 4 mg, 4 mg, Oral, Q6H PRN **OR** ondansetron (ZOFRAN) injection 4 mg, 4 mg, Intravenous, Q6H PRN, Sheikh, Omair Latif, DO   oxyCODONE (Oxy IR/ROXICODONE) immediate release tablet 5-10 mg, 5-10 mg, Oral, Q6H PRN, Sheikh, Omair Latif, DO, 10 mg at 12/26/22 1620   pantoprazole (PROTONIX) EC tablet 40 mg, 40 mg, Oral, Daily, Sheikh, Omair Latif, DO, 40 mg at 12/30/22 1052   senna-docusate (Senokot-S) tablet 2  tablet, 2 tablet, Oral, Daily, Sheikh, Omair Latif, DO, 2 tablet at 12/30/22 1049   triamcinolone (KENALOG) topical spray 2-3 spray, 2-3 spray, Topical, Once per day on Mon Wed Fri, Sheikh, Omair Latif, DO   Exam: Current vital signs: BP 127/64 (BP Location: Left Arm)   Pulse 71   Temp 98.2 F (36.8 C) (Oral)   Resp 18   Ht '5\' 8"'$  (1.727 m)   Wt 127.7 kg   SpO2 97%   BMI 42.81 kg/m  Vital signs in last 24 hours: Temp:  [98.2 F (36.8 C)-98.8 F (37.1 C)] 98.2 F (36.8 C) (01/18 0424) Pulse Rate:  [71-81] 71 (01/18 0424) Resp:  [18] 18 (01/18 0424) BP: (125-143)/(52-76) 127/64 (01/18 0424) SpO2:  [97 %-98 %] 97 % (  01/18 0424)  GENERAL: Awake, alert in NAD HEENT: - Normocephalic and atraumatic, dry mm LUNGS - Clear to auscultation bilaterally with no wheezes CV - S1S2 RRR, no m/r/g, equal pulses bilaterally. ABDOMEN - Soft, nontender, nondistended with normoactive BS Ext: warm, well perfused, intact peripheral pulses, mild bilateral lower edema  NEURO:  Mental Status: AA&Ox4 Language: speech is cleaar.  Naming, repetition, fluency, and comprehension intact. Cranial Nerves: PERRL EOMI, visual fields full, no facial asymmetry, facial sensation intact, hearing intact, tongue/uvula/soft palate midline, normal sternocleidomastoid and trapezius muscle strength. No evidence of tongue atrophy or fibrillations Motor: 5/5 in bilateral uppers, 4/5 in bilateral lowers  Tone: is normal and bulk is normal Sensation- Intact to light touch bilaterally Coordination: FTN intact bilaterally, no ataxia in BLE. Gait- deferred  Labs I have reviewed labs in epic and the results pertinent to this consultation are:  CBC    Component Value Date/Time   WBC 8.1 12/29/2022 0624   RBC 4.57 12/29/2022 0624   HGB 11.3 (L) 12/29/2022 0624   HCT 36.9 12/29/2022 0624   PLT 262 12/29/2022 0624   MCV 80.7 12/29/2022 0624   MCH 24.7 (L) 12/29/2022 0624   MCHC 30.6 12/29/2022 0624   RDW 14.1  12/29/2022 0624   LYMPHSABS 3.6 12/29/2022 0624   MONOABS 1.0 12/29/2022 0624   EOSABS 0.3 12/29/2022 0624   BASOSABS 0.1 12/29/2022 0624    CMP     Component Value Date/Time   NA 138 12/30/2022 0600   NA 141 02/18/2012 0000   K 4.0 12/30/2022 0600   CL 100 12/30/2022 0600   CO2 29 12/30/2022 0600   GLUCOSE 115 (H) 12/30/2022 0600   BUN 11 12/30/2022 0600   CREATININE 0.82 12/30/2022 0600   CALCIUM 9.1 12/30/2022 0600   PROT 6.3 (L) 12/29/2022 0624   ALBUMIN 3.6 12/29/2022 0624   AST 14 (L) 12/29/2022 0624   ALT 9 12/29/2022 0624   ALKPHOS 76 12/29/2022 0624   BILITOT 0.6 12/29/2022 0624   GFRNONAA >60 12/30/2022 0600   GFRAA 84 (L) 05/11/2012 1947    Lipid Panel     Component Value Date/Time   CHOL  09/29/2008 0600    128        ATP III CLASSIFICATION:  <200     mg/dL   Desirable  200-239  mg/dL   Borderline High  >=240    mg/dL   High   TRIG 202 (H) 09/29/2008 0600   HDL 51 09/29/2008 0600   CHOLHDL 2.5 09/29/2008 0600   VLDL 40 09/29/2008 0600   LDLCALC  09/29/2008 0600    37        Total Cholesterol/HDL:CHD Risk Coronary Heart Disease Risk Table                     Men   Women  1/2 Average Risk   3.4   3.3     Imaging I have reviewed the images obtained: none   Was not able to tolerate MRI due to claustrophobia and inability to lay flat due to chronic back pain   EEG 1/17:This study is within normal limits. No seizures or epileptiform discharges were seen throughout the recording.   Assessment:  62 y.o. 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 wo days ago she was working on being discharged and she was walking with therapy when she had 2 seizure like episodes. 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 No indication for further workup or AEDs at this time.  Psychogenic Spells   Recommendations: - No need to start AEDs at this time as this was a typical spell she has had in the past with a negative workup including EEG that captured spells with no EEG correlate  - Ok from neurology standpoint to be discharged home   [place], [place]  [facility]   Attending Neurohospitalist Addendum Patient seen and examined with APP/Resident. Agree with the history and physical as documented above. Agree with the plan as documented, which I helped formulate. I have edited the note above to reflect my full findings and recommendations. I have independently reviewed the chart, obtained history, review of systems and examined the patient.I have personally reviewed pertinent head/neck/spine imaging (CT/MRI). Please feel free to call with any questions.  -- Su Monks, MD [facility] [phone]  If 7pm- 7am, please page neurology on call as listed in [place].

## 2022-12-30 NOTE — Discharge Summary (Addendum)
Physician Discharge Summary   Patient: Crystal [name] MRN: [id number] DOB: 24-Dec-1960  Admit date:     12/25/2022  Discharge date: 12/30/22  Discharge Physician: Oswald Hillock   PCP: Rogers Blocker, MD   Recommendations at discharge:    Follow-up PCP in 1 week  Discharge Diagnoses: Principal Problem:   Pulmonary embolism with acute cor pulmonale (HCC)  Resolved Problems:   * No resolved hospital problems. *  Hospital Course:  62 y.o. female with medical history significant of for but not limited to asthma, chronic myofascial pain, history of degenerative disc disease, diabetes mellitus type 2, endometriosis, fibromyalgia, hypothyroidism, osteoarthritis of the lumbar spine, Prinzmetal angina, reflux sympathetic dystrophy of the right lower extremity, history of seizure disorder no longer on antiepileptics, sleep apnea not on any devices at this time as well as a history of a recent right hip replacement back in November 2023 who presented to the ED yesterday with complaints of leg swelling after she was found to have a DVT.  She was placed on apixaban and discharged home with outpatient follow-up for vascular given stability and the ED discussion with the vascular surgeon.  She went home and that night she became extremely short of breath, anxious and diaphoretic and had some chest discomfort and pain so she came back for representation is found to have a bilateral PE. PCCM was consulted and reviewed the images and they felt that they would classify the patient as intermediate low risk pulmonary embolism suitable for admission for observation with initiation of DOAC and felt that the CT scan right heart strain was fairly minimal.  Assessment and Plan:  Acute bilateral PE -Occlusive DVT in the right femoral vein -CTA chest was consistent with right heart strain with at least submassive PE -Lower extremity venous duplex was positive for acute occlusive DVT in the right femoral  vein -Echocardiogram showed no evidence of right heart systolic dysfunction -Initially started on heparin; switched to apixaban -Patient takes Premarin at home; currently on hold -Will discontinue Premarin   Seizure versus pseudoseizure -Patient states that she was diagnosed with nonepileptic spells in 2009 -Patient says that she regularly has these spells at home; last one was more than a year ago -Unable to find documentation regarding nonepileptic seizures -EEG obtained during this admission was negative -Neurology was consulted, they did not feel that patient had a seizure.  It was more like a nonepileptic spell she has had in the past. -No further imaging studies recommended, no AEDs at this time.   Right hip wound and drainage -Patient is s/p right total hip arthroplasty; 8 weeks ago -CT of right hip showed "Postsurgical changes from right total hip arthroplasty with acquired protrusion deformity with superomedial migration of the acetabular component with thinning of the medial  acetabular wall. No cortical breakthrough. No periprosthetic fracture or evidence of loosening of the femoral component. Fluid tracking along the incision site at the anterolateral aspect of the right hip  -Orthopedics consulted, no signs of deep flexion, only superficial wound of surgical site.  Recommend WBAT.  -Orthopedics following for daily dressing changes -Patient to do daily dressing changes as per orthopedics recommendation and follow-up orthopedics as outpatient.   Hypothyroidism -Continue Synthroid 50 mcg p.o. daily   Acute kidney injury -Resolved   Chronic pain -Continue baclofen, methadone -Continue as needed oxycodone   Hypokalemia -Replete          Consultants: Neurology, orthopedics Procedures performed: Echocardiogram Disposition: Home Diet recommendation:  Discharge Diet  Orders (From admission, onward)     Start     Ordered   12/30/22 0000  Diet - low sodium heart  healthy        12/30/22 1526           Regular diet DISCHARGE MEDICATION: Allergies as of 12/30/2022       Reactions   Amoxicillin    Severe yeast infection causing lesions and rawness   Celery (apium Graveolens Var. Dulce) Skin Test    Numbness and tingling, bitter taste and prickling on the tongue   Flagyl [metronidazole] Itching, Swelling   Morphine Nausea And Vomiting   Nsaids Nausea Only   Headaches   Other    Metals: Nickel, cat dander, dog dander, hickery, ash, walnut, lambs quarter, dust, ragweed, dustmites, difar, sawdust, tar   Finasteride Itching, Swelling, Rash   redness   Sulfonamide Derivatives Itching, Rash   Blisters in throat         Medication List     STOP taking these medications    estrogens (conjugated) 0.45 MG tablet Commonly known as: PREMARIN       TAKE these medications    acetaminophen 650 MG CR tablet Commonly known as: TYLENOL Take 1,300 mg by mouth every 8 (eight) hours as needed for pain.   acyclovir 400 MG tablet Commonly known as: ZOVIRAX Take 400 mg by mouth 2 (two) times daily.   albuterol 108 (90 Base) MCG/ACT inhaler Commonly known as: VENTOLIN HFA Inhale 2 puffs into the lungs every 6 (six) hours as needed for shortness of breath.   baclofen 20 MG tablet Commonly known as: LIORESAL Take 20 mg by mouth 3 (three) times daily.   Cod Liver Oil Caps Take 1 capsule by mouth daily.   COLON-AID PO Take 1 tablet by mouth in the morning and at bedtime.   docusate sodium 100 MG capsule Commonly known as: COLACE Take 100 mg by mouth 2 (two) times daily.   Eliquis 5 MG Tabs tablet Generic drug: apixaban Take 5-10 mg by mouth as directed. Take 2 tablets (10 mg) BID for 7 Days Then Take 1 tablet (5 mg) BID   EPINEPHrine 0.3 mg/0.3 mL Devi Commonly known as: EPI-PEN Inject 0.3 mg into the muscle once as needed (anaphylaxis).   fexofenadine 180 MG tablet Commonly known as: ALLEGRA Take 180 mg by mouth daily.    glucose 4 GM chewable tablet Chew 1 tablet by mouth as needed for low blood sugar.   ipratropium-albuterol 0.5-2.5 (3) MG/3ML Soln Commonly known as: DUONEB Take 3 mLs by nebulization every 4 (four) hours as needed (asthma).   levothyroxine 50 MCG tablet Commonly known as: SYNTHROID Take 50 mcg by mouth daily before breakfast.   methadone 10 MG tablet Commonly known as: DOLOPHINE Take 10 mg by mouth in the morning and at bedtime.   methocarbamol 500 MG tablet Commonly known as: ROBAXIN Take 1 tablet (500 mg total) by mouth every 6 (six) hours as needed for muscle spasms.   nystatin powder Commonly known as: MYCOSTATIN/NYSTOP APPLY TO THE LEFT INGUINAL/GROIN AREA BY TOPICAL ROUTE 2 TIMES PER DAY   omeprazole 40 MG capsule Commonly known as: PRILOSEC Take 40 mg by mouth daily as needed (acid reflux).   oxyCODONE 5 MG immediate release tablet Commonly known as: Oxy IR/ROXICODONE Take 1-2 tablets (5-10 mg total) by mouth every 6 (six) hours as needed (breakthrough pain not responding to chronic methadone).   potassium chloride 10 MEQ tablet Commonly known as: KLOR-CON  M Take 20 mEq by mouth 2 (two) times daily.   PROBIOTIC PO Take 1 capsule by mouth 3 (three) times daily. Bio X 4   sennosides-docusate sodium 8.6-50 MG tablet Commonly known as: SENOKOT-S Take 2 tablets by mouth in the morning and at bedtime.   triamcinolone 0.147 MG/GM topical spray Commonly known as: KENALOG Apply 2-3 sprays topically 3 (three) times a week.   Vitamin D3 50 MCG (2000 UT) capsule Take 2,000 Units by mouth daily.               Discharge Care Instructions  (From admission, onward)           Start     Ordered   12/30/22 0000  Discharge wound care:       Comments: Daily dressing change:   12/30/22 1526            Follow-up Information     Rogers Blocker, MD Follow up in 1 week(s).   Specialty: Internal Medicine Contact information: [address] [place] [address] [phone]                Discharge Exam: Filed Weights   12/25/22 1145 2023/01/02 0453  Weight: 116.1 kg 127.7 kg   General-appears in no acute distress Heart-S1-S2, regular, no murmur auscultated Lungs-clear to auscultation bilaterally, no wheezing or crackles auscultated Abdomen-soft, nontender, no organomegaly Extremities-no edema in the lower extremities Neuro-alert, oriented x3, no focal deficit noted  Condition at discharge: good  The results of significant diagnostics from this hospitalization (including imaging, microbiology, ancillary and laboratory) are listed below for reference.   Imaging Studies: EEG adult  Result Date: 2023/01/02 Lora Havens, MD     January 02, 2023  8:42 AM Patient Name: BRISELDA NAVAL MRN: [id number] Epilepsy Attending: Lora Havens Referring Physician/Provider: Kerney Elbe, DO Date: January 02, 2023 Duration: 25 mins Patient history: 62 year old female with seizure-like activity.  EEG to evaluate for seizure. Level of alertness: Awake, asleep AEDs during EEG study: None Technical aspects: This EEG study was done with scalp electrodes positioned according to the 10-20 International system of electrode placement. Electrical activity was reviewed with band pass filter of 1-'70Hz'$ , sensitivity of 7 uV/mm, display speed of 35m/sec with a '60Hz'$  notched filter applied as appropriate. EEG data were recorded continuously and digitally stored.  Video monitoring was available and reviewed as appropriate. Description: The posterior dominant rhythm consists of 8-9 Hz activity of moderate voltage (25-35 uV) seen predominantly in posterior head regions, symmetric and reactive to eye opening and eye closing. Sleep was characterized by vertex waves, sleep spindles (12 to 14 Hz), maximal frontocentral region.  Hyperventilation and photic stimulation were not performed.   IMPRESSION: This study is within normal limits. No seizures or epileptiform  discharges were seen throughout the recording. A normal interictal EEG does not exclude the diagnosis of epilepsy. PLora Havens  CT HIP RIGHT W WO CONTRAST  Result Date: 12/26/2022 CLINICAL DATA:  Right hip pain EXAM: CT OF THE LOWER RIGHT EXTREMITY WITHOUT CONTRAST TECHNIQUE: Multidetector CT imaging of the lower right extremity was performed following the standard protocol before and during bolus administration of intravenous contrast. RADIATION DOSE REDUCTION: This exam was performed according to the departmental dose-optimization program which includes automated exposure control, adjustment of the mA and/or kV according to patient size and/or use of iterative reconstruction technique. CONTRAST:  1033mOMNIPAQUE IOHEXOL 300 MG/ML  SOLN COMPARISON:  Intraoperative fluoroscopic images from 10/27/2022 FINDINGS: Bones/Joint/Cartilage Postsurgical  changes from right total hip arthroplasty. Acquired protrusio deformity with superomedial migration of the acetabular component with thinning of the medial acetabular wall. No cortical breakthrough. The femoral head remains centered within the acetabular cup. No dislocation. No periprosthetic fracture. No evidence of loosening of the femoral component. There is fluid tracking along the incision site at the anterolateral aspect of the right hip. Otherwise, no appreciable periprosthetic fluid collection. Bony pelvis intact without fracture or diastasis. Moderate degenerative changes of the left hip joint. Lower lumbar spondylosis. No lytic or sclerotic bone lesion. Ligaments Suboptimally assessed by CT. Muscles and Tendons No acute musculotendinous abnormality by CT. Soft tissues Soft tissue edema at the lateral aspect of the hip. No inguinal lymphadenopathy. No acute abnormality is seen within the pelvis. IMPRESSION: 1. Postsurgical changes from right total hip arthroplasty with acquired protrusion deformity with superomedial migration of the acetabular component  with thinning of the medial acetabular wall. No cortical breakthrough. No periprosthetic fracture or evidence of loosening of the femoral component. 2. Fluid tracking along the incision site at the anterolateral aspect of the right hip. Otherwise, no appreciable periprosthetic fluid collection. 3. Moderate degenerative changes of the left hip joint. Electronically Signed   By: Davina Poke D.O.   On: 12/26/2022 17:11   DG HIP UNILAT W OR W/O PELVIS 2-3 VIEWS RIGHT  Result Date: 12/26/2022 CLINICAL DATA:  Hip replacement, infection suspected 269010 Wound dehiscence 269010 EXAM: DG HIP (WITH OR WITHOUT PELVIS) 2-3V RIGHT COMPARISON:  10/27/2022 FINDINGS: Changes of right hip replacement. No hardware or bony complicating feature. No change since intraoperative imaging. No fracture, subluxation or dislocation. IMPRESSION: Right hip replacement.  No visible complicating feature. Electronically Signed   By: Rolm Baptise M.D.   On: 12/26/2022 17:08   ECHOCARDIOGRAM COMPLETE  Result Date: 12/26/2022    ECHOCARDIOGRAM REPORT   Patient Name:   Crystal [name] Date of Exam: 12/26/2022 Medical Rec #:  [id number]      Height:       68.0 in Accession #:    [id number]     Weight:       256.0 lb Date of Birth:  1961-06-22     BSA:          2.270 m Patient Age:    31 years       BP:           144/77 mmHg Patient Gender: F              HR:           90 bpm. Exam Location:  Inpatient Procedure: 2D Echo, Cardiac Doppler, Color Doppler and Intracardiac            Opacification Agent Indications:    I26.02 Pulmonary embolus  History:        Patient has prior history of Echocardiogram examinations, most                 recent 09/30/2008. Abnormal ECG, Arrythmias:Tachycardia,                 Signs/Symptoms:Dyspnea, Shortness of Breath and Chest Pain; Risk                 Factors:Diabetes and Hypertension.  Sonographer:    Roseanna Rainbow RDCS Referring Phys: [id number] [facility] LATIF [facility]  Sonographer Comments: Technically difficult study  due to poor echo windows, suboptimal apical window, suboptimal subcostal window and patient is obese. Image acquisition challenging due to patient body habitus. Extremely  difficult apicals due to habitus and high fowler's position. IMPRESSIONS  1. Right heart not well visualized but appears to be normal in size with preserved function.  2. Left ventricular ejection fraction, by estimation, is 70 to 75%. The left ventricle has hyperdynamic function. The left ventricle has no regional wall motion abnormalities. Left ventricular diastolic parameters were normal.  3. Right ventricular systolic function is normal. The right ventricular size is normal.  4. The mitral valve is normal in structure. No evidence of mitral valve regurgitation. No evidence of mitral stenosis.  5. The aortic valve is tricuspid. Aortic valve regurgitation is not visualized. No aortic stenosis is present.  6. The inferior vena cava is dilated in size with >50% respiratory variability, suggesting right atrial pressure of 8 mmHg. FINDINGS  Left Ventricle: Left ventricular ejection fraction, by estimation, is 70 to 75%. The left ventricle has hyperdynamic function. The left ventricle has no regional wall motion abnormalities. Definity contrast agent was given IV to delineate the left ventricular endocardial borders. The left ventricular internal cavity size was normal in size. There is no left ventricular hypertrophy. Left ventricular diastolic parameters were normal. Right Ventricle: The right ventricular size is normal. Right ventricular systolic function is normal. Left Atrium: Left atrial size was normal in size. Right Atrium: Right atrial size was normal in size. Pericardium: There is no evidence of pericardial effusion. Mitral Valve: The mitral valve is normal in structure. No evidence of mitral valve regurgitation. No evidence of mitral valve stenosis. Tricuspid Valve: The tricuspid valve is normal in structure. Tricuspid valve regurgitation  is trivial. No evidence of tricuspid stenosis. Aortic Valve: The aortic valve is tricuspid. Aortic valve regurgitation is not visualized. No aortic stenosis is present. Pulmonic Valve: The pulmonic valve was normal in structure. Pulmonic valve regurgitation is not visualized. No evidence of pulmonic stenosis. Aorta: The aortic root is normal in size and structure. Venous: The inferior vena cava is dilated in size with greater than 50% respiratory variability, suggesting right atrial pressure of 8 mmHg. IAS/Shunts: No atrial level shunt detected by color flow Doppler. Additional Comments: Right heart not well visualized but appears to be normal in size with preserved function.  LEFT VENTRICLE PLAX 2D LVIDd:         4.10 cm     Diastology LVIDs:         2.20 cm     LV e' medial:    7.51 cm/s LV PW:         1.20 cm     LV E/e' medial:  8.0 LV IVS:        0.90 cm     LV e' lateral:   11.35 cm/s LVOT diam:     2.30 cm     LV E/e' lateral: 5.3 LV SV:         82 LV SV Index:   36 LVOT Area:     4.15 cm  LV Volumes (MOD) LV vol d, MOD A2C: 63.6 ml LV vol d, MOD A4C: 72.2 ml LV vol s, MOD A2C: 29.1 ml LV vol s, MOD A4C: 23.9 ml LV SV MOD A2C:     34.5 ml LV SV MOD A4C:     72.2 ml LV SV MOD BP:      41.9 ml RIGHT VENTRICLE             IVC RV S prime:     14.80 cm/s  IVC diam: 2.30 cm TAPSE (M-mode): 1.8 cm LEFT  ATRIUM             Index        RIGHT ATRIUM           Index LA diam:        2.90 cm 1.28 cm/m   RA Area:     12.30 cm LA Vol (A2C):   42.2 ml 18.59 ml/m  RA Volume:   25.80 ml  11.37 ml/m LA Vol (A4C):   20.9 ml 9.21 ml/m LA Biplane Vol: 29.7 ml 13.09 ml/m  AORTIC VALVE LVOT Vmax:   104.00 cm/s LVOT Vmean:  63.800 cm/s LVOT VTI:    0.197 m  AORTA Ao Root diam: 3.00 cm Ao Asc diam:  3.30 cm MITRAL VALVE MV Area (PHT): 3.77 cm    SHUNTS MV Decel Time: 201 msec    Systemic VTI:  0.20 m MV E velocity: 60.10 cm/s  Systemic Diam: 2.30 cm MV A velocity: 61.10 cm/s MV E/A ratio:  0.98 MV A Prime:    7.7 cm/s Kirk Ruths MD Electronically signed by Kirk Ruths MD Signature Date/Time: 12/26/2022/10:28:28 AM    Final    DG CHEST PORT 1 VIEW  Result Date: 12/26/2022 CLINICAL DATA:  Shortness of breath, dyspnea. EXAM: PORTABLE CHEST 1 VIEW COMPARISON:  Chest x-ray dated 06/26/2019. FINDINGS: Heart size and mediastinal contours are stable. Lungs are clear. No pleural effusion or pneumothorax is seen. Osseous structures about the chest are unremarkable. IMPRESSION: No active disease. No evidence of pneumonia or pulmonary edema. Electronically Signed   By: Franki Cabot M.D.   On: 12/26/2022 07:47   CT Angio Chest PE W and/or Wo Contrast  Addendum Date: 12/25/2022   ADDENDUM REPORT: 12/25/2022 14:31 ADDENDUM: Critical Value/emergent results were discussed by telephone on 12/25/2022 at 2:28 pm with provider CELESTE BEATTY , who verbally acknowledged these results. Electronically Signed   By: Davina Poke D.O.   On: 12/25/2022 14:31   Result Date: 12/25/2022 CLINICAL DATA:  Pulmonary embolism (PE) suspected, high prob EXAM: CT ANGIOGRAPHY CHEST WITH CONTRAST TECHNIQUE: Multidetector CT imaging of the chest was performed using the standard protocol during bolus administration of intravenous contrast. Multiplanar CT image reconstructions and MIPs were obtained to evaluate the vascular anatomy. RADIATION DOSE REDUCTION: This exam was performed according to the departmental dose-optimization program which includes automated exposure control, adjustment of the mA and/or kV according to patient size and/or use of iterative reconstruction technique. CONTRAST:  18m OMNIPAQUE IOHEXOL 300 MG/ML  SOLN COMPARISON:  01/02/2019 FINDINGS: Cardiovascular: Satisfactory opacification of the pulmonary arteries. Pulmonary arterial filling defects bilaterally including involvement of the lobar and segmental branch pulmonary arteries of the right lower lobe and right middle lobe as well as segmental branch pulmonary arteries of the left  lower lobe. No filling defects within the upper lobes. No saddle embolism. RV to LV ratio of 1.09 with flattening of the interventricular septum. Heart size is normal. No pericardial effusion. Thoracic aorta is nonaneurysmal. Mediastinum/Nodes: No enlarged mediastinal, hilar, or axillary lymph nodes. Thyroid gland, trachea, and esophagus demonstrate no significant findings. Lungs/Pleura: No evidence of pulmonary infarction. No airspace consolidation. No pleural effusion or pneumothorax. Upper Abdomen: Adenomatous thickening of the left adrenal gland is unchanged and does not require follow-up imaging. No acute findings within the included upper abdomen. Musculoskeletal: Exaggerated thoracic kyphosis with findings of diffuse idiopathic skeletal hyperostosis. No acute bony findings. No chest wall abnormality. Review of the MIP images confirms the above findings. IMPRESSION: Positive for acute bilateral pulmonary  emboli with CT evidence of right heart strain (RV/LV Ratio = 1.09 ) consistent with at least submassive (intermediate risk) PE. The presence of right heart strain has been associated with an increased risk of morbidity and mortality. Please refer to the "Code PE Focused" order set in EPIC. Ordering provider has been paged. Documentation of the communication of the above findings will be added to the report in the form of an addendum. Electronically Signed: By: Davina Poke D.O. On: 12/25/2022 14:19   US Venous Img Lower Bilateral (DVT)  Result Date: 12/24/2022 CLINICAL DATA:  Bilateral lower extremity edema. Right hip surgery in November. EXAM: BILATERAL LOWER EXTREMITY VENOUS DOPPLER ULTRASOUND TECHNIQUE: Gray-scale sonography with graded compression, as well as color Doppler and duplex ultrasound were performed to evaluate the lower extremity deep venous systems from the level of the common femoral vein and including the common femoral, femoral, profunda femoral, popliteal and calf veins including  the posterior tibial, peroneal and gastrocnemius veins when visible. The superficial great saphenous vein was also interrogated. Spectral Doppler was utilized to evaluate flow at rest and with distal augmentation maneuvers in the common femoral, femoral and popliteal veins. COMPARISON:  None Available. FINDINGS: RIGHT LOWER EXTREMITY Common Femoral Vein: Nonocclusive mural thrombus present which is echogenic and has the appearance of chronic mural thrombus. Saphenofemoral Junction: No evidence of thrombus. Normal compressibility and flow on color Doppler imaging. Profunda Femoral Vein: No evidence of thrombus. Normal compressibility and flow on color Doppler imaging. Femoral Vein: Thrombus present in the right femoral vein which appears to be largely occlusive and likely more acute in appearance compared to the common femoral vein thrombus. Popliteal Vein: The popliteal vein is very poorly visualized and not adequately assessed. Calf Veins: Probable thrombus in the right posterior tibial vein. The peroneal vein is not visualized. Superficial Great Saphenous Vein: No evidence of thrombus. Normal compressibility. Venous Reflux:  None. Other Findings: No evidence of superficial thrombophlebitis or abnormal fluid collection. LEFT LOWER EXTREMITY Common Femoral Vein: No evidence of thrombus. Normal compressibility, respiratory phasicity and response to augmentation. Saphenofemoral Junction: No evidence of thrombus. Normal compressibility and flow on color Doppler imaging. Profunda Femoral Vein: No evidence of thrombus. Normal compressibility and flow on color Doppler imaging. Femoral Vein: No evidence of thrombus. Normal compressibility, respiratory phasicity and response to augmentation. Popliteal Vein: No evidence of thrombus. Normal compressibility, respiratory phasicity and response to augmentation. Calf Veins: No evidence of thrombus. Normal compressibility and flow on color Doppler imaging. Superficial Great  Saphenous Vein: The visualized great saphenous vein demonstrates thrombophlebitis up to nearly the level of the saphenofemoral junction. Venous Reflux:  None. Other Findings:  No abnormal fluid collections. IMPRESSION: 1. Acute occlusive DVT in the right femoral vein and probable acute occlusive thrombus in the right posterior tibial vein. 2. 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 3. Superficial thrombophlebitis of the left great saphenous vein. 4. Poor visualization of the right popliteal vein and nonvisualization of the right peroneal vein. Electronically Signed   By: Aletta Edouard M.D.   On: 12/24/2022 17:00    Microbiology: Results for orders placed or performed during the hospital encounter of 12/25/22  Culture, blood (Routine X 2) w Reflex to ID Panel     Status: None (Preliminary result)   Collection Time: 12/26/22  2:43 PM   Specimen: BLOOD LEFT ARM  Result Value Ref Range Status   Specimen Description   Final    BLOOD LEFT ARM BOTTLES DRAWN AEROBIC  AND ANAEROBIC Performed at [facility], [place] [address], [place], [place] [address]    Special Requests   Final    Blood Culture adequate volume Performed at [place] [address] [place], [place] [address]    Culture   Final    NO GROWTH 3 DAYS Performed at [facility], [place] [address], [place], [place] [address]    Report Status PENDING  Incomplete  Culture, blood (Routine X 2) w Reflex to ID Panel     Status: None (Preliminary result)   Collection Time: 12/26/22  2:43 PM   Specimen: BLOOD LEFT HAND  Result Value Ref Range Status   Specimen Description   Final    BLOOD LEFT HAND BOTTLES DRAWN AEROBIC ONLY Performed at [place] [address], [place], [place] [address]    Special Requests   Final    Blood Culture adequate volume Performed at [place] [address] [place], [place]  [address]    Culture   Final    NO GROWTH 3 DAYS Performed at [facility], [place] [address] [place], [place] [address]    Report Status PENDING  Incomplete    Labs: CBC: Recent Labs  Lab 12/25/22 1238 12/26/22 0721 12/27/22 1017 12/28/22 1045 12/29/22 0624  WBC 7.4 7.9 8.8 7.6 8.1  NEUTROABS  --  4.1 4.6 4.2 3.1  HGB 13.2 12.4 12.8 12.8 11.3*  HCT 42.5 42.2 43.4 42.4 36.9  MCV 80.0 82.1 83.1 82.5 80.7  PLT 307 272 293 282 073   Basic Metabolic Panel: Recent Labs  Lab 12/25/22 1832 12/26/22 0721 12/27/22 1017 12/28/22 1045 12/29/22 0624 12/30/22 0600  NA  --  138 140 139 138 138  K  --  3.8 3.5 3.7 3.4* 4.0  CL  --  100 103 103 101 100  CO2  --  '30 28 30 '$ 32 29  GLUCOSE  --  98 91 118* 98 115*  BUN  --  7* '8 9 9 11  '$ CREATININE  --  0.84 1.19* 1.03* 0.88 0.82  CALCIUM  --  9.1 8.8* 8.9 8.5* 9.1  MG 2.1 1.8 2.0 1.8 2.0  --   PHOS 3.7 4.0 2.6 2.2* 4.4  --    Liver Function Tests: Recent Labs  Lab 12/26/22 0721 12/27/22 1017 12/28/22 1045 12/29/22 0624  AST '16 17 19 '$ 14*  ALT '10 10 10 9  '$ ALKPHOS 95 95 101 76  BILITOT 0.4 0.3 0.2* 0.6  PROT 7.5 7.6 7.9 6.3*  ALBUMIN 3.5 3.6 3.7 3.6   CBG: Recent Labs  Lab 12/26/22 0713 12/27/22 0713 12/28/22 0751 12/29/22 0752 12/30/22 0734  GLUCAP 91 140* 117* 99 123*    Discharge time spent: greater than 30 minutes.  Signed: Oswald Hillock, MD [facility] 12/30/2022

## 2023-01-01 LAB — CULTURE, BLOOD (ROUTINE X 2)
Culture: NO GROWTH
Culture: NO GROWTH
Special Requests: ADEQUATE
Special Requests: ADEQUATE

## 2023-01-07 ENCOUNTER — Other Ambulatory Visit (HOSPITAL_COMMUNITY): Payer: Self-pay

## 2023-01-07 ENCOUNTER — Ambulatory Visit (HOSPITAL_COMMUNITY)
Admission: RE | Admit: 2023-01-07 | Discharge: 2023-01-07 | Disposition: A | Discharge status: Home / Self Care | Payer: Medicare Other | Source: Ambulatory Visit | Attending: Vascular Surgery | Admitting: Vascular Surgery

## 2023-01-07 VITALS — BP 123/61 | HR 83

## 2023-01-07 DIAGNOSIS — I2699 Other pulmonary embolism without acute cor pulmonale: Secondary | ICD-10-CM | POA: Diagnosis present

## 2023-01-07 DIAGNOSIS — I82411 Acute embolism and thrombosis of right femoral vein: Secondary | ICD-10-CM | POA: Insufficient documentation

## 2023-01-07 MED ORDER — APIXABAN 5 MG PO TABS: 5.0000 mg | ORAL_TABLET | Freq: Two times a day (BID) | ORAL | 1 refills | Status: AC

## 2023-01-07 NOTE — Progress Notes (Signed)
DVT Clinic Note  Name: Crystal [name]     MRN: [id number]     DOB: 04/17/1961     Sex: female  PCP: Rogers Blocker, MD  Today's Visit: Visit Information: Initial Visit  Referred to DVT Clinic by: Dr. Langston Masker (Emergency Medicine)  Referred to CPP by: Dr. Carlis Abbott Reason for referral:  Chief Complaint  Patient presents with   Med Management - DVT   HISTORY OF PRESENT ILLNESS:  Crystal [name] is a 62 y.o. female s/p R THA who presents for follow up medication management after diagnosis of DVT on 12/24/22. She started having pain in her R inguinal region and swelling of her R leg 2 weeks after her hip replacement on 10/27/22. She was given a prescription for Xarelto 10 mg to take for 21 days s/p THA which the patient was adherent to and developed the pain and swelling while taking it. 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 Her PCP sent in a prescription for Eliquis and had her present to the ED for further evaluation. Her ED provider Dr. Langston Masker consulted with Dr. Carlis Abbott of vascular surgery who agreed with plan for DOAC and outpatient follow up in the DVT Clinic. The patient presented back to the ED 12/25/22 with new chest pain, SOB, and dizziness and was found to have bilateral PE with marginal right heart strain on CT. PCCM was consulted who reviewed her imaging and classified PE as intermediate low risk and recommended admission for observation with continuation of DOAC. She was admitted to [facility] 7/00/17-4/94/49 which was complicated by seizure-like activity however no seizures were seen on EEG and neurology did not recommend AED. Follow up echo showed no evidence of R heart systolic dysfunction. Premarin (started April 2023) was discontinued at discharge.   Today patient  expresses frustration regarding her care s/p THA and VTE diagnosis. Reports she complained of swelling and pain when symptoms started 2 weeks after THA but did not have imaging done until 6 weeks later. Denies abnormal bleeding or bruising. Denies missed doses of Eliquis. Reports wearing compression stockings but is not wearing them today. Reports she sits most of the day and sleeps in a chair due to pain in both hips. This is unchanged from prior to THA. Is unable to lay flat and elevate her legs. Endorses chest pain with deep breaths, dizziness upon standing, SOB with exertion (getting up out of chair and moving around house). She is not visibly SOB today. Her swelling initially improved but is still having swelling and tenderness in both legs. She saw her PCP yesterday who encouraged her that it can take time for her symptoms to fully improve after initiation of anticoagulation. Reports she sees her orthopedic surgeon again on 01/19/23 and needs to schedule an appt with her OBGYN given estrogen discontinuation.   Positive Thrombotic Risk Factors: Recent surgery (within 3 months), Recent admission to hospital with acute illness (within 3 months), Bed rest >72 hours within 3 month, Estrogen therapy, Obesity, Older Age Bleeding Risk Factors: Anticoagulant therapy  Negative Thrombotic Risk Factors: Previous VTE, Recent trauma (within 3 months), Pregnancy, Paralysis, paresis, or recent plaster cast immobilization of lower extremity, Within 6 weeks postpartum, Recent cesarean section (within 3 months), Sedentary journey lasting >8 hours within 4  weeks, Active cancer, Erythropoiesis-stimulating agent, Testosterone therapy, Smoking, Known thrombophilic condition, Non-malignant, chronic inflammatory condition  Rx Insurance Coverage: Commercial Rx Affordability: Eliquis is $35 per 30 day supply. I provided her with a copay card to bring the cost to $10/month.  Preferred Pharmacy: [facility] [place]    Past Medical History:  Diagnosis Date   Asthma    Activity induced; improved , reports 08-06-2019 breathing now stable per patient report    Chronic fatigue syndrome    Chronic myofascial pain 1997   Dr Jason Fila   Complication of anesthesia    pseudocolonesterase deficiency   DDD (degenerative disc disease), lumbar [phone]   Dr Jenny Reichmann Rendall   Diabetes mellitus    type 2 , checks daily before breakfast     Endometriosis    Fibromyalgia 1997   Dr Jason Fila   Hypothyroidism    IBS (irritable bowel syndrome)    Interstitial cystitis    Dr Alona Bene   Morbid obesity [facility])    Osteoarthritis of lumbar spine 2018   Dr Wandra Feinstein   Peripheral neuropathy    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[place])    hadnt had sx of this in over a year    Reflex sympathetic dystrophy of right lower extremity 2002   Right toe- Dr Jason Fila;  "hasnt bothered me in a few years"    Seizures ([employer])    last seizure was about 8 months ago, unsure what triggered it , say she was hospitalized at [facility] but encounter not seen in epic , does not take any seizure medication at this time , denies reoccurrence of seizure episodes since that time    Sleep apnea    no device at this time   Wears eyeglasses    Wheelchair dependent     Past Surgical History:  Procedure Laterality Date   [place]- Dr Kendall Flack   ABDOMINAL SURGERY     BLADDER SURGERY  1997   Bladder distention, [place] [facility] (1998, 2003, 2004, 2006, 2007, 2008, 2009, 2012, 2013)( [facility]-1999, 2001, 2002) [facility] (2015, 2017,)   BUNIONECTOMY Right 2001   [facility]- Dr Melrose Nakayama   CARDIAC CATHETERIZATION  "some year ago i cant remember"    reports she was having chest pain at the time but says cath was clean     CHOLECYSTECTOMY  1987   [facility], Dr Werner Lean   COLONOSCOPY WITH PROPOFOL N/A 08/07/2019   Procedure: COLONOSCOPY WITH PROPOFOL;  Surgeon: Wilford Corner, MD;  Location: [facility];  Service: Endoscopy;   Laterality: N/A;   GYNECOLOGIC CRYOSURGERY  1983   Dr Oleh Genin office   JOINT REPLACEMENT     denies    KNEE ARTHROSCOPY Left 1991   [facility]- Dr Oretha Caprice   KNEE SURGERY     denies    POLYPECTOMY  08/07/2019   Procedure: POLYPECTOMY;  Surgeon: Wilford Corner, MD;  Location: [facility];  Service: Endoscopy;;   PUBOVAGINAL [place] Right 10/27/2022   Procedure: TOTAL HIP ARTHROPLASTY ANTERIOR APPROACH;  Surgeon: [name], MD;  Location: [facility] ORS;  Service: Orthopedics;  Laterality: Right;   TUBAL LIGATION  1990   [facility]- Dr Kendall Flack    Social History   Socioeconomic History   Marital status: Married    Spouse name: Not on file   Number of children: Not on file   Years of  education: Not on file   Highest education level: Not on file  Occupational History   Not on file  Tobacco Use   Smoking status: Never   Smokeless tobacco: Never  Vaping Use   Vaping Use: Never used  Substance and Sexual Activity   Alcohol use: No   Drug use: Never   Sexual activity: Yes    Birth control/protection: Surgical  Other Topics Concern   Not on file  Social History Narrative   Lives   Caffeine use:    Right handed    Social Determinants of Health   Financial Resource Strain: Not on file  Food Insecurity: No Food Insecurity (12/25/2022)   Hunger Vital Sign    Worried About Running Out of Food in the Last Year: Never true    Ran Out of Food in the Last Year: Never true  Transportation Needs: No Transportation Needs (12/25/2022)   PRAPARE - Hydrologist (Medical): No    Lack of Transportation (Non-Medical): No  Physical Activity: Not on file  Stress: Not on file  Social Connections: Not on file  Intimate Partner Violence: Not At Risk (12/25/2022)   Humiliation, Afraid, Rape, and Kick questionnaire    Fear of Current or Ex-Partner: No    Emotionally Abused: No    Physically  Abused: No    Sexually Abused: No    No family history on file.  Allergies as of 01/07/2023 - Review Complete 01/07/2023  Allergen Reaction Noted   Amoxicillin  10/20/2022   Celery (apium graveolens var. dulce) skin test  10/20/2022   Flagyl [metronidazole] Itching and Swelling 10/17/2017   Morphine Nausea And Vomiting    Nsaids Nausea Only    Other  10/17/2017   Finasteride Itching, Swelling, and Rash 10/20/2022   Sulfonamide derivatives Itching and Rash     Current Outpatient Medications on File Prior to Encounter  Medication Sig Dispense Refill   acetaminophen (TYLENOL) 650 MG CR tablet Take 1,300 mg by mouth every 8 (eight) hours as needed for pain.     acyclovir (ZOVIRAX) 400 MG tablet Take 400 mg by mouth 2 (two) times daily.     albuterol (PROVENTIL HFA;VENTOLIN HFA) 108 (90 BASE) MCG/ACT inhaler Inhale 2 puffs into the lungs every 6 (six) hours as needed for shortness of breath.      baclofen (LIORESAL) 20 MG tablet Take 20 mg by mouth 3 (three) times daily.      Cholecalciferol (VITAMIN D3) 50 MCG (2000 UT) capsule Take 2,000 Units by mouth daily.      docusate sodium (COLACE) 100 MG capsule Take 100 mg by mouth daily.     fexofenadine (ALLEGRA) 180 MG tablet Take 180 mg by mouth daily.     glucose 4 GM chewable tablet Chew 1 tablet by mouth as needed for low blood sugar.     ipratropium-albuterol (DUONEB) 0.5-2.5 (3) MG/3ML SOLN Take 3 mLs by nebulization every 4 (four) hours as needed (asthma).      levothyroxine (SYNTHROID, LEVOTHROID) 50 MCG tablet Take 50 mcg by mouth daily before breakfast.      methadone (DOLOPHINE) 10 MG tablet Take 10 mg by mouth in the morning and at bedtime.     Misc Natural Products (COLON-AID PO) Take 1 tablet by mouth 3 (three) times daily.     omeprazole (PRILOSEC) 40 MG capsule Take 40 mg by mouth daily as needed (acid reflux).     potassium chloride (K-DUR,KLOR-CON) 10 MEQ tablet Take  20 mEq by mouth 2 (two) times daily.       sennosides-docusate sodium (SENOKOT-S) 8.6-50 MG tablet Take 2 tablets by mouth daily.     triamcinolone (KENALOG) 0.147 MG/GM topical spray Apply 2-3 sprays topically 3 (three) times a week.     Cod Liver Oil CAPS Take 1 capsule by mouth daily. (Patient not taking: Reported on 01/07/2023)     EPINEPHrine (EPI-PEN) 0.3 mg/0.3 mL DEVI Inject 0.3 mg into the muscle once as needed (anaphylaxis).      methocarbamol (ROBAXIN) 500 MG tablet Take 1 tablet (500 mg total) by mouth every 6 (six) hours as needed for muscle spasms. (Patient not taking: Reported on 12/25/2022) 40 tablet 0   nystatin (MYCOSTATIN/NYSTOP) powder APPLY TO THE LEFT INGUINAL/GROIN AREA BY TOPICAL ROUTE 2 TIMES PER DAY (Patient not taking: Reported on 01/07/2023)     Probiotic Product (PROBIOTIC PO) Take 1 capsule by mouth 3 (three) times daily. Bio X 4 (Patient not taking: Reported on 01/07/2023)     No current facility-administered medications on file prior to encounter.   REVIEW OF SYSTEMS:  Review of Systems  Respiratory:  Positive for shortness of breath.   Cardiovascular:  Positive for chest pain and leg swelling. Negative for palpitations.  Neurological:  Positive for dizziness and tingling.   PHYSICAL EXAMINATION:  Vitals:   01/07/23 1408  BP: 123/61  Pulse: 83  SpO2: 100%   Physical Exam Vitals reviewed.  Cardiovascular:     Rate and Rhythm: Normal rate.  Pulmonary:     Effort: Pulmonary effort is normal.  Musculoskeletal:        General: Swelling (3+ pitting edema bilaterally) present.   Villalta Score for Post-Thrombotic Syndrome: Pain: Moderate Cramps: Absent Heaviness: Moderate Paresthesia: Moderate Pruritus: Moderate Pretibial Edema: Severe Skin Induration: Absent Hyperpigmentation: Absent Redness: Mild Venous Ectasia: Absent Pain on calf compression: Moderate Villalta Preliminary Score: 14 Is venous ulcer present?: No If venous ulcer is present and score is <15, then 15 points total are assigned:  Absent Villalta Total Score: 14  LABS:  CBC     Component Value Date/Time   WBC 8.1 12/29/2022 0624   RBC 4.57 12/29/2022 0624   HGB 11.3 (L) 12/29/2022 0624   HCT 36.9 12/29/2022 0624   PLT 262 12/29/2022 0624   MCV 80.7 12/29/2022 0624   MCH 24.7 (L) 12/29/2022 0624   MCHC 30.6 12/29/2022 0624   RDW 14.1 12/29/2022 0624   LYMPHSABS 3.6 12/29/2022 0624   MONOABS 1.0 12/29/2022 0624   EOSABS 0.3 12/29/2022 0624   BASOSABS 0.1 12/29/2022 0624    Hepatic Function      Component Value Date/Time   PROT 6.3 (L) 12/29/2022 0624   ALBUMIN 3.6 12/29/2022 0624   AST 14 (L) 12/29/2022 0624   ALT 9 12/29/2022 0624   ALKPHOS 76 12/29/2022 0624   BILITOT 0.6 12/29/2022 0624    Renal Function   Lab Results  Component Value Date   CREATININE 0.82 12/30/2022   CREATININE 0.88 12/29/2022   CREATININE 1.03 (H) 12/28/2022    Estimated Creatinine Clearance: 100.4 mL/min (by C-G formula based on SCr of 0.82 mg/dL).   CV Imaging Studies:  12/24/22 US Venous Img Lower Bilateral (DVT) IMPRESSION: 1. Acute occlusive DVT in the right femoral vein and probable acute occlusive thrombus in the right posterior tibial vein. 2. 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  3. Superficial thrombophlebitis of the left great saphenous vein. 4. Poor visualization of the  right popliteal vein and nonvisualization of the right peroneal vein.  12/25/22 CT Angio Chest PE W and/or Wo Contrast FINDINGS: Cardiovascular: Satisfactory opacification of the pulmonary arteries. Pulmonary arterial filling defects bilaterally including involvement of the lobar and segmental branch pulmonary arteries of the right lower lobe and right middle lobe as well as segmental branch pulmonary arteries of the left lower lobe. No filling defects within the upper lobes. No saddle embolism. RV to LV ratio of 1.09 with flattening of the interventricular septum. Heart size is normal. No  pericardial effusion. Thoracic aorta is nonaneurysmal.  IMPRESSION: Positive for acute bilateral pulmonary emboli with CT evidence of right heart strain (RV/LV Ratio = 1.09 ) consistent with at least submassive (intermediate risk) PE.   12/26/22 ECHO COMPLETE WITH IMAGING ENHANCING AGENT  IMPRESSIONS   1. Right heart not well visualized but appears to be normal in size with  preserved function.   2. Left ventricular ejection fraction, by estimation, is 70 to 75%. The  left ventricle has hyperdynamic function. The left ventricle has no  regional wall motion abnormalities. Left ventricular diastolic parameters  were normal.   3. Right ventricular systolic function is normal. The right ventricular  size is normal.   4. The mitral valve is normal in structure. No evidence of mitral valve  regurgitation. No evidence of mitral stenosis.   5. The aortic valve is tricuspid. Aortic valve regurgitation is not  visualized. No aortic stenosis is present.   6. The inferior vena cava is dilated in size with >50% respiratory  variability, suggesting right atrial pressure of 8 mmHg.   ASSESSMENT: Location of DVT: Right femoral vein, Right distal vein Cause of DVT: provoked by a transient risk factor - THA, immobility s/p THA, estrogen therapy  PLAN: -Continue apixaban (Eliquis) 5 mg twice daily. -Expected duration of therapy: 3 months. Therapy started on 12/24/22. -Patient educated on purpose, proper use and potential adverse effects of apixaban (Eliquis). -Discussed importance of taking medication around the same time every day. -Advised patient of medications to avoid (NSAIDs, aspirin doses >100 mg daily). -Educated that Tylenol (acetaminophen) is the preferred analgesic to lower the risk of bleeding. -Advised patient to alert all providers of anticoagulation therapy prior to starting a new medication or having a procedure. -Emphasized importance of monitoring for signs and symptoms of bleeding  (abnormal bruising, prolonged bleeding, nose bleeds, bleeding from gums, discolored urine, black tarry stools). -Educated patient to present to the ED if emergent signs and symptoms of new thrombosis occur. -Counseled patient to wear compression stockings daily, removing at night. -Encouraged patient to start elevating legs and being more consistent with wearing compression stockings to help with swelling. Important to get out of her chair and move throughout the day as able.  -Provided patient with copay card to decrease cost of Eliquis from $35/month to $10/month.   Follow up: 1 month in DVT [place], PharmD, [place], CPP [facility] Thrombosis Clinic Clinical Pharmacist Practitioner Office: [phone]

## 2023-01-07 NOTE — Patient Instructions (Signed)
-  Continue Eliquis 5 mg (1 tablet) twice daily.  -[facility] You will likely need to call the pharmacy to ask them to fill this when you start to run low on your current supply.  -It is important to take your medication around the same time every day.  -Avoid NSAIDs like ibuprofen (Advil, Motrin) and naproxen (Aleve) as well as aspirin doses over 100 mg daily. -Tylenol (acetaminophen) is the preferred over the counter pain medication to lower the risk of bleeding. -Be sure to alert all of your health care providers that you are taking an anticoagulant prior to starting a new medication or having a procedure. -Monitor for signs and symptoms of bleeding (abnormal bruising, prolonged bleeding, nose bleeds, bleeding from gums, discolored urine, black tarry stools). If you have fallen and hit your head OR if your bleeding is severe or not stopping, seek emergency care.  -Go to the emergency room if emergent signs and symptoms of new clot occur (new or worse swelling and pain in an arm or leg, shortness of breath, chest pain, fast or irregular heartbeats, lightheadedness, dizziness, fainting, coughing up blood) or if you experience a significant color change (pale or blue) in the extremity that has the DVT.  -We recommend you wear thigh high compression stockings as long as you are having swelling or pain. Be sure to purchase the correct size and take them off at night.   Your next visit is on February 29 at [facility] [place], [place], [place] [address] Enter the hospital through Entrance C off [facility] and pull up to the [place] entrance to the free [place] parking.  Check in for your appointment at the [facility].   If you have any questions or need to reschedule an appointment, please call [phone].  If you are having an emergency, call 911 or present to the nearest emergency room.   What is a  DVT?  -Deep vein thrombosis (DVT) is a condition in which a blood clot forms in a vein of the deep venous system which can occur in the lower leg, thigh, pelvis, arm, or neck. This condition is serious and can be life-threatening if the clot travels to the arteries of the lungs and causing a blockage (pulmonary embolism, PE). A DVT can also damage veins in the leg, which can lead to long-term venous disease, leg pain, swelling, discoloration, and ulcers or sores (post-thrombotic syndrome).  -Treatment may include taking an anticoagulant medication to prevent more clots from forming and the current clot from growing, wearing compression stockings, and/or surgical procedures to remove or dissolve the clot.

## 2023-01-11 ENCOUNTER — Other Ambulatory Visit: Payer: Self-pay

## 2023-01-11 ENCOUNTER — Encounter: Payer: Self-pay | Admitting: Vascular Surgery

## 2023-01-11 ENCOUNTER — Ambulatory Visit (INDEPENDENT_AMBULATORY_CARE_PROVIDER_SITE_OTHER): Payer: Medicare Other | Admitting: Vascular Surgery

## 2023-01-11 VITALS — BP 149/77 | HR 79 | Temp 98.0°F | Resp 16 | Ht 67.5 in | Wt 256.0 lb

## 2023-01-11 DIAGNOSIS — I82411 Acute embolism and thrombosis of right femoral vein: Secondary | ICD-10-CM | POA: Diagnosis not present

## 2023-01-11 DIAGNOSIS — I82401 Acute embolism and thrombosis of unspecified deep veins of right lower extremity: Secondary | ICD-10-CM

## 2023-01-11 NOTE — Progress Notes (Signed)
Patient name: Crystal [name] MRN: [id number] DOB: 1961-02-28 Sex: female  REASON FOR CONSULT: Right leg DVT  HPI: PERIAN TEDDER is a 62 y.o. female, with hx morbid obesity, HTN, chronic back pain that presents for evaluation of extensive right leg DVT.  She initially had right hip surgery in November on 10/27/22.  She noted swelling in her legs worse on the right several weeks after this surgery.  She then ultimately was diagnosed with a DVT in early January and started on Eliquis.  She has been seen in her DVT clinic and has been continuing Eliquis.  She states her right leg has not gotten any better.  Her symptoms have progressed and now with significant thigh discomfort.  She is in a wheelchair today but states that is only when she goes long distances due to her back.  She states she is normally able to stand and is ambulatory.  Past Medical History:  Diagnosis Date   Asthma    Activity induced; improved , reports 08-06-2019 breathing now stable per patient report    Chronic fatigue syndrome    Chronic myofascial pain 1997   Dr Jason Fila   Complication of anesthesia    pseudocolonesterase deficiency   DDD (degenerative disc disease), lumbar [phone]   Dr Jenny Reichmann Rendall   Diabetes mellitus    type 2 , checks daily before breakfast     Endometriosis    Fibromyalgia 1997   Dr Jason Fila   Hypothyroidism    IBS (irritable bowel syndrome)    Interstitial cystitis    Dr Alona Bene   Morbid obesity [facility])    Osteoarthritis of lumbar spine 2018   Dr Wandra Feinstein   Peripheral neuropathy    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[place])    hadnt had sx of this in over a year    Reflex sympathetic dystrophy of right lower extremity 2002   Right toe- Dr Jason Fila;  "hasnt bothered me in a few years"    Seizures ([place])    last seizure was about 8 months ago, unsure what triggered it , say she was hospitalized at [facility] but encounter not seen in epic , does not take any seizure medication at this time ,  denies reoccurrence of seizure episodes since that time    Sleep apnea    no device at this time   Wears eyeglasses    Wheelchair dependent     Past Surgical History:  Procedure Laterality Date   [place]- Dr Kendall Flack   ABDOMINAL SURGERY     BLADDER SURGERY  1997   Bladder distention, [place] (1998, 2003, 2004, 2006, 2007, 2008, 2009, 2012, 2013)( [facility]-1999, 2001, 2002) [facility] (2015, 2017,)   BUNIONECTOMY Right 2001   [facility]- Dr Melrose Nakayama   CARDIAC CATHETERIZATION  "some year ago i cant remember"    reports she was having chest pain at the time but says cath was clean     CHOLECYSTECTOMY  1987   [facility], Dr Werner Lean   COLONOSCOPY WITH PROPOFOL N/A 08/07/2019   Procedure: COLONOSCOPY WITH PROPOFOL;  Surgeon: Wilford Corner, MD;  Location: [facility]COPY;  Service: Endoscopy;  Laterality: N/A;   GYNECOLOGIC CRYOSURGERY  1983   Dr Oleh Genin office   JOINT REPLACEMENT     denies    KNEE ARTHROSCOPY Left 1991   [facility]- Dr Oretha Caprice   KNEE SURGERY     denies    POLYPECTOMY  08/07/2019  Procedure: POLYPECTOMY;  Surgeon: Wilford Corner, MD;  Location: [facility];  Service: Endoscopy;;   PUBOVAGINAL [place] Right 10/27/2022   Procedure: TOTAL HIP ARTHROPLASTY ANTERIOR APPROACH;  Surgeon: [name], MD;  Location: [facility] ORS;  Service: Orthopedics;  Laterality: Right;   [place]- Dr Kendall Flack    History reviewed. No pertinent family history.  SOCIAL HISTORY: Social History   Socioeconomic History   Marital status: Married    Spouse name: Not on file   Number of children: Not on file   Years of education: Not on file   Highest education level: Not on file  Occupational History   Not on file  Tobacco Use   Smoking status: Never   Smokeless tobacco: Never  Vaping Use   Vaping Use: Never used  Substance and Sexual Activity    Alcohol use: No   Drug use: Never   Sexual activity: Yes    Birth control/protection: Surgical  Other Topics Concern   Not on file  Social History Narrative   Lives   Caffeine use:    Right handed    Social Determinants of Health   Financial Resource Strain: Not on file  Food Insecurity: No Food Insecurity (12/25/2022)   Hunger Vital Sign    Worried About Running Out of Food in the Last Year: Never true    Ran Out of Food in the Last Year: Never true  Transportation Needs: No Transportation Needs (12/25/2022)   PRAPARE - Hydrologist (Medical): No    Lack of Transportation (Non-Medical): No  Physical Activity: Not on file  Stress: Not on file  Social Connections: Not on file  Intimate Partner Violence: Not At Risk (12/25/2022)   Humiliation, Afraid, Rape, and Kick questionnaire    Fear of Current or Ex-Partner: No    Emotionally Abused: No    Physically Abused: No    Sexually Abused: No    Allergies  Allergen Reactions   Amoxicillin     Severe yeast infection causing lesions and rawness   Celery (Apium Graveolens Var. Dulce) Skin Test     Numbness and tingling, bitter taste and prickling on the tongue   Flagyl [Metronidazole] Itching and Swelling   Morphine Nausea And Vomiting   Nsaids Nausea Only    Headaches   Other     Metals: Nickel, cat dander, dog dander, hickery, ash, walnut, lambs quarter, dust, ragweed, dustmites, difar, sawdust, tar   Finasteride Itching, Swelling and Rash    redness   Sulfonamide Derivatives Itching and Rash    Blisters in throat     Current Outpatient Medications  Medication Sig Dispense Refill   acetaminophen (TYLENOL) 650 MG CR tablet Take 1,300 mg by mouth every 8 (eight) hours as needed for pain.     acyclovir (ZOVIRAX) 400 MG tablet Take 400 mg by mouth 2 (two) times daily.     albuterol (PROVENTIL HFA;VENTOLIN HFA) 108 (90 BASE) MCG/ACT inhaler Inhale 2 puffs into the lungs every 6 (six) hours as needed  for shortness of breath.      apixaban (ELIQUIS) 5 MG TABS tablet Take 1 tablet (5 mg total) by mouth 2 (two) times daily. 60 tablet 1   baclofen (LIORESAL) 20 MG tablet Take 20 mg by mouth 3 (three) times daily.      Cholecalciferol (VITAMIN D3) 50 MCG (2000 UT) capsule  Take 2,000 Units by mouth daily.      Cod Liver Oil CAPS Take 1 capsule by mouth daily.     docusate sodium (COLACE) 100 MG capsule Take 100 mg by mouth daily.     EPINEPHrine (EPI-PEN) 0.3 mg/0.3 mL DEVI Inject 0.3 mg into the muscle once as needed (anaphylaxis).      fexofenadine (ALLEGRA) 180 MG tablet Take 180 mg by mouth daily.     glucose 4 GM chewable tablet Chew 1 tablet by mouth as needed for low blood sugar.     ipratropium-albuterol (DUONEB) 0.5-2.5 (3) MG/3ML SOLN Take 3 mLs by nebulization every 4 (four) hours as needed (asthma).      levothyroxine (SYNTHROID, LEVOTHROID) 50 MCG tablet Take 50 mcg by mouth daily before breakfast.      methadone (DOLOPHINE) 10 MG tablet Take 10 mg by mouth in the morning and at bedtime.     methocarbamol (ROBAXIN) 500 MG tablet Take 1 tablet (500 mg total) by mouth every 6 (six) hours as needed for muscle spasms. 40 tablet 0   Misc Natural Products (COLON-AID PO) Take 1 tablet by mouth 3 (three) times daily.     nystatin (MYCOSTATIN/NYSTOP) powder      omeprazole (PRILOSEC) 40 MG capsule Take 40 mg by mouth daily as needed (acid reflux).     potassium chloride (K-DUR,KLOR-CON) 10 MEQ tablet Take 20 mEq by mouth 2 (two) times daily.      Probiotic Product (PROBIOTIC PO) Take 1 capsule by mouth 3 (three) times daily. Bio X 4     sennosides-docusate sodium (SENOKOT-S) 8.6-50 MG tablet Take 2 tablets by mouth daily.     triamcinolone (KENALOG) 0.147 MG/GM topical spray Apply 2-3 sprays topically 3 (three) times a week.     No current facility-administered medications for this visit.    REVIEW OF SYSTEMS:  '[X]'$  denotes positive finding, '[ ]'$  denotes negative finding Cardiac  Comments:   Chest pain or chest pressure:    Shortness of breath upon exertion:    Short of breath when lying flat:    Irregular heart rhythm:        Vascular    Pain in calf, thigh, or hip brought on by ambulation:    Pain in feet at night that wakes you up from your sleep:     Blood clot in your veins:    Leg swelling:  x Right      Pulmonary    Oxygen at home:    Productive cough:     Wheezing:         Neurologic    Sudden weakness in arms or legs:     Sudden numbness in arms or legs:     Sudden onset of difficulty speaking or slurred speech:    Temporary loss of vision in one eye:     Problems with dizziness:         Gastrointestinal    Blood in stool:     Vomited blood:         Genitourinary    Burning when urinating:     Blood in urine:        Psychiatric    Major depression:         Hematologic    Bleeding problems:    Problems with blood clotting too easily:        Skin    Rashes or ulcers:        Constitutional    Fever or  chills:      PHYSICAL EXAM: Vitals:   01/11/23 1411  BP: (!) 149/77  Pulse: 79  Resp: 16  Temp: 98 F (36.7 C)  TempSrc: Temporal  SpO2: 98%  Weight: 256 lb (116.1 kg)  Height: 5' 7.5" (1.715 m)    GENERAL: The patient is a well-nourished female, in no acute distress. The vital signs are documented above. CARDIAC: There is a regular rate and rhythm.  VASCULAR:  Palpable DP pulses bilaterally Notable right leg edema with larger circumference PULMONARY: No respiratory distress. ABDOMEN: Soft and non-tender. MUSCULOSKELETAL: There are no major deformities or cyanosis. NEUROLOGIC: No focal weakness or paresthesias are detected. SKIN: There are no ulcers or rashes noted. PSYCHIATRIC: The patient has a normal affect.  DATA:     EXAM: BILATERAL LOWER EXTREMITY VENOUS DOPPLER ULTRASOUND   TECHNIQUE: Gray-scale sonography with graded compression, as well as color Doppler and duplex ultrasound were performed to evaluate the  lower extremity deep venous systems from the level of the common femoral vein and including the common femoral, femoral, profunda femoral, popliteal and calf veins including the posterior tibial, peroneal and gastrocnemius veins when visible. The superficial great saphenous vein was also interrogated. Spectral Doppler was utilized to evaluate flow at rest and with distal augmentation maneuvers in the common femoral, femoral and popliteal veins.   COMPARISON:  None Available.   FINDINGS: RIGHT LOWER EXTREMITY   Common Femoral Vein: Nonocclusive mural thrombus present which is echogenic and has the appearance of chronic mural thrombus.   Saphenofemoral Junction: No evidence of thrombus. Normal compressibility and flow on color Doppler imaging.   Profunda Femoral Vein: No evidence of thrombus. Normal compressibility and flow on color Doppler imaging.   Femoral Vein: Thrombus present in the right femoral vein which appears to be largely occlusive and likely more acute in appearance compared to the common femoral vein thrombus.   Popliteal Vein: The popliteal vein is very poorly visualized and not adequately assessed.   Calf Veins: Probable thrombus in the right posterior tibial vein. The peroneal vein is not visualized.   Superficial Great Saphenous Vein: No evidence of thrombus. Normal compressibility.   Venous Reflux:  None.   Other Findings: No evidence of superficial thrombophlebitis or abnormal fluid collection.   LEFT LOWER EXTREMITY   Common Femoral Vein: No evidence of thrombus. Normal compressibility, respiratory phasicity and response to augmentation.   Saphenofemoral Junction: No evidence of thrombus. Normal compressibility and flow on color Doppler imaging.   Profunda Femoral Vein: No evidence of thrombus. Normal compressibility and flow on color Doppler imaging.   Femoral Vein: No evidence of thrombus. Normal compressibility, respiratory phasicity and  response to augmentation.   Popliteal Vein: No evidence of thrombus. Normal compressibility, respiratory phasicity and response to augmentation.   Calf Veins: No evidence of thrombus. Normal compressibility and flow on color Doppler imaging.   Superficial Great Saphenous Vein: The visualized great saphenous vein demonstrates thrombophlebitis up to nearly the level of the saphenofemoral junction.   Venous Reflux:  None.   Other Findings:  No abnormal fluid collections.   IMPRESSION: 1. Acute occlusive DVT in the right femoral vein and probable acute occlusive thrombus in the right posterior tibial vein. 2. 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 3. Superficial thrombophlebitis of the left great saphenous vein. 4. Poor visualization of the right popliteal vein and nonvisualization of the right peroneal vein.     Electronically Signed   By: Jenness Corner.D.  On: 12/24/2022 17:00  Assessment/Plan:  62 year old female with with hx morbid obesity, HTN, chronic back that presented with extensive right leg DVT in early January following hip surgery in November.  Duplex earlier this month showed thrombus in the right femoral and popliteal vein with more chronic appearing thrombus in the common femoral vein.  I would have expected her to see symptomatic improvement on anticoagulation alone especially with clot in her common femoral vein being nonocclusive.  Unfortunately she has had progression of symptoms on Eliquis now with significant pain and swelling in her right lower extremity particularly the thigh and groin.  I discussed after review of her duplex we could offer her a venous thrombectomy but this would require her being in the prone position for popliteal approach.  I am somewhat concerned about her ability to lay prone which I discussed with her today.  She wants to try given she feels she cannot tolerate the symptoms in her leg.  Discussed  this being done with moderate sedation in the Cath Lab.  I discussed the clot would be retrieved with percutaneous mechanical thrombectomy to alleviate her symptoms and prevent post thrombotic syndrome.  I discussed if the clot is too organized we may be unable to traverse this with a wire and catheter and then unsuccessful in trying to retrieve the clot.  Also if she cannot lay prone will have to stop which she understands.  Risks benefits discussed including risk of thromboembolism.   Marty Heck, MD [facility] Office: [phone]

## 2023-01-13 ENCOUNTER — Encounter (HOSPITAL_COMMUNITY)
Admission: RE | Disposition: A | Discharge status: Home / Self Care | Payer: Self-pay | Source: Home / Self Care | Attending: Vascular Surgery

## 2023-01-13 ENCOUNTER — Ambulatory Visit (HOSPITAL_COMMUNITY)
Admission: RE | Admit: 2023-01-13 | Discharge: 2023-01-13 | Disposition: A | Discharge status: Home / Self Care | Payer: Medicare Other | Attending: Vascular Surgery | Admitting: Vascular Surgery

## 2023-01-13 ENCOUNTER — Other Ambulatory Visit: Payer: Self-pay

## 2023-01-13 DIAGNOSIS — Z7901 Long term (current) use of anticoagulants: Secondary | ICD-10-CM | POA: Diagnosis not present

## 2023-01-13 DIAGNOSIS — I10 Essential (primary) hypertension: Secondary | ICD-10-CM | POA: Diagnosis not present

## 2023-01-13 DIAGNOSIS — I82401 Acute embolism and thrombosis of unspecified deep veins of right lower extremity: Secondary | ICD-10-CM

## 2023-01-13 DIAGNOSIS — Z539 Procedure and treatment not carried out, unspecified reason: Secondary | ICD-10-CM | POA: Insufficient documentation

## 2023-01-13 DIAGNOSIS — Z6838 Body mass index (BMI) 38.0-38.9, adult: Secondary | ICD-10-CM | POA: Diagnosis not present

## 2023-01-13 DIAGNOSIS — I82511 Chronic embolism and thrombosis of right femoral vein: Secondary | ICD-10-CM | POA: Insufficient documentation

## 2023-01-13 DIAGNOSIS — M549 Dorsalgia, unspecified: Secondary | ICD-10-CM | POA: Insufficient documentation

## 2023-01-13 DIAGNOSIS — G8929 Other chronic pain: Secondary | ICD-10-CM | POA: Diagnosis not present

## 2023-01-13 LAB — POCT I-STAT, CHEM 8
BUN: 9 mg/dL (ref 8–23)
Calcium, Ion: 1.21 mmol/L (ref 1.15–1.40)
Chloride: 98 mmol/L (ref 98–111)
Creatinine, Ser: 0.8 mg/dL (ref 0.44–1.00)
Glucose, Bld: 98 mg/dL (ref 70–99)
HCT: 41 % (ref 36.0–46.0)
Hemoglobin: 13.9 g/dL (ref 12.0–15.0)
Potassium: 4.6 mmol/L (ref 3.5–5.1)
Sodium: 142 mmol/L (ref 135–145)
TCO2: 30 mmol/L (ref 22–32)

## 2023-01-13 LAB — GLUCOSE, CAPILLARY
Glucose-Capillary: 81 mg/dL (ref 70–99)
Glucose-Capillary: 75 mg/dL (ref 70–99)

## 2023-01-13 SURGERY — PERIPHERAL VASCULAR THROMBECTOMY
Anesthesia: LOCAL

## 2023-01-13 MED ORDER — SODIUM CHLORIDE 0.9 % IV SOLN: INTRAVENOUS | Status: DC

## 2023-01-13 NOTE — Progress Notes (Addendum)
Attempted to place patient prone in the Cath Lab for percutaneous thrombectomy of her right leg DVT.  Patient unable to lay prone due to pain from chronic back problems as well as recent right hip surgery.  Ultimately we elected to abort the procedure and patient was in agreement given she could not lay on her stomach.  I discussed anticoagulation with leg elevation and compression using either compression stockings or ace wraps.  We did wrap her right leg today.  I did evaluate her right common femoral vein with her in the supine position in the Cath Lab after we rolled her over.  The right common femoral vein was patent and compressible.  I did not feel accessing this with her in the supine position would be of any significant benefit.  Marty Heck, MD [facility] Office: [place]

## 2023-01-13 NOTE — H&P (Signed)
History and Physical Interval Note:  01/13/2023 11:01 AM  Crystal [name]  has presented today for surgery, with the diagnosis of right leg dvt.  The various methods of treatment have been discussed with the patient and family. After consideration of risks, benefits and other options for treatment, the patient has consented to  Procedure(s): VENOUS VASCULAR THROMBECTOMY (N/A) as a surgical intervention.  The patient's history has been reviewed, patient examined, no change in status, stable for surgery.  I have reviewed the patient's chart and labs.  Questions were answered to the patient's satisfaction.    Attempt venous thrombectomy right leg DVT.  Marty Heck     Patient name: Crystal [name]           MRN: [id number]        DOB: [birth year]        Sex: female   REASON FOR CONSULT: Right leg DVT   HPI: Crystal [name] is a 62 y.o. female, with hx morbid obesity, HTN, chronic back pain that presents for evaluation of extensive right leg DVT.  She initially had right hip surgery in November on 10/27/22.  She noted swelling in her legs worse on the right several weeks after this surgery.  She then ultimately was diagnosed with a DVT in early January and started on Eliquis.  She has been seen in her DVT clinic and has been continuing Eliquis.  She states her right leg has not gotten any better.  Her symptoms have progressed and now with significant thigh discomfort.  She is in a wheelchair today but states that is only when she goes long distances due to her back.  She states she is normally able to stand and is ambulatory.       Past Medical History:  Diagnosis Date   Asthma      Activity induced; improved , reports 08-06-2019 breathing now stable per patient report    Chronic fatigue syndrome     Chronic myofascial pain 1997    Dr Jason Fila   Complication of anesthesia      pseudocolonesterase deficiency   DDD (degenerative disc disease), lumbar [phone]    Dr Jenny Reichmann Rendall   Diabetes  mellitus      type 2 , checks daily before breakfast     Endometriosis     Fibromyalgia 1997    Dr Jason Fila   Hypothyroidism     IBS (irritable bowel syndrome)     Interstitial cystitis      Dr Alona Bene   Morbid obesity [facility])     Osteoarthritis of lumbar spine 2018    Dr Wandra Feinstein   Peripheral neuropathy     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 but encounter not seen in epic , does not take any seizure medication at this time , denies reoccurrence of seizure episodes since that time    Sleep apnea      no device at this time   Wears eyeglasses     Wheelchair dependent             Past Surgical History:  Procedure Laterality Date   ABDOMINAL HYSTERECTOMY   1992    [facility]- Dr Kendall Flack   ABDOMINAL SURGERY       BLADDER SURGERY   1997    Bladder distention, [place] [facility] (1998, 2003, 2004, 2006, 2007, 2008, 2009, 2012, 2013)( [facility]-1999, 2001, 2002) [facility] (2015, 2017,)   BUNIONECTOMY Right 2001    [facility]- Dr Melrose Nakayama   CARDIAC CATHETERIZATION   "some year ago i cant remember"     reports she was having chest pain at the time but says cath was clean     CHOLECYSTECTOMY   1987    [facility], Dr Werner Lean   COLONOSCOPY WITH PROPOFOL N/A 08/07/2019    Procedure: COLONOSCOPY WITH PROPOFOL;  Surgeon: Wilford Corner, MD;  Location: [facility];  Service: Endoscopy;  Laterality: N/A;   GYNECOLOGIC CRYOSURGERY   1983    Dr Oleh Genin office   JOINT REPLACEMENT        denies    KNEE ARTHROSCOPY Left 1991    [facility]- Dr Oretha Caprice   KNEE SURGERY        denies    POLYPECTOMY   08/07/2019    Procedure: POLYPECTOMY;  Surgeon: Wilford Corner, MD;  Location: [facility];  Service: Endoscopy;;    PUBOVAGINAL [place] Right 10/27/2022    Procedure: TOTAL HIP ARTHROPLASTY ANTERIOR APPROACH;  Surgeon: [name], MD;  Location: [facility];  Service: Orthopedics;  Laterality: Right;   [place]- Dr Kendall Flack      History reviewed. No pertinent family history.   SOCIAL HISTORY: Social History         Socioeconomic History   Marital status: Married      Spouse name: Not on file   Number of children: Not on file   Years of education: Not on file   Highest education level: Not on file  Occupational History   Not on file  Tobacco Use   Smoking status: Never   Smokeless tobacco: Never  Vaping Use   Vaping Use: Never used  Substance and Sexual Activity   Alcohol use: No   Drug use: Never   Sexual activity: Yes      Birth control/protection: Surgical  Other Topics Concern   Not on file  Social History Narrative    Lives    Caffeine use:     Right handed     Social Determinants of Health        Financial Resource Strain: Not on file  Food Insecurity: No Food Insecurity (12/25/2022)    Hunger Vital Sign     Worried About Running Out of Food in the Last Year: Never true     Ran Out of Food in the Last Year: Never true  Transportation Needs: No Transportation Needs (12/25/2022)    PRAPARE - Armed forces logistics/support/administrative officer (Medical): No     Lack of Transportation (Non-Medical): No  Physical Activity: Not on file  Stress: Not on file  Social Connections: Not on file  Intimate Partner Violence: Not At Risk (12/25/2022)    Humiliation, Afraid, Rape, and Kick questionnaire     Fear of Current or Ex-Partner: No     Emotionally Abused: No     Physically Abused: No     Sexually Abused: No           Allergies  Allergen Reactions   Amoxicillin        Severe yeast infection causing lesions and rawness   Celery (Apium Graveolens Var. Dulce) Skin Test         Numbness and tingling, bitter taste and prickling on the tongue   Flagyl [Metronidazole] Itching and Swelling   Morphine Nausea And Vomiting   Nsaids Nausea Only      Headaches   Other        Metals: Nickel, cat dander, dog dander, hickery, ash, walnut, lambs quarter, dust, ragweed, dustmites, difar, sawdust, tar   Finasteride Itching, Swelling and Rash      redness   Sulfonamide Derivatives Itching and Rash      Blisters in throat             Current Outpatient Medications  Medication Sig Dispense Refill   acetaminophen (TYLENOL) 650 MG CR tablet Take 1,300 mg by mouth every 8 (eight) hours as needed for pain.       acyclovir (ZOVIRAX) 400 MG tablet Take 400 mg by mouth 2 (two) times daily.       albuterol (PROVENTIL HFA;VENTOLIN HFA) 108 (90 BASE) MCG/ACT inhaler Inhale 2 puffs into the lungs every 6 (six) hours as needed for shortness of breath.        apixaban (ELIQUIS) 5 MG TABS tablet Take 1 tablet (5 mg total) by mouth 2 (two) times daily. 60 tablet 1   baclofen (LIORESAL) 20 MG tablet Take 20 mg by mouth 3 (three) times daily.        Cholecalciferol (VITAMIN D3) 50 MCG (2000 UT) capsule Take 2,000 Units by mouth daily.        Cod Liver Oil CAPS Take 1 capsule by mouth daily.       docusate sodium (COLACE) 100 MG capsule Take 100 mg by mouth daily.       EPINEPHrine (EPI-PEN) 0.3 mg/0.3 mL DEVI Inject 0.3 mg into the muscle once as needed (anaphylaxis).        fexofenadine (ALLEGRA) 180 MG tablet Take 180 mg by mouth daily.       glucose 4 GM chewable tablet Chew 1 tablet by mouth as needed for low blood sugar.       ipratropium-albuterol (DUONEB) 0.5-2.5 (3) MG/3ML SOLN Take 3 mLs by nebulization every 4 (four) hours as needed (asthma).        levothyroxine (SYNTHROID, LEVOTHROID) 50 MCG tablet Take 50 mcg by mouth daily before breakfast.        methadone (DOLOPHINE) 10 MG tablet Take 10 mg by mouth in the morning and at bedtime.       methocarbamol (ROBAXIN) 500 MG tablet Take  1 tablet (500 mg total) by mouth every 6 (six) hours as needed for muscle spasms. 40 tablet 0   Misc Natural Products (COLON-AID PO) Take 1 tablet by mouth 3 (three) times daily.       nystatin (MYCOSTATIN/NYSTOP) powder         omeprazole (PRILOSEC) 40 MG capsule Take 40 mg by mouth daily as needed (acid reflux).       potassium chloride (K-DUR,KLOR-CON) 10 MEQ tablet Take 20 mEq by mouth 2 (two) times daily.        Probiotic Product (PROBIOTIC PO) Take 1 capsule by mouth 3 (three) times daily. Bio X 4       sennosides-docusate sodium (SENOKOT-S) 8.6-50 MG tablet Take 2 tablets by mouth daily.       triamcinolone (KENALOG) 0.147 MG/GM topical  spray Apply 2-3 sprays topically 3 (three) times a week.        No current facility-administered medications for this visit.      REVIEW OF SYSTEMS:  '[X]'$  denotes positive finding, '[ ]'$  denotes negative finding Cardiac   Comments:  Chest pain or chest pressure:      Shortness of breath upon exertion:      Short of breath when lying flat:      Irregular heart rhythm:             Vascular      Pain in calf, thigh, or hip brought on by ambulation:      Pain in feet at night that wakes you up from your sleep:       Blood clot in your veins:      Leg swelling:  x Right         Pulmonary      Oxygen at home:      Productive cough:       Wheezing:              Neurologic      Sudden weakness in arms or legs:       Sudden numbness in arms or legs:       Sudden onset of difficulty speaking or slurred speech:      Temporary loss of vision in one eye:       Problems with dizziness:              Gastrointestinal      Blood in stool:       Vomited blood:              Genitourinary      Burning when urinating:       Blood in urine:             Psychiatric      Major depression:              Hematologic      Bleeding problems:      Problems with blood clotting too easily:             Skin      Rashes or ulcers:             Constitutional       Fever or chills:          PHYSICAL EXAM:    Vitals:    01/11/23 1411  BP: (!) 149/77  Pulse: 79  Resp: 16  Temp: 98 F (36.7 C)  TempSrc: Temporal  SpO2: 98%  Weight: 256 lb (116.1 kg)  Height: 5' 7.5" (1.715 m)      GENERAL: The patient is a well-nourished female, in no acute distress. The vital signs are documented above. CARDIAC: There is a regular rate and rhythm.  VASCULAR:  Palpable DP pulses bilaterally Notable right leg edema with larger circumference PULMONARY: No respiratory distress. ABDOMEN: Soft and non-tender. MUSCULOSKELETAL: There are no major deformities or cyanosis. NEUROLOGIC: No focal weakness or paresthesias are detected. SKIN: There are no ulcers or rashes noted. PSYCHIATRIC: The patient has a normal affect.   DATA:      EXAM: BILATERAL LOWER EXTREMITY VENOUS DOPPLER ULTRASOUND   TECHNIQUE: Gray-scale sonography with graded compression, as well as color Doppler and duplex ultrasound were performed to evaluate the lower extremity deep venous systems from the level of the common femoral vein and including the common femoral, femoral, profunda femoral,  popliteal and calf veins including the posterior tibial, peroneal and gastrocnemius veins when visible. The superficial great saphenous vein was also interrogated. Spectral Doppler was utilized to evaluate flow at rest and with distal augmentation maneuvers in the common femoral, femoral and popliteal veins.   COMPARISON:  None Available.   FINDINGS: RIGHT LOWER EXTREMITY   Common Femoral Vein: Nonocclusive mural thrombus present which is echogenic and has the appearance of chronic mural thrombus.   Saphenofemoral Junction: No evidence of thrombus. Normal compressibility and flow on color Doppler imaging.   Profunda Femoral Vein: No evidence of thrombus. Normal compressibility and flow on color Doppler imaging.   Femoral Vein: Thrombus present in the right femoral vein which appears  to be largely occlusive and likely more acute in appearance compared to the common femoral vein thrombus.   Popliteal Vein: The popliteal vein is very poorly visualized and not adequately assessed.   Calf Veins: Probable thrombus in the right posterior tibial vein. The peroneal vein is not visualized.   Superficial Great Saphenous Vein: No evidence of thrombus. Normal compressibility.   Venous Reflux:  None.   Other Findings: No evidence of superficial thrombophlebitis or abnormal fluid collection.   LEFT LOWER EXTREMITY   Common Femoral Vein: No evidence of thrombus. Normal compressibility, respiratory phasicity and response to augmentation.   Saphenofemoral Junction: No evidence of thrombus. Normal compressibility and flow on color Doppler imaging.   Profunda Femoral Vein: No evidence of thrombus. Normal compressibility and flow on color Doppler imaging.   Femoral Vein: No evidence of thrombus. Normal compressibility, respiratory phasicity and response to augmentation.   Popliteal Vein: No evidence of thrombus. Normal compressibility, respiratory phasicity and response to augmentation.   Calf Veins: No evidence of thrombus. Normal compressibility and flow on color Doppler imaging.   Superficial Great Saphenous Vein: The visualized great saphenous vein demonstrates thrombophlebitis up to nearly the level of the saphenofemoral junction.   Venous Reflux:  None.   Other Findings:  No abnormal fluid collections.   IMPRESSION: 1. Acute occlusive DVT in the right femoral vein and probable acute occlusive thrombus in the right posterior tibial vein. 2. 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 3. Superficial thrombophlebitis of the left great saphenous vein. 4. Poor visualization of the right popliteal vein and nonvisualization of the right peroneal vein.     Electronically Signed   By: Aletta Edouard M.D.   On: 12/24/2022  17:00   Assessment/Plan:   62 year old female with with hx morbid obesity, HTN, chronic back that presented with extensive right leg DVT in early January following hip surgery in November.  Duplex earlier this month showed thrombus in the right femoral and popliteal vein with more chronic appearing thrombus in the common femoral vein.  I would have expected her to see symptomatic improvement on anticoagulation alone especially with clot in her common femoral vein being nonocclusive.  Unfortunately she has had progression of symptoms on Eliquis now with significant pain and swelling in her right lower extremity particularly the thigh and groin.  I discussed after review of her duplex we could offer her a venous thrombectomy but this would require her being in the prone position for popliteal approach.  I am somewhat concerned about her ability to lay prone which I discussed with her today.  She wants to try given she feels she cannot tolerate the symptoms in her leg.  Discussed this being done with moderate sedation in the Cath Lab.  I  discussed the clot would be retrieved with percutaneous mechanical thrombectomy to alleviate her symptoms and prevent post thrombotic syndrome.  I discussed if the clot is too organized we may be unable to traverse this with a wire and catheter and then unsuccessful in trying to retrieve the clot.  Also if she cannot lay prone will have to stop which she understands.  Risks benefits discussed including risk of thromboembolism.     Marty Heck, MD [facility] Office: [phone]

## 2023-01-14 ENCOUNTER — Telehealth: Payer: Self-pay | Admitting: Vascular Surgery

## 2023-01-14 NOTE — Telephone Encounter (Signed)
-----   Message from Marty Heck, MD sent at 01/13/2023 12:32 PM EST ----- Can you schedule patient for 1 month follow-up in [place] [facility] for right leg DVT?  Thanks,  Gerald Stabs

## 2023-02-10 ENCOUNTER — Ambulatory Visit (HOSPITAL_COMMUNITY): Payer: Medicare Other

## 2023-02-15 ENCOUNTER — Ambulatory Visit (INDEPENDENT_AMBULATORY_CARE_PROVIDER_SITE_OTHER): Payer: Medicare Other | Admitting: Physician Assistant

## 2023-02-15 VITALS — BP 138/73 | HR 78 | Temp 97.6°F | Resp 20 | Ht 68.0 in

## 2023-02-15 DIAGNOSIS — I82401 Acute embolism and thrombosis of unspecified deep veins of right lower extremity: Secondary | ICD-10-CM | POA: Diagnosis not present

## 2023-02-15 DIAGNOSIS — I82411 Acute embolism and thrombosis of right femoral vein: Secondary | ICD-10-CM | POA: Diagnosis not present

## 2023-02-15 NOTE — Progress Notes (Signed)
Office Note     CC:  follow up Requesting Provider:  Rogers Blocker, MD  HPI: Crystal [name] is a 62 y.o. (11/23/1961) female who presents for evaluation of right lower extremity DVT.  She was diagnosed with PE and DVT into the right common femoral vein.  Due to the degree of symptoms, she was offered a mechanical thrombectomy by Dr. Carlis Abbott.  On the day of her procedure on 01/23/2023, she was unable to lie prone and thus the procedure was canceled.  Right at the time Dr. Carlis Abbott did ultrasound her common femoral vein which appeared to be patent with no evidence of DVT involving the external iliac vein as well.  She returns today and states the symptoms of her right leg including pain, swelling, tightness, burning, heaviness have not improved.  She is wearing her 20 to 30 mmHg thigh-high compression.  She states she is making an effort to elevate her legs above the level of her heart during the day.  She was initially on Xarelto for 21 days however switched to Eliquis and has been on Eliquis since that time.  She is seen today in a motorized wheelchair however states she is able to use her walker at home.  She is accompanied today by her husband   Past Medical History:  Diagnosis Date   Asthma    Activity induced; improved , reports 08-06-2019 breathing now stable per patient report    Chronic fatigue syndrome    Chronic myofascial pain 1997   Dr Jason Fila   Complication of anesthesia    pseudocolonesterase deficiency   DDD (degenerative disc disease), lumbar [phone]   Dr Jenny Reichmann Rendall   Diabetes mellitus    type 2 , checks daily before breakfast     Endometriosis    Fibromyalgia 1997   Dr Jason Fila   Hypothyroidism    IBS (irritable bowel syndrome)    Interstitial cystitis    Dr Alona Bene   Morbid obesity [facility])    Osteoarthritis of lumbar spine 2018   Dr Wandra Feinstein   Peripheral neuropathy    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[place])    hadnt had sx of this in over a year    Reflex sympathetic  dystrophy of right lower extremity 2002   Right toe- Dr Jason Fila;  "hasnt bothered me in a few years"    Seizures ([place])    last seizure was about 8 months ago, unsure what triggered it , say she was hospitalized at [facility] but encounter not seen in epic , does not take any seizure medication at this time , denies reoccurrence of seizure episodes since that time    Sleep apnea    no device at this time   Wears eyeglasses    Wheelchair dependent     Past Surgical History:  Procedure Laterality Date   [place]- Dr Kendall Flack   ABDOMINAL SURGERY     BLADDER SURGERY  1997   Bladder distention, [place] [facility] (1998, 2003, 2004, 2006, 2007, 2008, 2009, 2012, 2013)( [facility]-1999, 2001, 2002) [facility] (2015, 2017[facility]   BUNIONECTOMY Right 2001   [facility]- Dr Melrose Nakayama   CARDIAC CATHETERIZATION  "some year ago i cant remember"    reports she was having chest pain at the time but says cath was clean     CHOLECYSTECTOMY  1987   [facility], Dr Werner Lean   COLONOSCOPY WITH PROPOFOL N/A 08/07/2019   Procedure: COLONOSCOPY WITH PROPOFOL;  Surgeon:  Wilford Corner, MD;  Location: [facility];  Service: Endoscopy;  Laterality: N/A;   GYNECOLOGIC CRYOSURGERY  1983   Dr Oleh Genin office   JOINT REPLACEMENT     denies    KNEE ARTHROSCOPY Left 1991   [facility]- Dr Oretha Caprice   KNEE SURGERY     denies    POLYPECTOMY  08/07/2019   Procedure: POLYPECTOMY;  Surgeon: Wilford Corner, MD;  Location: [facility];  Service: Endoscopy;;   PUBOVAGINAL [place] Right 10/27/2022   Procedure: TOTAL HIP ARTHROPLASTY ANTERIOR APPROACH;  Surgeon: [name], MD;  Location: [facility];  Service: Orthopedics;  Laterality: Right;   TUBAL LIGATION  1990   [facility]- Dr Kendall Flack    Social History   Socioeconomic History   Marital status: Married    Spouse name: Not on file   Number of children: Not on file   Years of  education: Not on file   Highest education level: Not on file  Occupational History   Not on file  Tobacco Use   Smoking status: Never    Passive exposure: Never   Smokeless tobacco: Never  Vaping Use   Vaping Use: Never used  Substance and Sexual Activity   Alcohol use: No   Drug use: Never   Sexual activity: Yes    Birth control/protection: Surgical  Other Topics Concern   Not on file  Social History Narrative   Lives   Caffeine use:    Right handed    Social Determinants of Health   Financial Resource Strain: Not on file  Food Insecurity: No Food Insecurity (12/25/2022)   Hunger Vital Sign    Worried About Running Out of Food in the Last Year: Never true    Ran Out of Food in the Last Year: Never true  Transportation Needs: No Transportation Needs (12/25/2022)   PRAPARE - Hydrologist (Medical): No    Lack of Transportation (Non-Medical): No  Physical Activity: Not on file  Stress: Not on file  Social Connections: Not on file  Intimate Partner Violence: Not At Risk (12/25/2022)   Humiliation, Afraid, Rape, and Kick questionnaire    Fear of Current or Ex-Partner: No    Emotionally Abused: No    Physically Abused: No    Sexually Abused: No   History reviewed. No pertinent family history.  Current Outpatient Medications  Medication Sig Dispense Refill   acetaminophen (TYLENOL) 650 MG CR tablet Take 1,300 mg by mouth See admin instructions. Take 2 tablets (1300 mg) by mouth scheduled at midday & may take 2 additional doses (1300 mg) by mouth if needed for pain.     acyclovir (ZOVIRAX) 400 MG tablet Take 400 mg by mouth 2 (two) times daily.     albuterol (PROVENTIL HFA;VENTOLIN HFA) 108 (90 BASE) MCG/ACT inhaler Inhale 2 puffs into the lungs every 6 (six) hours as needed for shortness of breath.      apixaban (ELIQUIS) 5 MG TABS tablet Take 1 tablet (5 mg total) by mouth 2 (two) times daily. 60 tablet 1   baclofen (LIORESAL) 20 MG tablet Take  20 mg by mouth 3 (three) times daily.      betamethasone, augmented, (DIPROLENE) 0.05 % lotion Apply topically.     Cholecalciferol (VITAMIN D3) 50 MCG (2000 UT) capsule Take 2,000 Units by mouth in the morning.     clotrimazole-betamethasone (LOTRISONE) cream Apply topically 2 (  two) times daily.     Cod Liver Oil CAPS Take 1 capsule by mouth every evening.     docusate sodium (COLACE) 100 MG capsule Take 100 mg by mouth in the morning and at bedtime.     doxycycline (VIBRAMYCIN) 100 MG capsule Take by mouth.     EPINEPHrine (EPI-PEN) 0.3 mg/0.3 mL DEVI Inject 0.3 mg into the muscle once as needed (anaphylaxis).      fexofenadine (ALLEGRA) 180 MG tablet Take 180 mg by mouth in the morning.     glucose 4 GM chewable tablet Chew 1 tablet by mouth as needed for low blood sugar.     ipratropium-albuterol (DUONEB) 0.5-2.5 (3) MG/3ML SOLN Take 3 mLs by nebulization every 4 (four) hours as needed (asthma).      methadone (DOLOPHINE) 10 MG tablet Take 10 mg by mouth See admin instructions. Take 1 tablet (10 mg ) by mouth scheduled twice daily (morning & evening) & may 0.5-1 tablet (5-'10mg'$ ) in  the afternoon if needed for pain.     methocarbamol (ROBAXIN) 500 MG tablet Take 1 tablet (500 mg total) by mouth every 6 (six) hours as needed for muscle spasms. 40 tablet 0   Misc Natural Products (COLON-AID PO) Take 1 tablet by mouth 3 (three) times daily.     nitroGLYCERIN (NITROSTAT) 0.4 MG SL tablet Place 0.4 mg under the tongue every 5 (five) minutes x 3 doses as needed for chest pain.     nystatin (MYCOSTATIN/NYSTOP) powder Apply 1 Application topically 2 (two) times daily as needed (rash/skin irritation.).     omeprazole (PRILOSEC) 40 MG capsule Take 40 mg by mouth daily as needed (acid reflux/indigestion.).     potassium chloride (K-DUR,KLOR-CON) 10 MEQ tablet Take 20 mEq by mouth 2 (two) times daily.      Probiotic Product (PROBIOTIC PO) Take 1 capsule by mouth 3 (three) times daily. Bio X 4     senna  (SENOKOT) 8.6 MG tablet Take 2 tablets by mouth daily in the afternoon.     senna-docusate (COLACE 2-IN-1) 8.6-50 MG tablet Take 1 tablet by mouth in the morning and at bedtime.     Sennosides (SENOKOT EXTRA STRENGTH) 17.2 MG TABS Take 2 tablets by mouth in the morning.     SYNTHROID 50 MCG tablet Take 50 mcg by mouth daily before breakfast.     triamcinolone (KENALOG) 0.147 MG/GM topical spray Apply 2-3 sprays topically 3 (three) times a week. (Patient not taking: Reported on 02/15/2023)     No current facility-administered medications for this visit.    Allergies  Allergen Reactions   Amoxicillin     Severe yeast infection causing lesions and rawness   Celery (Apium Graveolens Var. Dulce) Skin Test     Numbness and tingling, bitter taste and prickling on the tongue   Flagyl [Metronidazole] Itching and Swelling   Morphine Nausea And Vomiting   Nsaids Nausea Only    Headaches   Other Other (See Comments)    Metals: Nickel, cat dander, dog dander, hickery, ash, walnut, lambs quarter, dust, ragweed, dustmites, difar, sawdust, tar   Finasteride Itching, Swelling and Rash    redness   Sulfonamide Derivatives Itching and Rash    Blisters in throat      REVIEW OF SYSTEMS:   '[X]'$  denotes positive finding, '[ ]'$  denotes negative finding Cardiac  Comments:  Chest pain or chest pressure:    Shortness of breath upon exertion:    Short of breath when lying flat:  Irregular heart rhythm:        Vascular    Pain in calf, thigh, or hip brought on by ambulation:    Pain in feet at night that wakes you up from your sleep:     Blood clot in your veins:    Leg swelling:         Pulmonary    Oxygen at home:    Productive cough:     Wheezing:         Neurologic    Sudden weakness in arms or legs:     Sudden numbness in arms or legs:     Sudden onset of difficulty speaking or slurred speech:    Temporary loss of vision in one eye:     Problems with dizziness:         Gastrointestinal     Blood in stool:     Vomited blood:         Genitourinary    Burning when urinating:     Blood in urine:        Psychiatric    Major depression:         Hematologic    Bleeding problems:    Problems with blood clotting too easily:        Skin    Rashes or ulcers:        Constitutional    Fever or chills:      PHYSICAL EXAMINATION:  Vitals:   02/15/23 1427  BP: 138/73  Pulse: 78  Resp: 20  Temp: 97.6 F (36.4 C)  TempSrc: Temporal  SpO2: 99%  Height: '5\' 8"'$  (1.727 m)    General:  WDWN in NAD; vital signs documented above Gait: Not observed HENT: WNL, normocephalic Pulmonary: normal non-labored breathing , without Rales, rhonchi,  wheezing Cardiac: regular HR Abdomen: soft, NT, no masses Skin: without rashes Vascular Exam/Pulses:  Right Left  Radial 2+ (normal) 2+ (normal)  DP 2+ (normal) 2+ (normal)   Extremities: without ischemic changes, without Gangrene , without cellulitis; without open wounds;  Musculoskeletal: no muscle wasting or atrophy  Neurologic: A&O X 3;  No focal weakness or paresthesias are detected Psychiatric:  The pt has Normal affect.   Non-Invasive Vascular Imaging:   none    ASSESSMENT/PLAN:: 62 y.o. female here for follow up for right leg DVT  -Ms. Donaldeen Hlinka was diagnosed with a DVT of the right leg in January of this year.  Thrombus extended into the common femoral vein of the right lower extremity however was nonocclusive at this level.  Given the degree of her venous symptoms she was offered a percutaneous mechanical thrombectomy by Dr. Carlis Abbott however was unable to tolerate laying prone, thus the procedure was canceled.  She returns today and states her symptoms have not improved.  Case was discussed with Dr. Carlis Abbott.  Patient will continue her Eliquis.  We will check a CT venogram to make sure there has not been any thrombus propagation.  She will follow-up with these results with Dr. Carlis Abbott.  She will continue to use her thigh-high  compression.  She will continue to elevate her legs is much as possible during the day.  I have encouraged her to continue her work with physical therapy to increase her mobility as this will promote collateral flow and improve venous return.  She knows to call/return office sooner with any questions or concerns.   Dagoberto Ligas, PA-C [facility] [phone]

## 2023-02-16 ENCOUNTER — Other Ambulatory Visit: Payer: Self-pay

## 2023-02-16 DIAGNOSIS — I82401 Acute embolism and thrombosis of unspecified deep veins of right lower extremity: Secondary | ICD-10-CM

## 2023-02-24 ENCOUNTER — Ambulatory Visit (HOSPITAL_COMMUNITY)
Admission: RE | Admit: 2023-02-24 | Discharge: 2023-02-24 | Disposition: A | Discharge status: Home / Self Care | Payer: Medicare Other | Source: Ambulatory Visit | Attending: Vascular Surgery | Admitting: Vascular Surgery

## 2023-02-24 ENCOUNTER — Other Ambulatory Visit: Payer: Self-pay | Admitting: Vascular Surgery

## 2023-02-24 DIAGNOSIS — I82401 Acute embolism and thrombosis of unspecified deep veins of right lower extremity: Secondary | ICD-10-CM

## 2023-02-24 DIAGNOSIS — E119 Type 2 diabetes mellitus without complications: Secondary | ICD-10-CM | POA: Insufficient documentation

## 2023-02-24 MED ORDER — IOHEXOL 350 MG/ML SOLN
100.0000 mL | Freq: Once | INTRAVENOUS | Status: AC | PRN
  Administered 2023-02-24: 100 mL via INTRAVENOUS

## 2023-03-01 ENCOUNTER — Ambulatory Visit (INDEPENDENT_AMBULATORY_CARE_PROVIDER_SITE_OTHER): Payer: Medicare Other | Admitting: Vascular Surgery

## 2023-03-01 VITALS — BP 139/81 | HR 73 | Temp 98.1°F | Resp 16 | Ht 68.0 in | Wt 265.6 lb

## 2023-03-01 DIAGNOSIS — I82411 Acute embolism and thrombosis of right femoral vein: Secondary | ICD-10-CM | POA: Diagnosis not present

## 2023-03-01 NOTE — Progress Notes (Signed)
Patient name: Crystal [name] MRN: [id number] DOB: [birth year] Sex: female  REASON FOR CONSULT: F/U after CT venogram abdomen pelvis  HPI: Crystal [name] is a 62 y.o. female, with hx morbid obesity, HTN, chronic back pain that presents for follow-up after CT venogram abdomen pelvis for further evaluation of right leg DVT.  She was previously seen with a right leg DVT.  She had right hip surgery in November on 10/27/22. She then ultimately was diagnosed with a DVT in early January 2024 and started on Eliquis.   I did take her for attempted percutaneous thrombectomy in the Cath Lab given symptom progression on anticoagulation but she was unable to lay prone or on her side due to recent hip surgery with chronic back pain.  She was seen by her PA in follow-up and sent for a CT venogram abdomen pelvis due to ongoing symptoms.  Today she states her right leg is doing a little bit better.  She is wearing thigh-high compression stockings.  Tolerating her Eliquis.  Past Medical History:  Diagnosis Date   Asthma    Activity induced; improved , reports 08-06-2019 breathing now stable per patient report    Chronic fatigue syndrome    Chronic myofascial pain 1997   Dr Jason Fila   Complication of anesthesia    pseudocolonesterase deficiency   DDD (degenerative disc disease), lumbar [phone]   Dr Jenny Reichmann Rendall   Diabetes mellitus    type 2 , checks daily before breakfast     Endometriosis    Fibromyalgia 1997   Dr Jason Fila   Hypothyroidism    IBS (irritable bowel syndrome)    Interstitial cystitis    Dr Alona Bene   Morbid obesity [facility])    Osteoarthritis of lumbar spine 2018   Dr Wandra Feinstein   Peripheral neuropathy    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[place])    hadnt had sx of this in over a year    Reflex sympathetic dystrophy of right lower extremity 2002   Right toe- Dr Jason Fila;  "hasnt bothered me in a few years"    Seizures ([place])    last seizure was about 8 months ago, unsure what  triggered it , say she was hospitalized at [facility] but encounter not seen in epic , does not take any seizure medication at this time , denies reoccurrence of seizure episodes since that time    Sleep apnea    no device at this time   Wears eyeglasses    Wheelchair dependent     Past Surgical History:  Procedure Laterality Date   [place]- Dr Kendall Flack   ABDOMINAL SURGERY     BLADDER SURGERY  1997   Bladder distention, [place] (1998, 2003, 2004, 2006, 2007, 2008, 2009, 2012, 2013)( [facility]-1999, 2001, 2002) [facility] (2015, 2017,)   BUNIONECTOMY Right 2001   [facility]- Dr Melrose Nakayama   CARDIAC CATHETERIZATION  "some year ago i cant remember"    reports she was having chest pain at the time but says cath was clean     CHOLECYSTECTOMY  1987   [facility], Dr Werner Lean   COLONOSCOPY WITH PROPOFOL N/A 08/07/2019   Procedure: COLONOSCOPY WITH PROPOFOL;  Surgeon: Wilford Corner, MD;  [facility];  Service: Endoscopy;  Laterality: N/A;   GYNECOLOGIC CRYOSURGERY  1983   Dr Oleh Genin office   JOINT REPLACEMENT     denies    KNEE ARTHROSCOPY Left 1991   [facility]- Dr  John Rendall   KNEE SURGERY     denies    POLYPECTOMY  08/07/2019   Procedure: POLYPECTOMY;  Surgeon: Wilford Corner, MD;  [facility];  Service: Endoscopy;;   PUBOVAGINAL [place] Right 10/27/2022   Procedure: TOTAL HIP ARTHROPLASTY ANTERIOR APPROACH;  Surgeon: [name], MD;  Location[facility];  Service: Orthopedics;  Laterality: Right;   TUBAL LIGATION  1990   [facility]- Dr Kendall Flack    No family history on file.  SOCIAL HISTORY: Social History   Socioeconomic History   Marital status: Married    Spouse name: Not on file   Number of children: Not on file   Years of education: Not on file   Highest education level: Not on file  Occupational History   Not on file  Tobacco Use   Smoking status:  Never    Passive exposure: Never   Smokeless tobacco: Never  Vaping Use   Vaping Use: Never used  Substance and Sexual Activity   Alcohol use: No   Drug use: Never   Sexual activity: Yes    Birth control/protection: Surgical  Other Topics Concern   Not on file  Social History Narrative   Lives   Caffeine use:    Right handed    Social Determinants of Health   Financial Resource Strain: Not on file  Food Insecurity: No Food Insecurity (12/25/2022)   Hunger Vital Sign    Worried About Running Out of Food in the Last Year: Never true    Ran Out of Food in the Last Year: Never true  Transportation Needs: No Transportation Needs (12/25/2022)   PRAPARE - Hydrologist (Medical): No    Lack of Transportation (Non-Medical): No  Physical Activity: Not on file  Stress: Not on file  Social Connections: Not on file  Intimate Partner Violence: Not At Risk (12/25/2022)   Humiliation, Afraid, Rape, and Kick questionnaire    Fear of Current or Ex-Partner: No    Emotionally Abused: No    Physically Abused: No    Sexually Abused: No    Allergies  Allergen Reactions   Amoxicillin     Severe yeast infection causing lesions and rawness   Celery (Apium Graveolens Var. Dulce) Skin Test     Numbness and tingling, bitter taste and prickling on the tongue   Flagyl [Metronidazole] Itching and Swelling   Morphine Nausea And Vomiting   Nsaids Nausea Only    Headaches   Other Other (See Comments)    Metals: Nickel, cat dander, dog dander, hickery, ash, walnut, lambs quarter, dust, ragweed, dustmites, difar, sawdust, tar   Finasteride Itching, Swelling and Rash    redness   Sulfonamide Derivatives Itching and Rash    Blisters in throat     Current Outpatient Medications  Medication Sig Dispense Refill   acetaminophen (TYLENOL) 650 MG CR tablet Take 1,300 mg by mouth See admin instructions. Take 2 tablets (1300 mg) by mouth scheduled at midday & may take 2  additional doses (1300 mg) by mouth if needed for pain.     acyclovir (ZOVIRAX) 400 MG tablet Take 400 mg by mouth 2 (two) times daily.     albuterol (PROVENTIL HFA;VENTOLIN HFA) 108 (90 BASE) MCG/ACT inhaler Inhale 2 puffs into the lungs every 6 (six) hours as needed for shortness of breath.      apixaban (ELIQUIS) 5 MG TABS  tablet Take 1 tablet (5 mg total) by mouth 2 (two) times daily. 60 tablet 1   baclofen (LIORESAL) 20 MG tablet Take 20 mg by mouth 3 (three) times daily.      betamethasone, augmented, (DIPROLENE) 0.05 % lotion Apply topically.     Cholecalciferol (VITAMIN D3) 50 MCG (2000 UT) capsule Take 2,000 Units by mouth in the morning.     clotrimazole-betamethasone (LOTRISONE) cream Apply topically 2 (two) times daily.     Cod Liver Oil CAPS Take 1 capsule by mouth every evening.     docusate sodium (COLACE) 100 MG capsule Take 100 mg by mouth in the morning and at bedtime.     doxycycline (VIBRAMYCIN) 100 MG capsule Take by mouth.     EPINEPHrine (EPI-PEN) 0.3 mg/0.3 mL DEVI Inject 0.3 mg into the muscle once as needed (anaphylaxis).      fexofenadine (ALLEGRA) 180 MG tablet Take 180 mg by mouth in the morning.     glucose 4 GM chewable tablet Chew 1 tablet by mouth as needed for low blood sugar.     ipratropium-albuterol (DUONEB) 0.5-2.5 (3) MG/3ML SOLN Take 3 mLs by nebulization every 4 (four) hours as needed (asthma).      methadone (DOLOPHINE) 10 MG tablet Take 10 mg by mouth See admin instructions. Take 1 tablet (10 mg ) by mouth scheduled twice daily (morning & evening) & may 0.5-1 tablet (5-10mg ) in  the afternoon if needed for pain.     methocarbamol (ROBAXIN) 500 MG tablet Take 1 tablet (500 mg total) by mouth every 6 (six) hours as needed for muscle spasms. 40 tablet 0   Misc Natural Products (COLON-AID PO) Take 1 tablet by mouth 3 (three) times daily.     nitroGLYCERIN (NITROSTAT) 0.4 MG SL tablet Place 0.4 mg under the tongue every 5 (five) minutes x 3 doses as needed for  chest pain.     nystatin (MYCOSTATIN/NYSTOP) powder Apply 1 Application topically 2 (two) times daily as needed (rash/skin irritation.).     omeprazole (PRILOSEC) 40 MG capsule Take 40 mg by mouth daily as needed (acid reflux/indigestion.).     potassium chloride (K-DUR,KLOR-CON) 10 MEQ tablet Take 20 mEq by mouth 2 (two) times daily.      Probiotic Product (PROBIOTIC PO) Take 1 capsule by mouth 3 (three) times daily. Bio X 4     senna (SENOKOT) 8.6 MG tablet Take 2 tablets by mouth daily in the afternoon.     senna-docusate (COLACE 2-IN-1) 8.6-50 MG tablet Take 1 tablet by mouth in the morning and at bedtime.     Sennosides (SENOKOT EXTRA STRENGTH) 17.2 MG TABS Take 2 tablets by mouth in the morning.     SYNTHROID 50 MCG tablet Take 50 mcg by mouth daily before breakfast.     triamcinolone (KENALOG) 0.147 MG/GM topical spray Apply 2-3 sprays topically 3 (three) times a week. (Patient not taking: Reported on 02/15/2023)     No current facility-administered medications for this visit.    REVIEW OF SYSTEMS:  [X]  denotes positive finding, [ ]  denotes negative finding Cardiac  Comments:  Chest pain or chest pressure:    Shortness of breath upon exertion:    Short of breath when lying flat:    Irregular heart rhythm:        Vascular    Pain in calf, thigh, or hip brought on by ambulation:    Pain in feet at night that wakes you up from your sleep:  Blood clot in your veins:    Leg swelling:  x Right      Pulmonary    Oxygen at home:    Productive cough:     Wheezing:         Neurologic    Sudden weakness in arms or legs:     Sudden numbness in arms or legs:     Sudden onset of difficulty speaking or slurred speech:    Temporary loss of vision in one eye:     Problems with dizziness:         Gastrointestinal    Blood in stool:     Vomited blood:         Genitourinary    Burning when urinating:     Blood in urine:        Psychiatric    Major depression:          Hematologic    Bleeding problems:    Problems with blood clotting too easily:        Skin    Rashes or ulcers:        Constitutional    Fever or chills:      PHYSICAL EXAM: There were no vitals filed for this visit.   GENERAL: The patient is a well-nourished female, in no acute distress. The vital signs are documented above. CARDIAC: There is a regular rate and rhythm.  VASCULAR:  Palpable DP pulses bilaterally Notable right leg edema with larger circumference PULMONARY: No respiratory distress. ABDOMEN: Soft and non-tender. MUSCULOSKELETAL: There are no major deformities or cyanosis. NEUROLOGIC: No focal weakness or paresthesias are detected. SKIN: There are no ulcers or rashes noted. PSYCHIATRIC: The patient has a normal affect.  DATA:     CT venogram abdomen pelvis 02/25/2023 is negative for any evidence of acute or chronic DVT of the IVC or iliac veins with no proximal extension.  The common femoral veins that are visualized also have no identifiable thrombus.  Assessment/Plan:  62 year old female with with hx morbid obesity, HTN, chronic back that presents for evaluation after CT venogram abdomen pelvis.  Patient was previously seen with right leg DVT diagnosed in January 2024 following right hip surgery in November.  We took her to the Cath Lab on 01/13/23 for attempted percutaneous mechanical thrombectomy (due to symptom progression on anticoagulation) but due to her chronic back pain she could not lay prone or on her side.  She was sent for CT venogram abdomen pelvis by the PA after she had ongoing symptoms on recent hospital follow-up.  I discussed that her CT venogram shows no evidence of IVC or iliac vein thrombus.  The visualized portions of the femoral vein are also patent.  I discussed without proximal extension there is no indication for further attempted surgical intervention.  She needs 6 months of anticoagulation and she is tolerating Eliquis for first-time  provoked DVT after recent hip surgery.  I discussed elevation and compression.  She is wearing thigh-high compression.  She can follow-up with me as needed.      Marty Heck, MD [facility] [place] Office: [phone]

## 2023-03-02 LAB — POCT I-STAT CREATININE: Creatinine, Ser: 1.4 mg/dL — ABNORMAL HIGH (ref 0.44–1.00)

## 2023-04-19 ENCOUNTER — Telehealth: Payer: Self-pay

## 2023-04-19 ENCOUNTER — Other Ambulatory Visit: Payer: Self-pay | Admitting: *Deleted

## 2023-04-19 DIAGNOSIS — M79604 Pain in right leg: Secondary | ICD-10-CM

## 2023-04-19 NOTE — Telephone Encounter (Signed)
Pt called with concerns about another DVT. She states for 2 weeks she has had a lot of RLE swelling with pain behind her knee. She notices warmth. She has been scheduled for a DVT u/s tomorrow and to f/u with APP the next day.

## 2023-04-20 ENCOUNTER — Ambulatory Visit (HOSPITAL_COMMUNITY)
Admission: RE | Admit: 2023-04-20 | Discharge: 2023-04-20 | Disposition: A | Discharge status: Home / Self Care | Payer: Medicare Other | Source: Ambulatory Visit | Attending: Vascular Surgery | Admitting: Vascular Surgery

## 2023-04-20 DIAGNOSIS — M79604 Pain in right leg: Secondary | ICD-10-CM | POA: Insufficient documentation

## 2023-04-21 ENCOUNTER — Telehealth: Payer: Self-pay

## 2023-04-21 ENCOUNTER — Ambulatory Visit: Payer: Medicare Other

## 2023-04-21 NOTE — Telephone Encounter (Signed)
Called pt to let her know her DVT study was negative, per APP. She is frustrated and still is having RLE pain/swelling. She is asking about reflux and has been looking into that. She has not had a reflux study, so she is going to return for that tomorrow and then will f/u with MD next week. No further questions/concerns at this time.

## 2023-04-22 ENCOUNTER — Encounter (HOSPITAL_COMMUNITY): Payer: Self-pay

## 2023-04-22 ENCOUNTER — Other Ambulatory Visit: Payer: Self-pay | Admitting: *Deleted

## 2023-04-22 ENCOUNTER — Ambulatory Visit (HOSPITAL_COMMUNITY)
Admission: RE | Admit: 2023-04-22 | Discharge: 2023-04-22 | Disposition: A | Discharge status: Home / Self Care | Payer: Medicare Other | Source: Ambulatory Visit | Attending: Vascular Surgery | Admitting: Vascular Surgery

## 2023-04-22 DIAGNOSIS — I82411 Acute embolism and thrombosis of right femoral vein: Secondary | ICD-10-CM

## 2023-04-22 DIAGNOSIS — M79604 Pain in right leg: Secondary | ICD-10-CM

## 2023-04-22 NOTE — Progress Notes (Signed)
Attempted lower extremity reflux exam, however patient is unable to lie flat on the exam table. Per patient, has had her back fused and must sit upright. Spoke with Dr. Karin Lieu who confirmed it is appropriate to d/c this exam. Patient will return 04/26/23 for her appointment with Dr. Chestine Spore.  04/22/2023 3:50 PM Eula Fried MHA, RVT, RDCS, RDMS

## 2023-04-25 ENCOUNTER — Telehealth: Payer: Self-pay | Admitting: Physician Assistant

## 2023-04-25 NOTE — Telephone Encounter (Signed)
Appt has been canceled.

## 2023-04-25 NOTE — Telephone Encounter (Signed)
-----   Message from Emilie Rutter, PA-C sent at 04/25/2023  8:45 AM EDT ----- This appt for PA 5/14 can be cancelled since she did not have her reflux study on 5/10.  Thank you.  Dr. Chestine Spore also mentioned he has nothing to offer her so she can probably just follow up as needed

## 2023-04-26 ENCOUNTER — Ambulatory Visit: Payer: Medicare Other

## 2023-04-26 ENCOUNTER — Ambulatory Visit: Payer: Medicare Other | Admitting: Vascular Surgery

## 2024-02-22 ENCOUNTER — Other Ambulatory Visit: Payer: Self-pay | Admitting: Orthopedic Surgery

## 2024-02-22 ENCOUNTER — Other Ambulatory Visit (HOSPITAL_COMMUNITY): Payer: Self-pay | Admitting: Orthopedic Surgery

## 2024-02-22 DIAGNOSIS — M25551 Pain in right hip: Secondary | ICD-10-CM

## 2024-02-22 DIAGNOSIS — Z96641 Presence of right artificial hip joint: Secondary | ICD-10-CM

## 2024-02-28 ENCOUNTER — Ambulatory Visit (HOSPITAL_COMMUNITY): Admission: RE | Admit: 2024-02-28 | Source: Ambulatory Visit

## 2024-02-28 ENCOUNTER — Encounter (HOSPITAL_COMMUNITY): Payer: Self-pay

## 2024-02-28 ENCOUNTER — Ambulatory Visit (HOSPITAL_COMMUNITY)
